# Patient Record
Sex: Male | Born: 1940 | Race: White | Hispanic: No | Smoking: Never smoker
Health system: Southern US, Community
[De-identification: ages and names within clinical notes are randomized; demographics above are authoritative.]

## PROBLEM LIST (undated history)

## (undated) DIAGNOSIS — E785 Hyperlipidemia, unspecified: Secondary | ICD-10-CM

## (undated) DIAGNOSIS — Z9289 Personal history of other medical treatment: Secondary | ICD-10-CM

## (undated) DIAGNOSIS — I719 Aortic aneurysm of unspecified site, without rupture: Secondary | ICD-10-CM

## (undated) DIAGNOSIS — I4892 Unspecified atrial flutter: Secondary | ICD-10-CM

## (undated) DIAGNOSIS — M1990 Unspecified osteoarthritis, unspecified site: Secondary | ICD-10-CM

## (undated) DIAGNOSIS — I4891 Unspecified atrial fibrillation: Secondary | ICD-10-CM

## (undated) DIAGNOSIS — R918 Other nonspecific abnormal finding of lung field: Secondary | ICD-10-CM

## (undated) DIAGNOSIS — Q87410 Marfan's syndrome with aortic dilation: Secondary | ICD-10-CM

## (undated) DIAGNOSIS — I10 Essential (primary) hypertension: Secondary | ICD-10-CM

## (undated) HISTORY — DX: Hyperlipidemia, unspecified: E78.5

## (undated) HISTORY — DX: Marfan syndrome with aortic dilation: Q87.410

## (undated) HISTORY — DX: Unspecified atrial fibrillation: I48.91

## (undated) HISTORY — DX: Essential (primary) hypertension: I10

## (undated) HISTORY — DX: Aortic aneurysm of unspecified site, without rupture: I71.9

## (undated) HISTORY — DX: Unspecified osteoarthritis, unspecified site: M19.90

## (undated) HISTORY — DX: Unspecified atrial flutter: I48.92

## (undated) HISTORY — DX: Other nonspecific abnormal finding of lung field: R91.8

## (undated) HISTORY — PX: VASCULAR SURGERY: SHX849

## (undated) HISTORY — PX: CORONARY ARTERY BYPASS GRAFT: SHX141

## (undated) HISTORY — PX: CARDIAC VALVE REPLACEMENT: SHX585

## (undated) HISTORY — PX: KIDNEY SURGERY: SHX687

---

## 1992-03-09 DIAGNOSIS — I719 Aortic aneurysm of unspecified site, without rupture: Secondary | ICD-10-CM

## 1992-03-09 HISTORY — DX: Aortic aneurysm of unspecified site, without rupture: I71.9

## 1992-03-09 HISTORY — PX: HIP FRACTURE SURGERY: SHX118

## 1993-03-09 HISTORY — PX: AORTIC VALVE REPLACEMENT: SHX41

## 1997-10-01 ENCOUNTER — Ambulatory Visit (HOSPITAL_COMMUNITY): Admission: RE | Admit: 1997-10-01 | Discharge: 1997-10-01 | Payer: Self-pay | Admitting: Cardiovascular Disease

## 2003-04-28 ENCOUNTER — Emergency Department (HOSPITAL_COMMUNITY): Admission: EM | Admit: 2003-04-28 | Discharge: 2003-04-28 | Payer: Self-pay | Admitting: Emergency Medicine

## 2003-08-23 ENCOUNTER — Encounter: Admission: RE | Admit: 2003-08-23 | Discharge: 2003-08-23 | Payer: Self-pay | Admitting: Family Medicine

## 2003-08-24 ENCOUNTER — Encounter: Admission: RE | Admit: 2003-08-24 | Discharge: 2003-08-24 | Payer: Self-pay | Admitting: Family Medicine

## 2003-09-25 ENCOUNTER — Encounter: Admission: RE | Admit: 2003-09-25 | Discharge: 2003-09-25 | Payer: Self-pay | Admitting: Family Medicine

## 2003-10-25 ENCOUNTER — Encounter: Admission: RE | Admit: 2003-10-25 | Discharge: 2003-10-25 | Payer: Self-pay | Admitting: Sports Medicine

## 2003-11-26 ENCOUNTER — Ambulatory Visit: Payer: Self-pay | Admitting: Family Medicine

## 2003-12-24 ENCOUNTER — Ambulatory Visit: Payer: Self-pay | Admitting: Sports Medicine

## 2004-01-24 ENCOUNTER — Ambulatory Visit: Payer: Self-pay | Admitting: Sports Medicine

## 2004-02-21 ENCOUNTER — Ambulatory Visit: Payer: Self-pay | Admitting: Family Medicine

## 2004-03-20 ENCOUNTER — Ambulatory Visit: Payer: Self-pay | Admitting: Family Medicine

## 2004-04-17 ENCOUNTER — Ambulatory Visit: Payer: Self-pay | Admitting: Family Medicine

## 2004-05-22 ENCOUNTER — Ambulatory Visit: Payer: Self-pay | Admitting: Family Medicine

## 2004-06-19 ENCOUNTER — Ambulatory Visit: Payer: Self-pay | Admitting: Sports Medicine

## 2004-07-15 ENCOUNTER — Ambulatory Visit: Payer: Self-pay | Admitting: Sports Medicine

## 2004-08-12 ENCOUNTER — Ambulatory Visit: Payer: Self-pay | Admitting: Family Medicine

## 2004-09-10 ENCOUNTER — Ambulatory Visit: Payer: Self-pay | Admitting: Family Medicine

## 2004-09-17 ENCOUNTER — Ambulatory Visit: Payer: Self-pay | Admitting: Family Medicine

## 2004-09-30 ENCOUNTER — Ambulatory Visit: Payer: Self-pay | Admitting: Sports Medicine

## 2004-10-28 ENCOUNTER — Ambulatory Visit: Payer: Self-pay | Admitting: Sports Medicine

## 2004-11-25 ENCOUNTER — Ambulatory Visit: Payer: Self-pay | Admitting: Sports Medicine

## 2004-12-23 ENCOUNTER — Ambulatory Visit: Payer: Self-pay | Admitting: Family Medicine

## 2005-01-08 ENCOUNTER — Ambulatory Visit: Payer: Self-pay | Admitting: Family Medicine

## 2005-01-13 ENCOUNTER — Ambulatory Visit: Payer: Self-pay | Admitting: Family Medicine

## 2005-01-27 ENCOUNTER — Ambulatory Visit: Payer: Self-pay | Admitting: Family Medicine

## 2005-02-24 ENCOUNTER — Ambulatory Visit: Payer: Self-pay | Admitting: Sports Medicine

## 2005-03-11 ENCOUNTER — Ambulatory Visit: Payer: Self-pay | Admitting: Family Medicine

## 2005-03-18 ENCOUNTER — Ambulatory Visit: Payer: Self-pay | Admitting: Family Medicine

## 2005-03-31 ENCOUNTER — Ambulatory Visit: Payer: Self-pay | Admitting: Sports Medicine

## 2005-04-21 ENCOUNTER — Ambulatory Visit: Payer: Self-pay | Admitting: Sports Medicine

## 2005-05-19 ENCOUNTER — Ambulatory Visit: Payer: Self-pay | Admitting: Family Medicine

## 2005-06-16 ENCOUNTER — Ambulatory Visit: Payer: Self-pay | Admitting: Family Medicine

## 2005-07-14 ENCOUNTER — Ambulatory Visit: Payer: Self-pay | Admitting: Family Medicine

## 2005-08-14 ENCOUNTER — Ambulatory Visit: Payer: Self-pay | Admitting: Family Medicine

## 2005-09-15 ENCOUNTER — Ambulatory Visit: Payer: Self-pay | Admitting: Sports Medicine

## 2005-09-30 ENCOUNTER — Ambulatory Visit: Payer: Self-pay | Admitting: Family Medicine

## 2005-10-15 ENCOUNTER — Encounter: Admission: RE | Admit: 2005-10-15 | Discharge: 2005-10-15 | Payer: Self-pay | Admitting: Sports Medicine

## 2005-10-15 ENCOUNTER — Ambulatory Visit: Payer: Self-pay | Admitting: Family Medicine

## 2005-11-01 HISTORY — PX: OTHER SURGICAL HISTORY: SHX169

## 2005-11-02 ENCOUNTER — Encounter: Admission: RE | Admit: 2005-11-02 | Discharge: 2005-11-02 | Payer: Self-pay | Admitting: Gastroenterology

## 2005-11-02 ENCOUNTER — Encounter: Payer: Self-pay | Admitting: Family Medicine

## 2005-11-12 ENCOUNTER — Ambulatory Visit: Payer: Self-pay | Admitting: Family Medicine

## 2005-11-26 ENCOUNTER — Ambulatory Visit: Payer: Self-pay | Admitting: Family Medicine

## 2005-12-07 ENCOUNTER — Ambulatory Visit: Payer: Self-pay | Admitting: Family Medicine

## 2005-12-15 ENCOUNTER — Ambulatory Visit: Payer: Self-pay | Admitting: Family Medicine

## 2005-12-15 ENCOUNTER — Ambulatory Visit (HOSPITAL_COMMUNITY): Admission: RE | Admit: 2005-12-15 | Discharge: 2005-12-15 | Payer: Self-pay | Admitting: Family Medicine

## 2005-12-16 ENCOUNTER — Ambulatory Visit: Payer: Self-pay | Admitting: Cardiology

## 2005-12-22 ENCOUNTER — Encounter: Payer: Self-pay | Admitting: Cardiology

## 2005-12-22 ENCOUNTER — Ambulatory Visit: Payer: Self-pay | Admitting: Cardiology

## 2005-12-22 ENCOUNTER — Ambulatory Visit (HOSPITAL_COMMUNITY): Admission: RE | Admit: 2005-12-22 | Discharge: 2005-12-22 | Payer: Self-pay | Admitting: Cardiology

## 2006-01-06 ENCOUNTER — Ambulatory Visit: Payer: Self-pay | Admitting: Family Medicine

## 2006-02-03 ENCOUNTER — Ambulatory Visit: Payer: Self-pay | Admitting: Family Medicine

## 2006-02-17 ENCOUNTER — Ambulatory Visit: Payer: Self-pay | Admitting: Sports Medicine

## 2006-03-04 ENCOUNTER — Ambulatory Visit: Payer: Self-pay | Admitting: Family Medicine

## 2006-03-26 ENCOUNTER — Ambulatory Visit: Payer: Self-pay | Admitting: Family Medicine

## 2006-04-23 ENCOUNTER — Ambulatory Visit: Payer: Self-pay | Admitting: Family Medicine

## 2006-05-21 ENCOUNTER — Ambulatory Visit: Payer: Self-pay | Admitting: Family Medicine

## 2006-05-21 LAB — CONVERTED CEMR LAB
INR: 2.6
Prothrombin Time: 31.4 s

## 2006-06-18 ENCOUNTER — Ambulatory Visit: Payer: Self-pay | Admitting: Family Medicine

## 2006-06-18 LAB — CONVERTED CEMR LAB

## 2006-07-22 ENCOUNTER — Ambulatory Visit: Payer: Self-pay | Admitting: Sports Medicine

## 2006-07-22 DIAGNOSIS — M1710 Unilateral primary osteoarthritis, unspecified knee: Secondary | ICD-10-CM | POA: Insufficient documentation

## 2006-07-22 DIAGNOSIS — E7800 Pure hypercholesterolemia, unspecified: Secondary | ICD-10-CM | POA: Insufficient documentation

## 2006-08-19 ENCOUNTER — Ambulatory Visit: Payer: Self-pay | Admitting: Family Medicine

## 2006-08-19 LAB — CONVERTED CEMR LAB: INR: 3.2

## 2006-09-16 ENCOUNTER — Ambulatory Visit: Payer: Self-pay | Admitting: Family Medicine

## 2006-10-14 ENCOUNTER — Ambulatory Visit: Payer: Self-pay | Admitting: Family Medicine

## 2006-10-14 ENCOUNTER — Encounter (INDEPENDENT_AMBULATORY_CARE_PROVIDER_SITE_OTHER): Payer: Self-pay | Admitting: Family Medicine

## 2006-10-14 LAB — CONVERTED CEMR LAB
Alkaline Phosphatase: 90 units/L (ref 39–117)
BUN: 18 mg/dL (ref 6–23)
CO2: 26 meq/L (ref 19–32)
Cholesterol: 177 mg/dL (ref 0–200)
Creatinine, Ser: 0.88 mg/dL (ref 0.40–1.50)
Glucose, Bld: 89 mg/dL (ref 70–99)
HCT: 46.4 % (ref 39.0–52.0)
HDL: 39 mg/dL — ABNORMAL LOW (ref >39)
Hemoglobin: 15.3 g/dL (ref 13.0–17.0)
LDL Cholesterol: 107 mg/dL — ABNORMAL HIGH (ref 0–99)
MCHC: 33 g/dL (ref 30.0–36.0)
MCV: 90.6 fL (ref 78.0–100.0)
RBC: 5.12 M/uL (ref 4.22–5.81)
Total Bilirubin: 0.8 mg/dL (ref 0.3–1.2)
Triglycerides: 155 mg/dL — ABNORMAL HIGH (ref <150)
VLDL: 31 mg/dL (ref 0–40)
WBC: 5.6 10*3/uL (ref 4.0–10.5)

## 2006-10-21 ENCOUNTER — Ambulatory Visit: Payer: Self-pay | Admitting: Family Medicine

## 2006-10-21 ENCOUNTER — Encounter: Admission: RE | Admit: 2006-10-21 | Discharge: 2006-10-21 | Payer: Self-pay | Admitting: Family Medicine

## 2006-11-05 ENCOUNTER — Ambulatory Visit: Payer: Self-pay | Admitting: Sports Medicine

## 2006-11-11 ENCOUNTER — Ambulatory Visit: Payer: Self-pay | Admitting: Family Medicine

## 2006-12-07 ENCOUNTER — Ambulatory Visit: Payer: Self-pay | Admitting: Family Medicine

## 2007-01-04 ENCOUNTER — Ambulatory Visit: Payer: Self-pay | Admitting: Family Medicine

## 2007-01-04 LAB — CONVERTED CEMR LAB: INR: 3.2

## 2007-01-18 ENCOUNTER — Ambulatory Visit: Payer: Self-pay | Admitting: Cardiology

## 2007-01-18 LAB — CONVERTED CEMR LAB
CO2: 32 meq/L (ref 19–32)
Chloride: 101 meq/L (ref 96–112)
Creatinine, Ser: 0.8 mg/dL (ref 0.4–1.5)
Potassium: 4.2 meq/L (ref 3.5–5.1)
Sodium: 139 meq/L (ref 135–145)

## 2007-01-20 ENCOUNTER — Ambulatory Visit (HOSPITAL_COMMUNITY): Admission: RE | Admit: 2007-01-20 | Discharge: 2007-01-20 | Payer: Self-pay | Admitting: Cardiology

## 2007-02-01 ENCOUNTER — Ambulatory Visit: Payer: Self-pay | Admitting: Family Medicine

## 2007-02-01 LAB — CONVERTED CEMR LAB: INR: 4.1

## 2007-02-15 ENCOUNTER — Ambulatory Visit: Payer: Self-pay | Admitting: Family Medicine

## 2007-02-15 LAB — CONVERTED CEMR LAB: INR: 3.4

## 2007-03-15 ENCOUNTER — Ambulatory Visit: Payer: Self-pay | Admitting: Family Medicine

## 2007-04-04 ENCOUNTER — Telehealth (INDEPENDENT_AMBULATORY_CARE_PROVIDER_SITE_OTHER): Payer: Self-pay | Admitting: Family Medicine

## 2007-04-12 ENCOUNTER — Ambulatory Visit: Payer: Self-pay | Admitting: Family Medicine

## 2007-04-12 LAB — CONVERTED CEMR LAB: INR: 3.1

## 2007-04-18 ENCOUNTER — Ambulatory Visit: Payer: Self-pay | Admitting: Family Medicine

## 2007-04-18 DIAGNOSIS — Q8740 Marfan's syndrome, unspecified: Secondary | ICD-10-CM

## 2007-05-11 ENCOUNTER — Ambulatory Visit: Payer: Self-pay | Admitting: Family Medicine

## 2007-05-11 LAB — CONVERTED CEMR LAB: INR: 3

## 2007-05-20 ENCOUNTER — Ambulatory Visit (HOSPITAL_COMMUNITY): Admission: RE | Admit: 2007-05-20 | Discharge: 2007-05-20 | Payer: Self-pay | Admitting: Family Medicine

## 2007-05-20 ENCOUNTER — Ambulatory Visit: Payer: Self-pay | Admitting: Family Medicine

## 2007-05-20 ENCOUNTER — Encounter (INDEPENDENT_AMBULATORY_CARE_PROVIDER_SITE_OTHER): Payer: Self-pay | Admitting: Family Medicine

## 2007-05-20 LAB — CONVERTED CEMR LAB: TSH: 1.147 microintl units/mL (ref 0.350–5.50)

## 2007-05-22 ENCOUNTER — Encounter (INDEPENDENT_AMBULATORY_CARE_PROVIDER_SITE_OTHER): Payer: Self-pay | Admitting: Family Medicine

## 2007-05-23 ENCOUNTER — Encounter (INDEPENDENT_AMBULATORY_CARE_PROVIDER_SITE_OTHER): Payer: Self-pay | Admitting: Family Medicine

## 2007-06-08 ENCOUNTER — Ambulatory Visit: Payer: Self-pay | Admitting: Family Medicine

## 2007-07-06 ENCOUNTER — Ambulatory Visit: Payer: Self-pay | Admitting: Family Medicine

## 2007-07-06 LAB — CONVERTED CEMR LAB: INR: 3.8

## 2007-07-27 ENCOUNTER — Encounter (INDEPENDENT_AMBULATORY_CARE_PROVIDER_SITE_OTHER): Payer: Self-pay | Admitting: Family Medicine

## 2007-08-03 ENCOUNTER — Encounter (INDEPENDENT_AMBULATORY_CARE_PROVIDER_SITE_OTHER): Payer: Self-pay | Admitting: Family Medicine

## 2007-08-03 ENCOUNTER — Ambulatory Visit: Payer: Self-pay | Admitting: Family Medicine

## 2007-08-03 LAB — CONVERTED CEMR LAB
BUN: 20 mg/dL (ref 6–23)
CO2: 23 meq/L (ref 19–32)
Calcium: 8.6 mg/dL (ref 8.4–10.5)
Chloride: 106 meq/L (ref 96–112)
Creatinine, Ser: 0.77 mg/dL (ref 0.40–1.50)
INR: 3.6

## 2007-08-08 ENCOUNTER — Ambulatory Visit: Payer: Self-pay | Admitting: Cardiovascular Disease

## 2007-08-24 ENCOUNTER — Ambulatory Visit: Payer: Self-pay | Admitting: Family Medicine

## 2007-08-26 ENCOUNTER — Ambulatory Visit: Payer: Self-pay | Admitting: Family Medicine

## 2007-08-31 ENCOUNTER — Ambulatory Visit: Payer: Self-pay | Admitting: Family Medicine

## 2007-09-14 ENCOUNTER — Ambulatory Visit: Payer: Self-pay | Admitting: Family Medicine

## 2007-10-12 ENCOUNTER — Ambulatory Visit: Payer: Self-pay | Admitting: Family Medicine

## 2007-11-09 ENCOUNTER — Ambulatory Visit: Payer: Self-pay | Admitting: Family Medicine

## 2007-12-07 ENCOUNTER — Ambulatory Visit: Payer: Self-pay | Admitting: Family Medicine

## 2007-12-07 LAB — CONVERTED CEMR LAB: INR: 3

## 2008-01-04 ENCOUNTER — Ambulatory Visit: Payer: Self-pay | Admitting: Family Medicine

## 2008-01-04 LAB — CONVERTED CEMR LAB: INR: 2.9

## 2008-02-01 ENCOUNTER — Ambulatory Visit: Payer: Self-pay | Admitting: Family Medicine

## 2008-02-20 ENCOUNTER — Ambulatory Visit: Payer: Self-pay | Admitting: Family Medicine

## 2008-02-21 ENCOUNTER — Telehealth: Payer: Self-pay | Admitting: *Deleted

## 2008-02-22 ENCOUNTER — Ambulatory Visit (HOSPITAL_COMMUNITY): Admission: RE | Admit: 2008-02-22 | Discharge: 2008-02-22 | Payer: Self-pay | Admitting: Family Medicine

## 2008-02-27 ENCOUNTER — Ambulatory Visit: Payer: Self-pay | Admitting: Family Medicine

## 2008-03-06 ENCOUNTER — Encounter: Payer: Self-pay | Admitting: Family Medicine

## 2008-03-06 ENCOUNTER — Ambulatory Visit: Payer: Self-pay | Admitting: Family Medicine

## 2008-03-06 LAB — CONVERTED CEMR LAB
ALT: 15 units/L (ref 0–53)
Albumin: 3.8 g/dL (ref 3.5–5.2)
CO2: 26 meq/L (ref 19–32)
Calcium: 8.8 mg/dL (ref 8.4–10.5)
Chloride: 107 meq/L (ref 96–112)
Cholesterol: 179 mg/dL (ref 0–200)
Folate: >20 ng/mL
Glucose, Bld: 93 mg/dL (ref 70–99)
INR: 2.2
PSA: 1.3 ng/mL (ref 0.10–4.00)
Platelets: 142 10*3/uL — ABNORMAL LOW (ref 150–400)
Potassium: 4.2 meq/L (ref 3.5–5.3)
RBC: 5.37 M/uL (ref 4.22–5.81)
Sodium: 140 meq/L (ref 135–145)
Total Protein: 6.9 g/dL (ref 6.0–8.3)
VLDL: 25 mg/dL (ref 0–40)
Vitamin B-12: 724 pg/mL (ref 211–911)
WBC: 5.2 10*3/uL (ref 4.0–10.5)

## 2008-03-21 ENCOUNTER — Ambulatory Visit: Payer: Self-pay | Admitting: Family Medicine

## 2008-03-21 LAB — CONVERTED CEMR LAB: INR: 3.1

## 2008-03-30 ENCOUNTER — Encounter: Payer: Self-pay | Admitting: Family Medicine

## 2008-04-03 ENCOUNTER — Encounter: Payer: Self-pay | Admitting: *Deleted

## 2008-04-18 ENCOUNTER — Ambulatory Visit: Payer: Self-pay | Admitting: Family Medicine

## 2008-05-14 ENCOUNTER — Ambulatory Visit: Payer: Self-pay | Admitting: Family Medicine

## 2008-05-14 LAB — CONVERTED CEMR LAB: INR: 3.3

## 2008-06-13 ENCOUNTER — Ambulatory Visit: Payer: Self-pay | Admitting: Family Medicine

## 2008-06-13 LAB — CONVERTED CEMR LAB: INR: 3.4

## 2008-07-11 ENCOUNTER — Ambulatory Visit: Payer: Self-pay | Admitting: Family Medicine

## 2008-07-11 LAB — CONVERTED CEMR LAB: INR: 2.9

## 2008-08-09 ENCOUNTER — Ambulatory Visit: Payer: Self-pay | Admitting: Family Medicine

## 2008-09-06 ENCOUNTER — Ambulatory Visit: Payer: Self-pay | Admitting: Family Medicine

## 2008-09-06 LAB — CONVERTED CEMR LAB: INR: 3.8

## 2008-09-26 ENCOUNTER — Ambulatory Visit: Payer: Self-pay | Admitting: Family Medicine

## 2008-09-26 LAB — CONVERTED CEMR LAB: INR: 3.9

## 2008-10-10 ENCOUNTER — Ambulatory Visit: Payer: Self-pay | Admitting: Family Medicine

## 2008-10-10 LAB — CONVERTED CEMR LAB: INR: 3.1

## 2008-10-29 ENCOUNTER — Encounter: Payer: Self-pay | Admitting: Family Medicine

## 2008-10-31 ENCOUNTER — Ambulatory Visit: Payer: Self-pay | Admitting: Family Medicine

## 2008-11-28 ENCOUNTER — Ambulatory Visit: Payer: Self-pay | Admitting: Family Medicine

## 2008-12-26 ENCOUNTER — Ambulatory Visit: Payer: Self-pay | Admitting: Family Medicine

## 2008-12-26 LAB — CONVERTED CEMR LAB: INR: 2.8

## 2009-01-23 ENCOUNTER — Ambulatory Visit: Payer: Self-pay | Admitting: Family Medicine

## 2009-01-23 LAB — CONVERTED CEMR LAB: INR: 2.5

## 2009-02-20 ENCOUNTER — Ambulatory Visit: Payer: Self-pay | Admitting: Family Medicine

## 2009-02-20 LAB — CONVERTED CEMR LAB: INR: 2.8

## 2009-03-20 ENCOUNTER — Ambulatory Visit: Payer: Self-pay | Admitting: Family Medicine

## 2009-03-20 LAB — CONVERTED CEMR LAB: INR: 2.7

## 2009-04-10 ENCOUNTER — Encounter: Payer: Self-pay | Admitting: Family Medicine

## 2009-04-10 ENCOUNTER — Ambulatory Visit: Payer: Self-pay | Admitting: Family Medicine

## 2009-04-10 DIAGNOSIS — M170 Bilateral primary osteoarthritis of knee: Secondary | ICD-10-CM

## 2009-04-10 LAB — CONVERTED CEMR LAB

## 2009-04-17 ENCOUNTER — Encounter: Payer: Self-pay | Admitting: Family Medicine

## 2009-04-17 ENCOUNTER — Ambulatory Visit: Payer: Self-pay | Admitting: Family Medicine

## 2009-04-17 LAB — CONVERTED CEMR LAB: INR: 2.7

## 2009-04-18 ENCOUNTER — Encounter: Payer: Self-pay | Admitting: Family Medicine

## 2009-04-18 LAB — CONVERTED CEMR LAB
ALT: 15 units/L (ref 0–53)
AST: 23 units/L (ref 0–37)
Alkaline Phosphatase: 91 units/L (ref 39–117)
Cholesterol: 177 mg/dL (ref 0–200)
Creatinine, Ser: 0.86 mg/dL (ref 0.40–1.50)
HCT: 46.7 % (ref 39.0–52.0)
MCHC: 31.7 g/dL (ref 30.0–36.0)
MCV: 93.6 fL (ref 78.0–100.0)
PSA: 1.96 ng/mL (ref 0.10–4.00)
Platelets: 150 10*3/uL (ref 150–400)
RDW: 13.9 % (ref 11.5–15.5)
Total Bilirubin: 0.7 mg/dL (ref 0.3–1.2)
Total CHOL/HDL Ratio: 4
VLDL: 43 mg/dL — ABNORMAL HIGH (ref 0–40)
Vit D, 25-Hydroxy: 31 ng/mL (ref 30–89)

## 2009-04-29 ENCOUNTER — Ambulatory Visit: Payer: Self-pay | Admitting: Cardiology

## 2009-04-29 DIAGNOSIS — I712 Thoracic aortic aneurysm, without rupture, unspecified: Secondary | ICD-10-CM | POA: Insufficient documentation

## 2009-04-29 DIAGNOSIS — I10 Essential (primary) hypertension: Secondary | ICD-10-CM | POA: Insufficient documentation

## 2009-04-29 DIAGNOSIS — I4891 Unspecified atrial fibrillation: Secondary | ICD-10-CM

## 2009-05-02 ENCOUNTER — Encounter: Payer: Self-pay | Admitting: Family Medicine

## 2009-05-06 ENCOUNTER — Ambulatory Visit: Payer: Self-pay | Admitting: Cardiology

## 2009-05-15 ENCOUNTER — Ambulatory Visit: Payer: Self-pay | Admitting: Family Medicine

## 2009-05-20 ENCOUNTER — Encounter: Payer: Self-pay | Admitting: Cardiology

## 2009-05-20 ENCOUNTER — Ambulatory Visit: Payer: Self-pay

## 2009-05-20 ENCOUNTER — Ambulatory Visit (HOSPITAL_COMMUNITY): Admission: RE | Admit: 2009-05-20 | Discharge: 2009-05-20 | Payer: Self-pay | Admitting: Cardiology

## 2009-05-20 ENCOUNTER — Ambulatory Visit: Payer: Self-pay | Admitting: Cardiology

## 2009-05-20 ENCOUNTER — Encounter: Payer: Self-pay | Admitting: Family Medicine

## 2009-05-20 ENCOUNTER — Ambulatory Visit: Payer: Self-pay | Admitting: Internal Medicine

## 2009-05-24 ENCOUNTER — Telehealth (INDEPENDENT_AMBULATORY_CARE_PROVIDER_SITE_OTHER): Payer: Self-pay | Admitting: *Deleted

## 2009-05-29 ENCOUNTER — Ambulatory Visit: Payer: Self-pay | Admitting: Family Medicine

## 2009-06-12 ENCOUNTER — Ambulatory Visit: Payer: Self-pay | Admitting: Family Medicine

## 2009-06-12 LAB — CONVERTED CEMR LAB: INR: 3.1

## 2009-06-20 ENCOUNTER — Encounter (INDEPENDENT_AMBULATORY_CARE_PROVIDER_SITE_OTHER): Payer: Self-pay | Admitting: *Deleted

## 2009-07-03 ENCOUNTER — Ambulatory Visit: Payer: Self-pay | Admitting: Family Medicine

## 2009-07-15 ENCOUNTER — Ambulatory Visit: Payer: Self-pay | Admitting: Cardiology

## 2009-07-15 DIAGNOSIS — I4892 Unspecified atrial flutter: Secondary | ICD-10-CM

## 2009-07-31 ENCOUNTER — Ambulatory Visit: Payer: Self-pay | Admitting: Family Medicine

## 2009-08-28 ENCOUNTER — Ambulatory Visit: Payer: Self-pay | Admitting: Family Medicine

## 2009-08-28 LAB — CONVERTED CEMR LAB: INR: 3.4

## 2009-09-26 ENCOUNTER — Encounter: Payer: Self-pay | Admitting: Family Medicine

## 2009-09-27 ENCOUNTER — Encounter: Payer: Self-pay | Admitting: *Deleted

## 2009-09-27 ENCOUNTER — Ambulatory Visit: Payer: Self-pay | Admitting: Family Medicine

## 2009-10-30 ENCOUNTER — Ambulatory Visit: Payer: Self-pay | Admitting: Family Medicine

## 2009-10-30 LAB — CONVERTED CEMR LAB: INR: 3.3

## 2009-11-27 ENCOUNTER — Encounter: Payer: Self-pay | Admitting: Family Medicine

## 2009-11-27 ENCOUNTER — Ambulatory Visit: Payer: Self-pay | Admitting: Family Medicine

## 2009-11-28 LAB — CONVERTED CEMR LAB
ALT: 14 units/L (ref 0–53)
AST: 24 units/L (ref 0–37)
Albumin: 4.1 g/dL (ref 3.5–5.2)
Alkaline Phosphatase: 80 units/L (ref 39–117)
Glucose, Bld: 89 mg/dL (ref 70–99)
MCHC: 32.9 g/dL (ref 30.0–36.0)
Potassium: 4.3 meq/L (ref 3.5–5.3)
RBC: 4.83 M/uL (ref 4.22–5.81)
RDW: 14 % (ref 11.5–15.5)
Sodium: 140 meq/L (ref 135–145)
Total Protein: 6.4 g/dL (ref 6.0–8.3)

## 2009-12-25 ENCOUNTER — Ambulatory Visit: Payer: Self-pay | Admitting: Family Medicine

## 2009-12-25 ENCOUNTER — Encounter: Payer: Self-pay | Admitting: *Deleted

## 2010-01-15 ENCOUNTER — Encounter: Payer: Self-pay | Admitting: Family Medicine

## 2010-01-24 ENCOUNTER — Ambulatory Visit: Payer: Self-pay | Admitting: Family Medicine

## 2010-01-24 DIAGNOSIS — Z954 Presence of other heart-valve replacement: Secondary | ICD-10-CM | POA: Insufficient documentation

## 2010-01-24 LAB — CONVERTED CEMR LAB: INR: 3.2

## 2010-02-21 ENCOUNTER — Ambulatory Visit: Payer: Self-pay | Admitting: Family Medicine

## 2010-03-07 ENCOUNTER — Ambulatory Visit: Payer: Self-pay | Admitting: Family Medicine

## 2010-03-26 ENCOUNTER — Ambulatory Visit: Admission: RE | Admit: 2010-03-26 | Discharge: 2010-03-26 | Payer: Self-pay | Source: Home / Self Care

## 2010-03-26 LAB — CONVERTED CEMR LAB: INR: 2.5
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
## 2010-04-10 NOTE — Assessment & Plan Note (Signed)
Summary: flu vaccine given   Nurse Visit   Vital Signs:  Patient profile:   70 year old male Temp:     98.5 degrees F  Vitals Entered By: Theresia Lo RN (December 25, 2009 10:43 AM)  Allergies: No Known Drug Allergies  Immunizations Administered:  Influenza Vaccine # 1:    Vaccine Type: Fluvax MCR    Site: right deltoid    Mfr: GlaxoSmithKline    Dose: 0.5 ml    Route: IM    Given by: Theresia Lo RN    Exp. Date: 09/03/2010    Lot #: [id number]    VIS given: 10/01/09 version given December 25, 2009.  Flu Vaccine Consent Questions:    Do you have a history of severe allergic reactions to this vaccine? no    Any prior history of allergic reactions to egg and/or gelatin? no    Do you have a sensitivity to the preservative Thimersol? no    Do you have a past history of Guillan-Barre Syndrome? no    Do you currently have an acute febrile illness? no    Have you ever had a severe reaction to latex? no    Vaccine information given and explained to patient? yes  Orders Added: 1)  Influenza Vaccine MCR [00025] 2)  Administration Flu vaccine - MCR [G0008]      Vital Signs:  Patient profile:   70 year old male Temp:     98.5 degrees F  Vitals Entered By: Theresia Lo RN (December 25, 2009 10:43 AM)

## 2010-04-10 NOTE — Miscellaneous (Signed)
Summary: [place] prob list    Clinical Lists Changes  Problems: Removed problem of NEED PROPHYLACTIC VACCINATION&INOCULATION FLU (ICD-V04.81) Removed problem of SPECIAL SCREENING MALIGNANT NEOPLASM OF PROSTATE (ICD-V76.44) Removed problem of BACK PAIN (ICD-724.5) Removed problem of NEED PROPHYLACTIC VACCINATION&INOCULATION FLU (ICD-V04.81) Removed problem of COUMADIN THERAPY (ICD-V58.61) Removed problem of BRADYCARDIA, CHRONIC (ICD-427.89) Removed problem of STATUS, HEART VALVE REPLACEMENT NEC (ICD-V43.3) Removed problem of PULMONARY NODULE (ICD-518.89) Observations: Added new observation of PAST MED HX: -Aortic aneurysm - 1994 from presumed Marfan`s,  -On chronic coumadin for artificial aortic valve replacement, INR goal 2.5-3.5 -Pulmonary nodules in lung found on Chest CT 6/09.  Will monitor.  CT 02/22/08 showed: 3.5 mm nodule in R middle lobe (stable and not enlarging) and adjacent nodule is obscured but also stable.  CT 05/20/09 1.  Aneurysm involving the proximal descending thoracic aorta is stable in size from prior exam. 2.  Stable pulmonary nodules. 3.  Liver cyst, stable. -Virtual Colonoscopy 10/2005 by [facility].  Pt on chronic coumadin therapy for Aortic valve replacement therefore at high risk for colonoscopy.  Virtual colonsocpy was negative, but cannot see polyps < 5mm.   -Echo 05/2009: EF 55-60%, atrium mildly dilated -Marfan syndrome  -Hypertension -Hyperlipidemia -Atrial Fibrillation/atrial flutter: cardiologist, Dr Jens Som -Osteoarthritis, multiple joints -DJD, both knees (01/15/2010 19:42) Added new observation of PRIMARY MD: CAT TA MD (01/15/2010 19:42)       Past History:  Past Medical History: -Aortic aneurysm - 1994 from presumed Marfan`s,  -On chronic coumadin for artificial aortic valve replacement, INR goal 2.5-3.5 -Pulmonary nodules in lung found on Chest CT 6/09.  Will monitor.  CT 02/22/08 showed: 3.5 mm nodule in R middle lobe (stable and not  enlarging) and adjacent nodule is obscured but also stable.  CT 05/20/09 1.  Aneurysm involving the proximal descending thoracic aorta is stable in size from prior exam. 2.  Stable pulmonary nodules. 3.  Liver cyst, stable. -Virtual Colonoscopy 10/2005 by [facility].  Pt on chronic coumadin therapy for Aortic valve replacement therefore at high risk for colonoscopy.  Virtual colonsocpy was negative, but cannot see polyps < 5mm.   -Echo 05/2009: EF 55-60%, atrium mildly dilated -Marfan syndrome  -Hypertension -Hyperlipidemia -Atrial Fibrillation/atrial flutter: cardiologist, Dr Jens Som -Osteoarthritis, multiple joints -DJD, both knees

## 2010-04-10 NOTE — Letter (Signed)
Summary: Results Follow-up Letter  [facility]  [address]   [place], [place] [address]   Phone: [phone]  Fax: [phone]    04/18/2009  [address] [place], [place]  [address]  Dear Mr. Ackert,   The following are the results of your recent test(s):   Cholesterol  LDL(Bad cholesterol): 90         Your goal is less than:  100       HDL (Good cholesterol):  44      Your goal is more than:  40 _________________________________________________________  Other Tests:  Anemia:  Not anemic  Kidneys:  Normal  Liver:  Normal  Electrolytes:  Normal    Sincerely,  Yaneth Fairbairn MD [facility]           Appended Document: Results Follow-up Letter mailed.

## 2010-04-10 NOTE — Miscellaneous (Signed)
Summary: re: Pneumovax/TS  called pt and lmvm to call back. PT NEEDS TO SCHED. NURSE VISIT FOR PNEUMONIA VACCINATION...   Marland KitchenArlyss Repress CMA,  September 27, 2009 12:30 PM  Clinical Lists Changes

## 2010-04-10 NOTE — Progress Notes (Signed)
   Phone Note Outgoing Call   Call placed by: Deliah Goody, RN,  May 24, 2009 3:31 PM Summary of Call: pt aware that monitor, after review by dr Jens Som shows atrial flutter with controlled rate. no changes at present Deliah Goody, RN  May 24, 2009 3:32 PM

## 2010-04-10 NOTE — Consult Note (Signed)
Summary: [facility]   Heartcare   Imported By: Bradly Bienenstock 05/21/2009 14:33:46  _____________________________________________________________________  External Attachment:    Type:   Image     Comment:   External Document

## 2010-04-10 NOTE — Assessment & Plan Note (Signed)
Summary: f/u cta,echo/d.miller    Primary Provider:  CAT TA MD  CC:  dizziness.  History of Present Illness: Mr. Juan Wilkerson is a  male I initially saw in February of 2011l. In August of 1995 the patient underwent resection of an aortic aneurysm as well as aortic valve replacement with a #27 mm St. Jude valve and aortic root reconstruction. At that time it is noted that he had suspected Marfan's. Echocardiogram in March of 2011 showed normal LV function, biatrial enlargement and a well-functioning prosthetic aortic valve. Last CTA of his aorta was performed in March 2011.  There was mild aneurysmal dilatation of the proximal aspect of the descending aorta measuring 4cm. Previous lung nodules were unchanged.   Abdominal ultrasound in November of 1996 showed no aneurysm. Note when I saw him in February of 2011 he was in atrial flutter. We did schedule a monitor and this revealed atrial flutter with a controlled ventricular response. Since he was last seen he occasionally has mild dyspnea with more extreme activities but not with routine activities. It is relieved with rest. It is not associated with chest pain. There is no orthopnea, PND, palpitations, syncope or bleeding. He occasionally has minimal pedal edema.  Current Medications (verified): 1)  Warfarin Sodium 3 Mg Tabs (Warfarin Sodium) .... Take As Directed 2)  Zocor 40 Mg Tabs (Simvastatin) .... Take 1 Tablet By Mouth At Bedtime 3)  Glucosamine Chondroitin Complx  Tabs (Glucosamine-Chondroit-Vit C-Mn) .... By Mouth Daily 4)  Multivitamins  Caps (Multiple Vitamin) .... By Mouth One Tablet Daily 5)  Tylenol 500 Mg Tabs (Acetaminophen) .... 2 Tablets By Mouth Every Three Times A Day As Needed Pain 6)  Chlorpheniramine Maleate 4 Mg Tabs (Chlorpheniramine Maleate) .... As Needed For Skin Rash 7)  Hydrocortisone 1 % Crea (Hydrocortisone) .... Apply As Needed For Itching 8)  Amlodipine Besylate 10 Mg Tabs (Amlodipine Besylate) .... One Daily For Blood  Pressure 9)  Sarna 0.5-0.5 % Lotn (Camphor-Menthol) .... As Needed For Skin  Allergies: No Known Drug Allergies  Past History:  Past Medical History: -Aortic aneurysm - 1994 from presumed Marfan`s,  -On chronic coumadin for artificial aortic valve, INR goal 2.5-3.5 -Pulmonary nodules in lung found on Chest CT 6/09.  Will monitor.  CT 02/22/08 showed: 3.5 mm nodule in R middle lobe (stable and not enlarging) and adjacent nodule is obscured but also stable. -Virtual Colonoscopy 10/2005 by [facility].  Pt on chronic coumadin therapy for Aortic valve replacement therefore at high risk for colonoscopy.  Virtual colonsocpy was negative, but cannot see polyps < 5mm.   -Echo 05/2009: EF 55-60%, atrium mildly dilated  Hypertension Hyperlipidemia Atrial Fibrillation/atrial flutter: cardiologist, Dr Jens Som Osteoarthritis  Past Surgical History: Reviewed history from 05/21/2009 and no changes required. Aortic Valve replacement - 03/09/1993, St Jude Valve Hip surgery for R hip fracture - 03/09/1992  Social History: Reviewed history from 04/10/2009 and no changes required. Divorced, lives alone. Has a 25 y/o daughter (pt does not have contact with her, has not seen her since she was 48) . 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 No smoking. No alcohol use. No recreational drugs.  Review of Systems       no fevers or chills, productive cough, hemoptysis, dysphasia, odynophagia, melena, hematochezia, dysuria, hematuria, rash, seizure activity, orthopnea, PND, pedal edema, claudication. Remaining systems are negative.   Vital Signs:  Patient profile:   70 year old male Height:      76 inches Weight:      206  pounds BMI:     25.17 Pulse rate:   76 / minute Resp:     14 per minute BP sitting:   150 / 90  (left arm)  Vitals Entered By: [name] (Jul 15, 2009 3:15 PM)  Physical Exam  General:  Well-developed well-nourished in no acute distress.  Skin is warm and dry.  HEENT is normal.    Neck is supple. No thyromegaly.  Chest is clear to auscultation with normal expansion.  Cardiovascular exam is regular rate and rhythm. crisp mechanical valve sounds Abdominal exam nontender or distended. No masses palpated. Extremities show no edema. neuro grossly intact    EKG  Procedure date:  07/15/2009  Findings:      Atrial flutter at a rate of 76. Left anterior fascicular block. RV conduction delay.  Impression & Recommendations:  Problem # 1:  ATRIAL FLUTTER (ICD-427.32) Patient remains in atrial flutter. However he is not having symptoms and his rate is controlled on his Holter monitor. He also will require lifelong Coumadin for his prosthetic aortic valve. I therefore do not think intervention is required. His updated medication list for this problem includes:    Warfarin Sodium 3 Mg Tabs (Warfarin sodium) .Marland Kitchen... 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  Problem # 3:  ESSENTIAL HYPERTENSION, BENIGN (ICD-401.1) Blood pressure mildly elevated. I've asked him to follow this and we will add additional medications as needed. He states it typically is normal. His updated medication list for this problem includes:    Amlodipine Besylate 10 Mg Tabs (Amlodipine besylate) ..... One daily for blood pressure  Problem # 4:  COUMADIN THERAPY (ICD-V58.61) Goal INR 2.5-3.5.  Problem # 5:  HYPERCHOLESTEROLEMIA (ICD-272.0) Continue statin. His updated medication list for this problem includes:    Zocor 40 Mg Tabs (Simvastatin) .Marland Kitchen... 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  Problem # 7:  PULMONARY NODULE (ICD-518.89) Followup CT showed no change.  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

## 2010-04-10 NOTE — Miscellaneous (Signed)
Summary: Repeat Virtual Colonoscopy 10/2010   Clinical Lists Changes  Observations: Added new observation of COLONNXTDUE: 11/04/2010 (05/02/2009 8:48) Added new observation of DM PROGRESS: N/A (05/02/2009 8:48) Added new observation of DM FSREVIEW: N/A (05/02/2009 8:48) Added new observation of PRIMARY MD: Juan Top MD (05/02/2009 8:48)    Precepted with Juan Wilkerson.  Pt is a 70 y/o M with pmhx of aortic aneurysm in 1994 presumably from Marfan's Synd.  Pt had aortic valve replacement and needs lifetime coumadin.  Pt had Virtual Colonosopy in 2007 that was negative, though it cannot identify polyps < 5 mm.  I would like to repeat virtual ct colonoscopy 5 yrs from the time it was done, which would be 10/2010. I spoke with Juan Wilkerson today regarding this.  He is in agreement to repeating it in 10/2010.  Juan Tovey MD  May 02, 2009 8:56 AM      Prevention & Chronic Care Immunizations   Influenza vaccine: Fluvax MCR  (12/26/2008)   Influenza vaccine due: 12/06/2008    Tetanus booster: 09/11/2004: Done.   Tetanus booster due: 09/12/2014    Pneumococcal vaccine: Not documented    H. zoster vaccine: Not documented  Colorectal Screening   Hemoccult: Done.  (01/12/2005)   Hemoccult due: 01/12/2006    Colonoscopy: Results: Normal.   (11/03/2005)   Colonoscopy action/deferral: Virtual CT Colonoscopy done at [facility]  Pt is on coumadin therapy for Aortic valve replacement and is high risk for colonoscopy. Virtual colonoscopy does not see polyps < 5 mm.  (11/03/2005)   Colonoscopy due: 11/04/2010  Other Screening   PSA: 1.96  (04/17/2009)   PSA action/deferral: Discussed-PSA requested  (04/10/2009)   Smoking status: never  (04/10/2009)  Lipids   Total Cholesterol: 177  (04/17/2009)   LDL: 90  (04/17/2009)   LDL Direct: Not documented   HDL: 44  (04/17/2009)   Triglycerides: 214  (04/17/2009)    SGOT (AST): 23  (04/17/2009)   SGPT (ALT): 15  (04/17/2009)   Alkaline phosphatase:  91  (04/17/2009)   Total bilirubin: 0.7  (04/17/2009)  Hypertension   Last Blood Pressure: 153 / 92  (04/29/2009)   Serum creatinine: 0.86  (04/17/2009)   Serum potassium 4.2  (04/17/2009)  Self-Management Support :   Personal Goals (by the next clinic visit) :      Personal blood pressure goal: 130/80  (04/10/2009)     Personal LDL goal: 100  (04/10/2009)    Hypertension self-management support: Written self-care plan  (04/10/2009)    Lipid self-management support: Not documented

## 2010-04-10 NOTE — Letter (Signed)
Summary: *Referral Letter: Cardiology  [facility]  [address]   [place], [place] [address]   Phone: [phone]  Fax: [phone]    04/10/2009  Thank you in advance for agreeing to see my patient:  Juan Wilkerson [address] [place], [place]  [address]  Phone: [phone]  Reason for Referral:  70 y/o Male with history of Aortic Aneurysm in 1994 presumed to be secondary to Marfan's Syndrome.  He had Aortic Valvre replacement and has been on chronic coumadin therapy, in therapeutic range.  He also has well controlled hypertension and hyperlipidemia.    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)  Current Medications: 1)  WARFARIN SODIUM 3 MG TABS (WARFARIN SODIUM) Take as directed 2)  ZOCOR 40 MG TABS (SIMVASTATIN) Take 1 tablet by mouth at bedtime 3)  GLUCOSAMINE CHONDROITIN COMPLX  TABS (GLUCOSAMINE-CHONDROIT-VIT C-MN) by mouth daily 4)  MULTIVITAMINS  CAPS (MULTIPLE VITAMIN) by mouth one tablet daily 5)  * TYLENOL 500 MG TABS (ACETAMINOPHEN) 2 tablets by mouth every three times a day as needed pain 6)  CHLORPHENIRAMINE MALEATE 4 MG TABS (CHLORPHENIRAMINE MALEATE) as needed for skin rash 7)  HYDROCORTISONE 1 % CREA (HYDROCORTISONE) apply as needed for itching 8)  AMLODIPINE BESYLATE 10 MG TABS (AMLODIPINE BESYLATE) one daily for blood pressure 9)  SARNA 0.5-0.5 % LOTN (CAMPHOR-MENTHOL) as needed for skin   Past Medical History: 1)  Aortic aneurysm - 1994 from presumed Marfan`s,  2)  On chronic coumadin for artificial aortic valve, INR goal 2.5-3.5 3)  Pulmonary nodules in lung found on Chest CT 6/09.  Will monitor.  CT 02/22/08  showed: 3.5 mm nodule in R middle lobe (stable and not enlarging) and adjacent nodule is obscured but also stable.   Thank you again for agreeing to see our patient; please contact us if you have any further questions or need additional information.  Sincerely,  Jhalen Eley MD

## 2010-04-10 NOTE — Assessment & Plan Note (Signed)
Summary: F/UP,TCB   Vital Signs:  Patient profile:   70 year old male Height:      76 inches Weight:      204 pounds BMI:     24.92 Temp:     97.7 degrees F Pulse rate:   71 / minute BP sitting:   126 / 81  (left arm) Cuff size:   regular  Vitals Entered By: Tessie Fass CMA (April 10, 2009 1:57 PM) CC: F/U Is Patient Diabetic? No Pain Assessment Patient in pain? yes     Location: hips, shoulders, knees Intensity: 5   Primary Care Provider:  Caitrin Pendergraph MD  CC:  F/U.  History of Present Illness: cc: cpe  HYPERTENSION Disease Monitoring: checks when he goes to store, but has been forgetting  Blood pressure range: does not check   Chest pain: no Dyspnea: no Medications: norvasc 10mg  daily Compliance: yes  Lightheadedness: no  Edema: no  Prevention Exercise: not as much as he should, esp since it has been cold Salt restriction:  yes  HLD: taking  zocor 40mg .  no muscle aches/fatigue  DJD:  Pt has dx of severe djd in both knees.  He now complains of similar pains in both hips and shoulders also.  He has been taking tylenol 1000mg  three times a day at most with relief.    Colon cancer screening:  Pt states that he had Virtual Colonoscopy last year at [facility] and was told that everything looked fine.  I have asked pt to request this report to be faxed to me.    Artificial Valve from Aortic Aneurysm 1994:  On chronic coumadin.  INR therapeutic level.  Pt would like cardiology referral.  He was seeing a cardiologist for years who has moved out of [place].  Pt  does not remember his cardiologist's name.  He was told that he should see [facility].    Habits & Providers  Alcohol-Tobacco-Diet     Alcohol drinks/day: 0     Tobacco Status: never  Exercise-Depression-Behavior     Does Patient Exercise: no     Exercise Counseling: to improve exercise regimen     Have you felt down or hopeless? no     Have you felt little pleasure in things? no     Depression  Counseling: not indicated; screening negative for depression     STD Risk: never     Drug Use: never     Seat Belt Use: always     Sun Exposure: infrequent  Current Problems (verified): 1)  Special Screening Malignant Neoplasm of Prostate  (ICD-V76.44) 2)  Need Prophylactic Vaccination&inoculation Flu  (ICD-V04.81) 3)  Urticaria  (ICD-708.9) 4)  Special Screening Malignant Neoplasm of Prostate  (ICD-V76.44) 5)  Numbness  (ICD-782.0) 6)  Pulmonary Nodule  (ICD-518.89) 7)  Bradycardia, Chronic  (ICD-427.89) 8)  Marfan's Syndrome  (ICD-759.82) 9)  Hypertension  (ICD-401.9) 10)  Hypercholesterolemia  (ICD-272.0) 11)  Degenerative Joint Disease, Both Knees, Severe  (ICD-715.96) 12)  Aftercare, Long-term Use, Anticoagulants  (ICD-V58.61) 13)  Status, Heart Valve Replacement Nec  (ICD-V43.3)  Current Medications (verified): 1)  Warfarin Sodium 3 Mg Tabs (Warfarin Sodium) .... Take As Directed 2)  Zocor 40 Mg Tabs (Simvastatin) .... Take 1 Tablet By Mouth At Bedtime 3)  Glucosamine Chondroitin Complx  Tabs (Glucosamine-Chondroit-Vit C-Mn) .... By Mouth Daily 4)  Multivitamins  Caps (Multiple Vitamin) .... By Mouth One Tablet Daily 5)  Tylenol 500 Mg Tabs (Acetaminophen) .... 2 Tablets By  Mouth Every Three Times A Day As Needed Pain 6)  Chlorpheniramine Maleate 4 Mg Tabs (Chlorpheniramine Maleate) .... As Needed For Skin Rash 7)  Hydrocortisone 1 % Crea (Hydrocortisone) .... Apply As Needed For Itching 8)  Amlodipine Besylate 10 Mg Tabs (Amlodipine Besylate) .... One Daily For Blood Pressure. No More Refills Until Pt Is Seen By Pcp 9)  Sarna 0.5-0.5 % Lotn (Camphor-Menthol) .... As Needed For Skin  Allergies (verified): No Known Drug Allergies  Past History:  Past Medical History: Last updated: 03/21/2008 Aortic aneurysm - 1994 from presumed Marfan`s,  On chronic coumadin for artificial aortic valve, INR goal 2.5-3.5 Pulmonary nodules in lung found on Chest CT 6/09.  Will monitor.  CT  02/22/08 showed: 3.5 mm nodule in R middle lobe (stable and not enlarging) and adjacent nodule is obscured but also stable.  Past Surgical History: Last updated: 05/06/2006 Aortic Valve replacement - 03/09/1993, Hip surgery for R hip fracture - 03/09/1992  Family History: Last updated: 02/20/2008 HTN -siblings, Prostate CA 1st degree (brother, had surgery and is ok now) Aunt had aortic aneurysm  Social History: Last updated: 04/10/2009 Divorced, lives alone. Has a 29 y/o daughter (pt does not have contact with her, has not seen her since she was 10) . 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 No smoking. No alcohol use. No recreational drugs.  Risk Factors: Alcohol Use: 0 (04/10/2009) Exercise: no (04/10/2009)  Risk Factors: Smoking Status: never (04/10/2009)  Social History: Divorced, lives alone. Has a 57 y/o daughter (pt does not have contact with her, has not seen her since she was 53) . 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 No smoking. No alcohol use. No recreational drugs.Does Patient Exercise:  no STD Risk:  never Drug Use:  never Seat Belt Use:  always Sun Exposure-Excessive:  infrequent  Physical Exam  General:  Well-developed,well-nourished,in no acute distress; alert,appropriate and cooperative throughout examination. Vitals reviewed.   Head:  normocephalic and atraumatic.   Eyes:  vision grossly intact, pupils equal, pupils round, pupils reactive to light, pupils react to accomodation, and corneas and lenses clear.  eomi Neck:  supple and full ROM.   Lungs:  Normal respiratory effort, chest expands symmetrically. Lungs are clear to auscultation, no crackles or wheezes. Heart:  Normal rate and regular rhythm. S1 and S2 normal without gallop, murmur, click, rub or other extra sounds. Abdomen:  Bowel sounds positive,abdomen soft and non-tender without masses, organomegaly or hernias noted. Msk:  normal ROM, no joint tenderness, and no joint swelling.   Pulses:  R radial normal,  R dorsalis pedis normal, L radial normal, and L dorsalis pedis normal.   Extremities:  No clubbing, cyanosis, edema, or deformity noted with normal full range of motion of all joints.   Neurologic:  No cranial nerve deficits noted. Station and gait are normal. Plantar reflexes are down-going bilaterally. DTRs are symmetrical throughout. Sensory, motor and coordinative functions appear intact. Skin:  Intact without suspicious lesions or rashes Cervical Nodes:  No lymphadenopathy noted Axillary Nodes:  No palpable lymphadenopathy Psych:  Oriented X3.     Impression & Recommendations:  Problem # 1:  HYPERTENSION (ICD-401.9) Assessment Improved BP at goal with increased dose of Norvasc 10mg  daily.  Will continue current med.  Pt to return for labs.   His updated medication list for this problem includes:    Amlodipine Besylate 10 Mg Tabs (Amlodipine besylate) ..... One daily for blood pressure  Orders: [facility]- Est  Level 4 (99214)Future Orders: Comp Met-[facility] ([id number]) .Marland KitchenMarland Kitchen  04/11/2010  Problem # 2:  HYPERCHOLESTEROLEMIA (ICD-272.0) Assessment: Unchanged Pt is due for FLP.  Ive asked him to come back in next 1-2 wks for this.  Will also check LFTs at that time.   His updated medication list for this problem includes:    Zocor 40 Mg Tabs (Simvastatin) .Marland Kitchen... Take 1 tablet by mouth at bedtime  Orders: [facility]- Est  Level 4 (99214)Future Orders: Lipid-[facility] ([id number]) ... 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  Will not work up at this time unless symptoms worsened.   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  Will continue.  Will refer to cardiologist per pt's request (see hpi).   Orders: Cardiology Referral (Cardiology) [facility]- Est  Level 4  ([id number])  Problem # 5:  SPECIAL SCREENING MALIGNANT NEOPLASM OF PROSTATE (ICD-V76.44) Assessment: Unchanged Will check PSA.   Future Orders: PSA-[facility] ([id number]) ... 04/11/2010  Complete Medication List: 1)  Warfarin Sodium 3 Mg Tabs (Warfarin sodium) .... Take as directed 2)  Zocor 40 Mg Tabs (Simvastatin) .... Take 1 tablet by mouth at bedtime 3)  Glucosamine Chondroitin Complx Tabs (Glucosamine-chondroit-vit c-mn) .... By mouth daily 4)  Multivitamins Caps (Multiple vitamin) .... By mouth one tablet daily 5)  Tylenol 500 Mg Tabs (acetaminophen)  .... 2 tablets by mouth every three times a day as needed pain 6)  Chlorpheniramine Maleate 4 Mg Tabs (Chlorpheniramine maleate) .... As needed for skin rash 7)  Hydrocortisone 1 % Crea (Hydrocortisone) .... Apply as needed for itching 8)  Amlodipine Besylate 10 Mg Tabs (Amlodipine besylate) .... One daily for blood pressure 9)  Sarna 0.5-0.5 % Lotn (Camphor-menthol) .... As needed for skin  Other Orders: Future Orders: CBC-[facility] ([id number]) ... 04/12/2010 Vit D, 25 [facility] ([id number]) ... 04/19/2010  Patient Instructions: 1)  Please schedule a follow-up appointment in 3-4 months.  2)  Please make appt for cholesterol check.  This has to be fasting, so no food or drink after midnight the night before appointment. 3)  It is important that you exercise reguarly at least 30 minutes 5 times a week. If you develop chest pain, have severe difficulty breathing, or feel very tired, stop exercising immediately and seek medical attention.  4)  Please have your colonoscopy report faxed to the [facility]. 5)  You can try [facility] for eye exam  Prescriptions: AMLODIPINE BESYLATE 10 MG TABS (AMLODIPINE BESYLATE) one daily for blood pressure  #34 x 5   Entered and Authorized by:   Angeline Slim MD   Signed by:   Angeline Slim MD on 04/10/2009   Method used:   Electronically to        [facility] [phone]* (retail)       [address], [place]  [address]       Ph: [phone] or [phone]       Fax: [phone]   RxID:   [id number] AMLODIPINE BESYLATE 10 MG TABS (AMLODIPINE BESYLATE) one daily for blood pressure. No more refills until pt is seen by PCP  #30 x 5   Entered and Authorized by:   Angeline Slim MD   Signed by:   Angeline Slim MD on 04/10/2009   Method used:  Electronically to        [facility] [phone]* (retail)       [address] [place]  [address]       Ph: [phone] or [phone]       Fax: [phone]   RxID:   [id number] ZOCOR 40 MG TABS (SIMVASTATIN) Take 1 tablet by mouth at bedtime  #34 x 5   Entered and Authorized by:   Angeline Slim MD   Signed by:   Angeline Slim MD on 04/10/2009   Method used:   Electronically to        [facility] [phone]* (retail)       [address] [place]  [address]       Ph: [phone] or [phone]       Fax: [phone]   RxID:   [id number] WARFARIN SODIUM 3 MG TABS (WARFARIN SODIUM) Take as directed  #31 x 5   Entered and Authorized by:   Angeline Slim MD   Signed by:   Angeline Slim MD on 04/10/2009   Method used:   [facility]ron[facility] to        [facility] [phone][facility] (retail)       [address] [place]  [address]       Ph: [phone] or [phone]       Fax: [phone]   RxID:   [id number]    Prevention & Chronic Care Immunizations   Influenza vaccine: Fluvax MCR  (12/26/2008)   Influenza vaccine due: 12/06/2008    Tetanus booster: 09/11/2004: Done.   Tetanus booster due: 09/12/2014    Pneumococcal vaccine: Not documented    H. zoster vaccine: Not documented  Colorectal Screening   Hemoccult: Done.  (01/12/2005)   Hemoccult due: 01/12/2006    Colonoscopy: Not documented  Other Screening   PSA: 1.30  (03/06/2008)   PSA ordered.   PSA action/deferral: Discussed-PSA requested  (04/10/2009)   Smoking status: never  (04/10/2009)  Lipids   Total Cholesterol: 179  (03/06/2008)   LDL: 108   (03/06/2008)   LDL Direct: Not documented   HDL: 46  (03/06/2008)   Triglycerides: 125  (03/06/2008)    SGOT (AST): 22  (03/06/2008)   SGPT (ALT): 15  (03/06/2008) CMP ordered    Alkaline phosphatase: 89  (03/06/2008)   Total bilirubin: 0.8  (03/06/2008)    Lipid flowsheet reviewed?: Yes   Progress toward LDL goal: Unchanged  Hypertension   Last Blood Pressure: 126 / 81  (04/10/2009)   Serum creatinine: 0.84  (03/06/2008)   Serum potassium 4.2  (03/06/2008) CMP ordered     Hypertension flowsheet reviewed?: Yes   Progress toward BP goal: At goal  Self-Management Support :   Personal Goals (by the next clinic visit) :      Personal blood pressure goal: 130/80  (04/10/2009)     Personal LDL goal: 100  (04/10/2009)    Hypertension self-management support: Written self-care plan  (04/10/2009)   Hypertension self-care plan printed.    Lipid self-management support: Not documented

## 2010-04-10 NOTE — Letter (Signed)
Summary: Appointment - Reschedule  [facility]     [place], [place]    Phone:   Fax:      June 20, 2009 MRN: [id number]   [name] [address] [place], [place]  [address]   Dear [name],   Due to a change in our office schedule, your appointment on  07-15-2009 at  3:15 p.m              must be changed.  It is very important that we reach you to reschedule this appointment. We look forward to participating in your health care needs. Please contact us at the number listed above at your earliest convenience to reschedule this appointment.     Sincerely,       Lorne Skeens  [facility] Scheduling Team

## 2010-04-10 NOTE — Assessment & Plan Note (Signed)
Summary: np6/marfan syndrome/hx of aortic aneurysm    Primary Provider:  CAT TA MD  CC:  referal from Dr Deirdre Priest.  History of Present Illness: Juan Wilkerson is a  male previously seen in this practice by Dr. Diona Browner. In August of 1995 the patient underwent resection of an aortic aneurysm as well as aortic valve replacement with a #27 mm St. Jude valve and aortic root reconstruction. At that time it is noted that he had suspected Marfan's. Last CTA of his aorta was performed in June of 2009. At that time there were 2 right middle lobe nodules and followup was recommended in 6 months. There was mild aneurysmal dilatation of the proximal aspect of the descending aorta measuring 4 x 4.7 cm. He has had only intermittent followup since then. He was last seen in this office in November of 2008. Last echocardiogram in October 2007 revealed normal LV function with an ejection fraction 65-70%. There was a prosthetic aortic valve function normally. The left atrium was dilated per the report. Abdominal ultrasound in November of 1996 showed no aneurysm. Since he was last seen he occasionally has mild dyspnea with more extreme activities but not with routine activities. It is relieved with rest. It is not associated with chest pain. There is no orthopnea, PND, palpitations, syncope or bleeding. He occasionally has minimal pedal edema.  Current Medications (verified): 1)  Warfarin Sodium 3 Mg Tabs (Warfarin Sodium) .... Take As Directed 2)  Zocor 40 Mg Tabs (Simvastatin) .... Take 1 Tablet By Mouth At Bedtime 3)  Glucosamine Chondroitin Complx  Tabs (Glucosamine-Chondroit-Vit C-Mn) .... By Mouth Daily 4)  Multivitamins  Caps (Multiple Vitamin) .... By Mouth One Tablet Daily 5)  Tylenol 500 Mg Tabs (Acetaminophen) .... 2 Tablets By Mouth Every Three Times A Day As Needed Pain 6)  Chlorpheniramine Maleate 4 Mg Tabs (Chlorpheniramine Maleate) .... As Needed For Skin Rash 7)  Hydrocortisone 1 % Crea (Hydrocortisone)  .... Apply As Needed For Itching 8)  Amlodipine Besylate 10 Mg Tabs (Amlodipine Besylate) .... One Daily For Blood Pressure 9)  Sarna 0.5-0.5 % Lotn (Camphor-Menthol) .... As Needed For Skin  Allergies: No Known Drug Allergies  Past History:  Past Medical History: -Aortic aneurysm - 1994 from presumed Marfan`s,  -On chronic coumadin for artificial aortic valve, INR goal 2.5-3.5 -Pulmonary nodules in lung found on Chest CT 6/09.  Will monitor.  CT 02/22/08 showed: 3.5 mm nodule in R middle lobe (stable and not enlarging) and adjacent nodule is obscured but also stable. -Virtual Colonoscopy 10/2005 by [facility].  Pt on chronic coumadin therapy for Aortic valve replacement therefore at high risk for colonoscopy.  Virtual colonsocpy was negative, but cannot see polyps < 5mm.   hypertension Hyperlipidemia Osteoarthritis  Past Surgical History: Reviewed history from 05/06/2006 and no changes required. Aortic Valve replacement - 03/09/1993, Hip surgery for R hip fracture - 03/09/1992  Social History: Reviewed history from 04/10/2009 and no changes required. Divorced, lives alone. Has a 37 y/o daughter (pt does not have contact with her, has not seen her since she was 52) . 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 No smoking. No alcohol use. No recreational drugs.  Review of Systems       Problems with arthralgias but no fevers or chills, productive cough, hemoptysis, dysphasia, odynophagia, melena, hematochezia, dysuria, hematuria, rash, seizure activity, orthopnea, PND,  claudication. Remaining systems are negative.   Vital Signs:  Patient profile:   70 year old male Height:  76 inches Weight:      263 pounds BMI:     32.13 Pulse rate:   85 / minute Resp:     14 per minute BP sitting:   153 / 92  (left arm)  Vitals Entered By: [name] (April 29, 2009 12:00 PM)  Physical Exam  General:  Well-developed well-nourished in no acute distress.  Skin is warm and dry.  HEENT  is normal.  Neck is supple. No thyromegaly.  Chest is clear to auscultation with normal expansion.  Cardiovascular exam is irregular, crisp mechanical valve sound, 2/6 systolic murmur, no diastolic murmur. Abdominal exam nontender or distended. No masses palpated. Extremities show no edema. neuro grossly intact    EKG  Procedure date:  04/29/2009  Findings:      Atrial fibrillation at a rate of 85. Left anterior fascicular block. Right bundle branch block. PVCs or aberrantly conducted beats. Her atrial fibrillation is new compared to previous.  Impression & Recommendations:  Problem # 1:  FIBRILLATION, ATRIAL (ICD-427.31)  The patient is in atrial fibrillation which is new. However he does not appear to be symptomatic and the duration is unknown. He also will require Coumadin lifelong for his aortic valve replacement. I therefore do not feel that it is indicated to proceed with cardioversion. His rate appears to be controlled on no medications and he does have a history of bradycardia. I will schedule a 48 hour Holter monitor to make sure that his rate is controlled. If he develops worsening symptoms then we could consider attempts at cardioversion in the future. Check TSH. Check echocardiogram. His updated medication list for this problem includes:    Warfarin Sodium 3 Mg Tabs (Warfarin sodium) .Marland Kitchen... Take as directed  His updated medication list for this problem includes:    Warfarin Sodium 3 Mg Tabs (Warfarin sodium) .Marland Kitchen... Take as directed  Problem # 2:  ESSENTIAL HYPERTENSION, BENIGN (ICD-401.1)  Blood pressure mildly elevated. We will follow this and add additional medications as needed. His updated medication list for this problem includes:    Amlodipine Besylate 10 Mg Tabs (Amlodipine besylate) ..... One daily for blood pressure  His updated medication list for this problem includes:    Amlodipine Besylate 10 Mg Tabs (Amlodipine besylate) ..... One daily for blood  pressure  Problem # 3:  COUMADIN THERAPY (ICD-V58.61) Monitored by the primary care service.  Problem # 4:  HYPERCHOLESTEROLEMIA (ICD-272.0)  Continue statin. Lipids and liver monitored by primary care. His updated medication list for this problem includes:    Zocor 40 Mg Tabs (Simvastatin) .Marland Kitchen... Take 1 tablet by mouth at bedtime  His updated medication list for this problem includes:    Zocor 40 Mg Tabs (Simvastatin) .Marland Kitchen... 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 Check echocardiogram.  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 Repeat CTA of his chest. Note he denies a dye allergy. There is a question of shellfish allergy in the past but he states this is not clear. He has not had a reaction to previous dye.  Other Orders: Echocardiogram (Echo) TLB-TSH (Thyroid Stimulating Hormone) (84443-TSH) Holter (Holter) CT Scan  (CT Scan)  Patient Instructions: 1)  Your physician recommends that you schedule a follow-up appointment in: 6-8 WEEKS 2)  Non-Cardiac CT Angiography (CTA), is a special type of CT scan that uses a computer to produce multi-dimensional views  of major blood vessels throughout the body. In CT angiography, a contrast material is injected through an IV to help visualize the blood vessels. 3)  Your physician has requested that you have an echocardiogram.  Echocardiography is a painless test that uses sound waves to create images of your heart. It provides your doctor with information about the size and shape of your heart and how well your heart's chambers and valves are working.  This procedure takes approximately one hour. There are no restrictions for this procedure. 4)  Your physician has recommended that you wear a holter monitor.  Holter monitors are medical devices that record the heart's electrical activity.  Doctors most often use these monitors to diagnose arrhythmias. Arrhythmias are problems with the speed or rhythm of the heartbeat. The monitor is a small, portable device. You can wear one while you do your normal daily activities. This is usually used to diagnose what is causing palpitations/syncope (passing out).48 HOUR

## 2010-04-10 NOTE — Letter (Signed)
Summary: [facility]   Imported By: Clydell Hakim 04/25/2009 16:37:44  _____________________________________________________________________  External Attachment:    Type:   Image     Comment:   External Document  Appended Document: [facility]   Colonoscopy  Procedure date:  11/03/2005  Findings:      Results: Normal.   Comments:      Virtual CT Colonoscopy done at [facility]  Pt is on coumadin therapy for Aortic valve replacement and is high risk for colonoscopy. Virtual colonoscopy does not see polyps < 5 mm.   Appended Document: [facility]    Past History:  Past Medical History: -Aortic aneurysm - 1994 from presumed Marfan`s,  -On chronic coumadin for artificial aortic valve, INR goal 2.5-3.5 -Pulmonary nodules in lung found on Chest CT 6/09.  Will monitor.  CT 02/22/08 showed: 3.5 mm nodule in R middle lobe (stable and not enlarging) and adjacent nodule is obscured but also stable. -Virtual Colonoscopy 10/2005 by [facility].  Pt on chronic coumadin therapy for Aortic valve replacement therefore at high risk for colonoscopy.  Virtual colonsocpy was negative, but cannot see polyps < 5mm.

## 2010-04-10 NOTE — Miscellaneous (Signed)
Summary: CT chest   Clinical Lists Changes  Observations: Added new observation of PAST MED HX: -Aortic aneurysm - 1994 from presumed Marfan`s,  -On chronic coumadin for artificial aortic valve, INR goal 2.5-3.5 -Pulmonary nodules in lung found on Chest CT 6/09.  Will monitor.  CT 02/22/08 showed: 3.5 mm nodule in R middle lobe (stable and not enlarging) and adjacent nodule is obscured but also stable.  CT 05/20/09 1.  Aneurysm involving the proximal descending thoracic aorta is stable in size from prior exam. 2.  Stable pulmonary nodules. 3.  Liver cyst, stable. -Virtual Colonoscopy 10/2005 by [facility].  Pt on chronic coumadin therapy for Aortic valve replacement therefore at high risk for colonoscopy.  Virtual colonsocpy was negative, but cannot see polyps < 5mm.   -Echo 05/2009: EF 55-60%, atrium mildly dilated -Hypertension -Hyperlipidemia -Atrial Fibrillation/atrial flutter: cardiologist, Dr Jens Som -Osteoarthritis (09/26/2009 21:30) Added new observation of PRIMARY MD: Sparkle Aube MD (09/26/2009 21:30)       Past History:  Past Medical History: -Aortic aneurysm - 1994 from presumed Marfan`s,  -On chronic coumadin for artificial aortic valve, INR goal 2.5-3.5 -Pulmonary nodules in lung found on Chest CT 6/09.  Will monitor.  CT 02/22/08 showed: 3.5 mm nodule in R middle lobe (stable and not enlarging) and adjacent nodule is obscured but also stable.  CT 05/20/09 1.  Aneurysm involving the proximal descending thoracic aorta is stable in size from prior exam. 2.  Stable pulmonary nodules. 3.  Liver cyst, stable. -Virtual Colonoscopy 10/2005 by [facility].  Pt on chronic coumadin therapy for Aortic valve replacement therefore at high risk for colonoscopy.  Virtual colonsocpy was negative, but cannot see polyps < 5mm.   -Echo 05/2009: EF 55-60%, atrium mildly dilated -Hypertension -Hyperlipidemia -Atrial Fibrillation/atrial flutter: cardiologist, Dr Jens Som -Osteoarthritis

## 2010-04-10 NOTE — Procedures (Signed)
Summary: summary report  summary report   Imported By: Mirna Mires 05/24/2009 15:59:42  _____________________________________________________________________  External Attachment:    Type:   Image     Comment:   External Document

## 2010-04-15 ENCOUNTER — Encounter: Payer: Self-pay | Admitting: Family Medicine

## 2010-04-15 ENCOUNTER — Other Ambulatory Visit: Payer: Self-pay | Admitting: Family Medicine

## 2010-04-15 ENCOUNTER — Ambulatory Visit
Admission: RE | Admit: 2010-04-15 | Discharge: 2010-04-15 | Disposition: A | Discharge status: Home / Self Care | Payer: Medicare HMO | Source: Ambulatory Visit | Attending: *Deleted | Admitting: *Deleted

## 2010-04-15 ENCOUNTER — Other Ambulatory Visit: Payer: Self-pay | Admitting: *Deleted

## 2010-04-15 ENCOUNTER — Ambulatory Visit
Admission: RE | Admit: 2010-04-15 | Discharge: 2010-04-15 | Disposition: A | Discharge status: Home / Self Care | Payer: Medicare PPO | Source: Ambulatory Visit | Attending: *Deleted | Admitting: *Deleted

## 2010-04-15 ENCOUNTER — Other Ambulatory Visit: Payer: Self-pay

## 2010-04-15 ENCOUNTER — Ambulatory Visit (INDEPENDENT_AMBULATORY_CARE_PROVIDER_SITE_OTHER): Payer: Medicare PPO | Admitting: Family Medicine

## 2010-04-15 ENCOUNTER — Other Ambulatory Visit: Payer: Self-pay | Admitting: Sports Medicine

## 2010-04-15 DIAGNOSIS — M25569 Pain in unspecified knee: Secondary | ICD-10-CM | POA: Insufficient documentation

## 2010-04-19 ENCOUNTER — Encounter: Payer: Self-pay | Admitting: Family Medicine

## 2010-04-19 DIAGNOSIS — Z954 Presence of other heart-valve replacement: Secondary | ICD-10-CM

## 2010-04-19 DIAGNOSIS — Z7901 Long term (current) use of anticoagulants: Secondary | ICD-10-CM | POA: Insufficient documentation

## 2010-04-23 ENCOUNTER — Ambulatory Visit (INDEPENDENT_AMBULATORY_CARE_PROVIDER_SITE_OTHER): Payer: Medicare PPO | Admitting: *Deleted

## 2010-04-23 DIAGNOSIS — Z954 Presence of other heart-valve replacement: Secondary | ICD-10-CM

## 2010-04-23 DIAGNOSIS — Z7901 Long term (current) use of anticoagulants: Secondary | ICD-10-CM
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
## 2010-05-05 ENCOUNTER — Ambulatory Visit (INDEPENDENT_AMBULATORY_CARE_PROVIDER_SITE_OTHER): Payer: Medicare PPO | Admitting: Sports Medicine

## 2010-05-05 ENCOUNTER — Encounter: Payer: Self-pay | Admitting: Sports Medicine

## 2010-05-05 DIAGNOSIS — M1710 Unilateral primary osteoarthritis, unspecified knee: Secondary | ICD-10-CM

## 2010-05-05 DIAGNOSIS — M25569 Pain in unspecified knee: Secondary | ICD-10-CM
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
## 2010-05-21 ENCOUNTER — Ambulatory Visit (INDEPENDENT_AMBULATORY_CARE_PROVIDER_SITE_OTHER): Payer: Medicare HMO | Admitting: *Deleted

## 2010-05-21 DIAGNOSIS — Z954 Presence of other heart-valve replacement: Secondary | ICD-10-CM

## 2010-05-21 DIAGNOSIS — Z7901 Long term (current) use of anticoagulants: Secondary | ICD-10-CM

## 2010-06-10 ENCOUNTER — Encounter: Payer: Self-pay | Admitting: Home Health Services

## 2010-06-20 ENCOUNTER — Ambulatory Visit (INDEPENDENT_AMBULATORY_CARE_PROVIDER_SITE_OTHER): Payer: Medicare HMO | Admitting: *Deleted

## 2010-06-20 DIAGNOSIS — Z7901 Long term (current) use of anticoagulants: Secondary | ICD-10-CM

## 2010-06-20 DIAGNOSIS — Z954 Presence of other heart-valve replacement: Secondary | ICD-10-CM

## 2010-06-30 ENCOUNTER — Other Ambulatory Visit: Payer: Self-pay | Admitting: Family Medicine

## 2010-07-10 ENCOUNTER — Other Ambulatory Visit: Payer: Self-pay | Admitting: Family Medicine

## 2010-07-10 NOTE — Telephone Encounter (Signed)
Refill request

## 2010-07-18 ENCOUNTER — Ambulatory Visit (INDEPENDENT_AMBULATORY_CARE_PROVIDER_SITE_OTHER): Payer: Medicare HMO | Admitting: *Deleted

## 2010-07-18 DIAGNOSIS — Z954 Presence of other heart-valve replacement: Secondary | ICD-10-CM

## 2010-07-18 DIAGNOSIS — Z7901 Long term (current) use of anticoagulants: Secondary | ICD-10-CM

## 2010-07-22 NOTE — Assessment & Plan Note (Signed)
[facility]                            CARDIOLOGY OFFICE NOTE   Juan Wilkerson, Juan Wilkerson                         MRN:          [id number]  DATE:01/18/2007                            DOB:          1940/08/09    PRIMARY CARE PHYSICIAN:  Dr. Chuck Hint with [facility].   REASON FOR VISIT:  Cardiac followup.   HISTORY OF PRESENT ILLNESS:  Juan Wilkerson comes in for a 1-year visit.  Overall, he reports doing fairly well.  He did have an episode of  dizziness described as a feeling of vertigo several weeks ago, although  has not had this in any progressive fashion and had no frank syncope.  He has also had some intermittent right shoulder discomfort and chest  twinges but nothing progressive or exertional.  I referred him for a  followup echocardiogram last October, which revealed an ejection  fraction of 65-70% without regional wall motion abnormalities.  Mechanical aortic valve prosthesis was noted and appeared to be  functioning well with a mean transvalvular gradient of 7 mmHg.  He did  not have a followup CT scan of his chest and has not had one for some  time (history of thoracic aortic aneurysm repair in 1995).  Today, we  talked about this and otherwise continued observation was planned.  An  electrocardiogram shows sinus bradycardia at 47 beats per minute with a  leftward axis, which is old.  This replaces an ectopic bradycardia noted  on his last tracing.   ALLERGIES:  No known drug allergies.   PRESENT MEDICATIONS:  1. Coumadin as directed by the [facility].  2. Amlodipine 5 mg p.o. daily.  3. Simvastatin 40 mg p.o. daily.  4. Multivitamin daily.  5. Glucosamine chondroitin.  6. Acetaminophen.   REVIEW OF SYSTEMS:  As described in the history of present illness.  Otherwise, negative.   EXAMINATION:  Blood pressure is 129/76, heart rate is 47, weight 207  pounds, which is stable.  The patient is comfortable and in  no acute distress.  HEENT:  Conjunctivae and lids normal.  Oropharynx clear.  NECK:  Supple.  No elevated jugular venous pressure.  No loud bruits.  No thyromegaly is noted.  LUNGS:  Clear without labored breathing.  CARDIAC:  Reveals a crisp prosthetic aortic valve sound in S2 with 2/6  systolic murmur heard at the base radiating towards the carotids on the  left.  No diastolic murmur.  No S3 gallop or pericardial rub.  ABDOMEN:  Soft and nontender.  No bruit is evident.  EXTREMITIES:  No pitting edema.  SKIN:  Warm and dry.  MUSCULOSKELETAL:  No kyphosis is noted.  NEURO/PSYCH:  The patient is alert and oriented x3.   IMPRESSION/RECOMMENDATIONS:  1. Bradycardia, chronic and not clearly symptomatic.  The patient did      have an episode of transient dizziness several weeks ago, which may      have been more vertigo/inner ear related based on his symptom      description.  In any event, he has had no  progressive symptoms and      has had no frank syncope.  Observation is recommended and avoiding      AV nodal blocking drugs.  2. History of St. Jude valve replacement with thoracic aortic aneurysm      repair in 1995.  I have recommended a followup contrast CT scan of      the chest.  He has not had one for several years.  He does report a      shrimp allergy and will, therefore, be pretreated for contrast      dye prophylaxis, although I do not see that this was ever an issue      documented in his chart at this time.  A BMET will be arranged as      well to follow up on renal function prior to contrast.  If this is      stable, I would anticipate a 1-year followup for symptom review.      His valve function was stable by echocardiography last year.     Jonelle Sidle, MD  Electronically Signed    SGM/MedQ  DD: 01/18/2007  DT: 01/19/2007  Job #: [id number]   cc:   Dr. Chuck Hint
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
## 2010-08-13 ENCOUNTER — Ambulatory Visit (INDEPENDENT_AMBULATORY_CARE_PROVIDER_SITE_OTHER): Payer: Medicare HMO | Admitting: *Deleted

## 2010-08-13 ENCOUNTER — Encounter: Payer: Self-pay | Admitting: Family Medicine

## 2010-08-13 ENCOUNTER — Ambulatory Visit (INDEPENDENT_AMBULATORY_CARE_PROVIDER_SITE_OTHER): Payer: Medicare HMO | Admitting: Family Medicine

## 2010-08-13 VITALS — BP 128/81 | HR 65 | Temp 97.8°F | Ht 76.0 in | Wt 209.0 lb

## 2010-08-13 DIAGNOSIS — E7800 Pure hypercholesterolemia, unspecified: Secondary | ICD-10-CM

## 2010-08-13 DIAGNOSIS — Z7901 Long term (current) use of anticoagulants: Secondary | ICD-10-CM

## 2010-08-13 DIAGNOSIS — I10 Essential (primary) hypertension: Secondary | ICD-10-CM

## 2010-08-13 DIAGNOSIS — I4891 Unspecified atrial fibrillation: Secondary | ICD-10-CM

## 2010-08-13 DIAGNOSIS — Z23 Encounter for immunization: Secondary | ICD-10-CM

## 2010-08-13 DIAGNOSIS — Z954 Presence of other heart-valve replacement: Secondary | ICD-10-CM

## 2010-08-13 DIAGNOSIS — Z1211 Encounter for screening for malignant neoplasm of colon: Secondary | ICD-10-CM

## 2010-08-13 DIAGNOSIS — R079 Chest pain, unspecified: Secondary | ICD-10-CM

## 2010-08-13 MED ORDER — AMLODIPINE BESYLATE 10 MG PO TABS: 10.0000 mg | ORAL_TABLET | Freq: Every day | ORAL | Status: DC

## 2010-08-13 NOTE — Assessment & Plan Note (Addendum)
Pt takes coumadin for aortic valve replacement and Atrial fibrillation. Given his risk factors it would safer for pt to get virtual colonoscopy with CT scan.  Last CT was done on 11/03/2005 and was negative.  We discussed the limitations of this test vs colonoscopy.  Will likely need PA for this.

## 2010-08-13 NOTE — Patient Instructions (Addendum)
Please call your insurance company to find out how much it would cost for the Zostavax (Shingles vaccine). Please call [facility] office for an appointment.  Please schedule an appointment for lab work.  These are fasting labs, so no food or drink after midnight the night before.  Water and black coffee is ok.  You can also take your medicine in the morning.  Continue all your medications as before. We will call you regarding the Juan Wilkerson Scan for colonoscopy.

## 2010-08-13 NOTE — Progress Notes (Signed)
  Subjective:    Patient ID: Juan Wilkerson, male    DOB: August 30, 1940, 70 y.o.   MRN: [id number]  HPI Pt is here for routine follow up exam   Chest pain yesterday: Left sided, lasted a minute to a minute and a half.  Occurred while he was sitting down.  He cannot describe the pain, but it did not feel like pressure, squeezing, crushing, heavy pain.  He denies nausea, vomiting, diaphoresis, dyspnea during episode.  States that chest pain went away after he took a few deep breaths.  He thinks this has occurred a couple of other times before, all while he was sitting down.  He thinks that the other times were related to heartburn.   HYPERTENSION Disease Monitoring Blood pressure range: 120s-140s/80s-90s Medications: Amlodipine 10mg , Cannot tolerate BB 2/2 bradycardia Compliance: yes  Lightheadedness:no   Edema: no  Chest pain: see above  Dyspnea: no  Palpitations: no Prevention Exercise: yes, walking   Salt restriction: yes  HYPERLIPIDEMIA Med: Simvastatin 40mg  FLP:  04/17/2009 with LDL 90, HDL 44, Trig 214 Prob taking med: no Abd pain: no  Abd LFT: no  Diet: tries to eat healthy diet   Weight change: stable   ATRIAL FIBRILLATION/FLUTTER Last visit with Dr Jens Som was 07/15/2009.  Exam was stable.  Was told to follow up in one year.   No dyspnea, fatigue, palpitations, syncope, chest pain, diaphoresis, weakness, numbness  Preventative: Virtual colonoscopy 11/03/2005.  Need another one 10/2010 Pneumonia vaccine:  Will get today Shingles vaccine: pt to discuss insurance company  Past Medical History: -Aortic aneurysm - 1994 from presumed Marfan`s,  -On chronic coumadin for artificial aortic valve, INR goal 2.5-3.5 -Pulmonary nodules in lung found on Chest CT 6/09.  Will monitor.  CT 02/22/08 showed: 3.5 mm nodule in R middle lobe (stable and not enlarging) and adjacent nodule is obscured but also stable.  CT 05/20/09 1.  Aneurysm involving the proximal descending thoracic aorta is stable in size  from prior exam. 2.  Stable pulmonary nodules. 3.  Liver cyst, stable. -Virtual Colonoscopy 10/2005 by [facility].  Pt on chronic coumadin therapy for Aortic valve replacement therefore at high risk for colonoscopy.  Virtual colonsocpy was negative, but cannot see polyps < 5mm.   -Echo 05/2009: EF 55-60%, atrium mildly dilated -Hypertension -Hyperlipidemia -Atrial Fibrillation/atrial flutter: cardiologist, Dr Jens Som -Osteoarthritis  Past Surgical History: Aortic Valve replacement - 03/09/1993, St Jude Valve Hip surgery for R hip fracture - 03/09/1992  Family History: HTN -siblings Prostate CA 1st degree (brother, had surgery and is ok now) Aunt had aortic aneurysm  Social History: Divorced, lives alone. Has a daughter (pt does not have contact with her, has not seen her since she was 30) .  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  No smoking. No alcohol use. No recreational drugs.   Review of Systems Per hpi     Objective:   Physical Exam GEN: vitals reviewed. NAD, AOX3, appropriate HEENT: AT/NT, MMM, Neck supple and full ROM RESP: CTA b/l, No w/r/r CTA: RRR, no murmurs ABD:no abd bruit, +BS, NT/NT, no masses EXT: no edema, no cyanosis, pulses +2, gait stable         Assessment & Plan:

## 2010-08-13 NOTE — Assessment & Plan Note (Addendum)
Pt mentioned that yesterday and in the past he has had incidents of left sided chest pain that lasted about 1 1/2 minutes.  He cannot describe the pain, but states that it did not feel like a heaviness, pressure, or squeezing pain.  He was sitting at his desk when this occurred.  He also noticed numbing sensation in left arm. He denies dyspnea, diaphoretic, nausea, vomiting, syncope.  The pain resolved on its own after he took a few deep breaths.  This is likely not ischemic in nature.  Exam today was wnl.  Pt needs to follow up with Dr Jens Som for yearly exam and I will defer to Dr Jens Som for further work up.

## 2010-08-13 NOTE — Assessment & Plan Note (Signed)
Last FLP 04/2009 with LDL 90, HDL 44, Trig 214.  He is at goal with Simvastatin 40mg  and will continue this med.  Will check FLP and Liver function.

## 2010-08-13 NOTE — Assessment & Plan Note (Signed)
BP at goal with Norvasc 10mg . Will continue this dose.  Will check renal function and electrolytes.

## 2010-08-14 ENCOUNTER — Encounter: Payer: Self-pay | Admitting: Family Medicine

## 2010-08-14 NOTE — Assessment & Plan Note (Signed)
Replaced 1995 with St Jude valve.  On chronic coumadin with INR goal 2.5 -3.5.  His INR has been stable for years.

## 2010-08-14 NOTE — Assessment & Plan Note (Signed)
Pt on coumadin with stable INR.  He is rate controlled and in sinus rhythm.  He did not tolerate BB due to bradycardia.  Advised pt to make appt with Dr Jens Som.

## 2010-08-15 ENCOUNTER — Other Ambulatory Visit: Payer: Medicare HMO

## 2010-08-15 DIAGNOSIS — E7800 Pure hypercholesterolemia, unspecified: Secondary | ICD-10-CM

## 2010-08-15 DIAGNOSIS — I10 Essential (primary) hypertension: Secondary | ICD-10-CM

## 2010-08-15 LAB — CBC
Hemoglobin: 15.4 g/dL (ref 13.0–17.0)
MCH: 30.5 pg (ref 26.0–34.0)
MCV: 91.3 fL (ref 78.0–100.0)
RBC: 5.05 MIL/uL (ref 4.22–5.81)
WBC: 6.1 10*3/uL (ref 4.0–10.5)

## 2010-08-15 LAB — COMPREHENSIVE METABOLIC PANEL
CO2: 29 mEq/L (ref 19–32)
Calcium: 8.8 mg/dL (ref 8.4–10.5)
Chloride: 105 mEq/L (ref 96–112)
Creat: 0.79 mg/dL (ref 0.50–1.35)
Glucose, Bld: 91 mg/dL (ref 70–99)
Sodium: 141 mEq/L (ref 135–145)
Total Bilirubin: 0.6 mg/dL (ref 0.3–1.2)
Total Protein: 6.6 g/dL (ref 6.0–8.3)

## 2010-08-15 LAB — LIPID PANEL
Cholesterol: 161 mg/dL (ref 0–200)
Triglycerides: 111 mg/dL (ref <150)
VLDL: 22 mg/dL (ref 0–40)

## 2010-08-15 NOTE — Progress Notes (Signed)
Flp,cmp and cbc done today [facility] Loralie Malta

## 2010-08-18 ENCOUNTER — Encounter: Payer: Self-pay | Admitting: Family Medicine

## 2010-08-20 ENCOUNTER — Other Ambulatory Visit: Payer: Self-pay | Admitting: Family Medicine

## 2010-08-21 ENCOUNTER — Telehealth: Payer: Self-pay | Admitting: *Deleted

## 2010-08-21 NOTE — Telephone Encounter (Signed)
Left message for patient to return call. Need to know if he has been seen by GI before to schedule virtual colonoscopy.Shawnae Leiva, Rodena Medin

## 2010-08-27 ENCOUNTER — Telehealth: Payer: Self-pay | Admitting: *Deleted

## 2010-08-27 NOTE — Telephone Encounter (Signed)
Left message for pt to call to schedule CTA of the chest for f/u of thoracic aneurysm Juan Wilkerson

## 2010-08-28 ENCOUNTER — Telehealth: Payer: Self-pay | Admitting: *Deleted

## 2010-08-28 NOTE — Telephone Encounter (Signed)
Spoke with pt, he would like to wait until after the first of July to have the ct done. Will call pt back next week to schedule. He gets home from work after [employer]

## 2010-08-28 NOTE — Telephone Encounter (Signed)
Error wrong pt Deliah Goody

## 2010-09-12 ENCOUNTER — Other Ambulatory Visit: Payer: Self-pay | Admitting: Family Medicine

## 2010-09-12 DIAGNOSIS — Z1211 Encounter for screening for malignant neoplasm of colon: Secondary | ICD-10-CM

## 2010-09-16 ENCOUNTER — Telehealth: Payer: Self-pay | Admitting: Cardiology

## 2010-09-16 DIAGNOSIS — IMO0001 Reserved for inherently not codable concepts without codable children: Secondary | ICD-10-CM

## 2010-09-16 NOTE — Telephone Encounter (Signed)
rtn call from debra re scheduling a ct scan, pt will wait at home until he receives call, wants to schedule asap

## 2010-09-16 NOTE — Telephone Encounter (Signed)
Pt. Would like to have a CTA some time this week . Test order is in [facility] at this time. Patient aware that Scheduler will call him back for schedule date and time. Pt would like for  CTA to be done this week. He is due to go back to work July 16 th 2012. Pt's BUN and Creatinine was done on  08/15/10.

## 2010-09-17 ENCOUNTER — Ambulatory Visit: Payer: Medicare HMO

## 2010-09-17 NOTE — Telephone Encounter (Signed)
Spoke with pt, cta scheduled Juan Wilkerson

## 2010-09-17 NOTE — Telephone Encounter (Signed)
Spoke with pt, he will have CTA of the chest here tomorrow @ 11am. Pt aware to be NPO 2 hours prior. Pre-cert notified Deliah Goody

## 2010-09-18 ENCOUNTER — Ambulatory Visit (INDEPENDENT_AMBULATORY_CARE_PROVIDER_SITE_OTHER): Payer: Medicare HMO | Admitting: *Deleted

## 2010-09-18 ENCOUNTER — Ambulatory Visit (INDEPENDENT_AMBULATORY_CARE_PROVIDER_SITE_OTHER)
Admission: RE | Admit: 2010-09-18 | Discharge: 2010-09-18 | Disposition: A | Discharge status: Home / Self Care | Payer: Medicare HMO | Source: Ambulatory Visit | Attending: Cardiology | Admitting: Cardiology

## 2010-09-18 ENCOUNTER — Encounter: Payer: Self-pay | Admitting: *Deleted

## 2010-09-18 DIAGNOSIS — Z954 Presence of other heart-valve replacement: Secondary | ICD-10-CM

## 2010-09-18 DIAGNOSIS — IMO0001 Reserved for inherently not codable concepts without codable children: Secondary | ICD-10-CM

## 2010-09-18 DIAGNOSIS — Z7901 Long term (current) use of anticoagulants: Secondary | ICD-10-CM

## 2010-09-18 DIAGNOSIS — I712 Thoracic aortic aneurysm, without rupture: Secondary | ICD-10-CM

## 2010-09-18 MED ORDER — IOHEXOL 300 MG/ML  SOLN
100.0000 mL | Freq: Once | INTRAMUSCULAR | Status: AC | PRN
  Administered 2010-09-18: 100 mL via INTRAVENOUS

## 2010-10-01 ENCOUNTER — Ambulatory Visit (INDEPENDENT_AMBULATORY_CARE_PROVIDER_SITE_OTHER): Payer: Medicare HMO | Admitting: *Deleted

## 2010-10-01 ENCOUNTER — Encounter: Payer: Self-pay | Admitting: *Deleted

## 2010-10-01 DIAGNOSIS — Z954 Presence of other heart-valve replacement: Secondary | ICD-10-CM

## 2010-10-01 DIAGNOSIS — Z7901 Long term (current) use of anticoagulants: Secondary | ICD-10-CM

## 2010-10-01 NOTE — Progress Notes (Signed)
Appointment for virtual colonoscopy at [facility] on 10/16/2010, 8:15 am. Patient needs to pick up prep kit by 10/14/10. He was notified of this in office today.Busick, Rodena Medin

## 2010-10-13 ENCOUNTER — Other Ambulatory Visit: Payer: Self-pay | Admitting: Family Medicine

## 2010-10-13 MED ORDER — WARFARIN SODIUM 3 MG PO TABS: 3.0000 mg | ORAL_TABLET | Freq: Every day | ORAL | Status: DC

## 2010-10-16 ENCOUNTER — Other Ambulatory Visit: Payer: Medicare HMO

## 2010-10-16 ENCOUNTER — Ambulatory Visit
Admission: RE | Admit: 2010-10-16 | Discharge: 2010-10-16 | Disposition: A | Discharge status: Home / Self Care | Payer: Medicare HMO | Source: Ambulatory Visit | Attending: Family Medicine | Admitting: Family Medicine

## 2010-10-16 DIAGNOSIS — Z1211 Encounter for screening for malignant neoplasm of colon: Secondary | ICD-10-CM

## 2010-10-23 ENCOUNTER — Ambulatory Visit (INDEPENDENT_AMBULATORY_CARE_PROVIDER_SITE_OTHER): Payer: Medicare HMO | Admitting: Cardiology

## 2010-10-23 ENCOUNTER — Encounter: Payer: Self-pay | Admitting: Cardiology

## 2010-10-23 DIAGNOSIS — Z954 Presence of other heart-valve replacement: Secondary | ICD-10-CM

## 2010-10-23 DIAGNOSIS — I359 Nonrheumatic aortic valve disorder, unspecified: Secondary | ICD-10-CM

## 2010-10-23 DIAGNOSIS — I4892 Unspecified atrial flutter: Secondary | ICD-10-CM

## 2010-10-23 DIAGNOSIS — I10 Essential (primary) hypertension: Secondary | ICD-10-CM

## 2010-10-23 DIAGNOSIS — E7800 Pure hypercholesterolemia, unspecified: Secondary | ICD-10-CM

## 2010-10-23 MED ORDER — PRAVASTATIN SODIUM 40 MG PO TABS: 40.0000 mg | ORAL_TABLET | Freq: Every evening | ORAL | Status: DC

## 2010-10-23 NOTE — Patient Instructions (Signed)
Your physician wants you to follow-up in: one year  You will receive a reminder letter in the mail two months in advance. If you don't receive a letter, please call our office to schedule the follow-up appointment.   Your physician has requested that you have an echocardiogram. Echocardiography is a painless test that uses sound waves to create images of your heart. It provides your doctor with information about the size and shape of your heart and how well your heart's chambers and valves are working. This procedure takes approximately one hour. There are no restrictions for this procedure.   STOP SIMVASTATIN  START PRAVASTATIN 40MG  ONCE DAILY  Your physician recommends that you return for lab work in: 6 WEEKS

## 2010-10-23 NOTE — Assessment & Plan Note (Signed)
Continue SBE prophylaxis. Repeat echocardiogram. 

## 2010-10-23 NOTE — Assessment & Plan Note (Signed)
Blood pressure controlled - Continue amlodipine 

## 2010-10-23 NOTE — Assessment & Plan Note (Signed)
Recent CT showed no change in size of his dilated aorta.

## 2010-10-23 NOTE — Assessment & Plan Note (Signed)
Discontinue simvastatin given potential interaction with amlodipine. Begin Pravachol 40 mg daily. Check lipids and liver in 6 weeks.

## 2010-10-23 NOTE — Assessment & Plan Note (Signed)
We'll continue with Coumadin. Given need for Coumadin for aortic valve replacement I do not think we need to pursue ablation or cardioversion.

## 2010-10-23 NOTE — Progress Notes (Signed)
HPI: Pleasant male for fu of AVR. In August of 1995 the patient underwent resection of an aortic aneurysm as well as aortic valve replacement with a #27 mm St. Jude valve and aortic root reconstruction. At that time it is noted that he had suspected Marfan's. Echocardiogram in March of 2011 showed normal LV function, biatrial enlargement and a well-functioning prosthetic aortic valve. Last CTA of his aorta was performed in July of 2012.  There was mild aneurysmal dilatation of the proximal aspect of the descending aorta measuring 4cm. Previous lung nodules were unchanged.   Abdominal ultrasound in November of 1996 showed no aneurysm. Also with h/o atrial flutter; treated with rate control given need for coumadin for AVR. Since he was last seen in May of 2011, he occasionally has mild dyspnea with more extreme activities but not with routine activities. It is relieved with rest. It is not associated with chest pain. There is no orthopnea, PND, palpitations, syncope or bleeding. He occasionally has minimal pedal edema. No exertional chest pain.  Current Outpatient Prescriptions  Medication Sig Dispense Refill  . acetaminophen (TYLENOL) 500 MG tablet Take 500 mg by mouth. 2 tabs by mouth 3 times a day as needed for pain       . amLODipine (NORVASC) 10 MG tablet Take 1 tablet (10 mg total) by mouth daily. For blood pressure  30 tablet  11  . camphor-menthol (SARNA) lotion Apply topically as needed.        . Glucosamine-Chondroit-Vit C-Mn (GLUCOSAMINE-CHONDROITIN) TABS Take by mouth daily.        . hydrocortisone 1 % cream Apply topically. Apply as needed for itching.       . loratadine (CLARITIN) 10 MG tablet Take 10 mg by mouth daily.        . Multiple Vitamin (MULTIVITAMIN) capsule Take 1 capsule by mouth daily.        . simvastatin (ZOCOR) 40 MG tablet TAKE 1 TABLET BY MOUTH AT BEDTIME  34 tablet  11  . warfarin (COUMADIN) 3 MG tablet Take 1 tablet (3 mg total) by mouth daily. Take as directed.  31 tablet   3     Past Medical History  Diagnosis Date  . Marfan's syndrome with aortic dilation   . Hypertension   . Aortic aneurysm 1994    From presumed Marfan's Syndrome.  Followed by Dr Jens Som.  Aneurysm involving the proximal descending thoracic aorta .    Marland Kitchen Pulmonary nodules     Found on chest CT 08/2007.  Monitoring with yearly CT.  3.57mm nodule in R iddle lobe.  Marland Kitchen Hyperlipidemia   . Atrial fibrillation     Followed by Dr Jens Som.  On coumadin.  . Atrial flutter     Followed by Dr Jens Som  . Osteoarthritis     Past Surgical History  Procedure Date  . Virtual colonoscopy 11/01/2005    Pt on chronic coumadin for Aortic valve replacement and Atrial fibrilliation therefore at high risk if stopped.  Need to repeat every 5 yrs.   . Transthoracic echocardiogram 05/2009    EF 55-60%, atrium mildly dilated  . Aortic valve replacement 03/1993    St Jude Valve  . Hip fracture surgery 03/1992    s/p R hip Fx    History   Social History  . Marital Status: Divorced    Spouse Name: N/A    Number of Children: N/A  . Years of Education: College   Occupational History  .  Social History Main Topics  . Smoking status: Never Smoker   . Smokeless tobacco: Never Used  . Alcohol Use: No  . Drug Use: No  . Sexually Active: Not on file   Other Topics Concern  . Not on file   Social History Narrative   Divorced, lives alone. Has a daughter (pt does not have contact with her, has not seen her since she was 91) . 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 No alcohol use. No recreational drugs.    ROS: no fevers or chills, productive cough, hemoptysis, dysphasia, odynophagia, melena, hematochezia, dysuria, hematuria, rash, seizure activity, orthopnea, PND, pedal edema, claudication. Remaining systems are negative.  Physical Exam: Well-developed well-nourished in no acute distress.  Skin  is warm and dry.  HEENT is normal.  Neck is supple. No thyromegaly.  Chest is clear to auscultation with normal expansion.  Cardiovascular exam is irregular; crisp mechanical valve Abdominal exam nontender or distended. No masses palpated. Extremities show no edema. neuro grossly intact  ECG atrial flutter, Left anterior fasicular block

## 2010-10-29 ENCOUNTER — Ambulatory Visit (HOSPITAL_COMMUNITY): Payer: Medicare HMO | Attending: Family Medicine | Admitting: Radiology

## 2010-10-29 ENCOUNTER — Ambulatory Visit (INDEPENDENT_AMBULATORY_CARE_PROVIDER_SITE_OTHER): Payer: Medicare HMO | Admitting: *Deleted

## 2010-10-29 ENCOUNTER — Ambulatory Visit: Payer: Medicare HMO | Admitting: Family Medicine

## 2010-10-29 DIAGNOSIS — I059 Rheumatic mitral valve disease, unspecified: Secondary | ICD-10-CM | POA: Insufficient documentation

## 2010-10-29 DIAGNOSIS — I4891 Unspecified atrial fibrillation: Secondary | ICD-10-CM | POA: Insufficient documentation

## 2010-10-29 DIAGNOSIS — Z7901 Long term (current) use of anticoagulants: Secondary | ICD-10-CM

## 2010-10-29 DIAGNOSIS — I10 Essential (primary) hypertension: Secondary | ICD-10-CM | POA: Insufficient documentation

## 2010-10-29 DIAGNOSIS — R079 Chest pain, unspecified: Secondary | ICD-10-CM | POA: Insufficient documentation

## 2010-10-29 DIAGNOSIS — E7800 Pure hypercholesterolemia, unspecified: Secondary | ICD-10-CM | POA: Insufficient documentation

## 2010-10-29 DIAGNOSIS — I079 Rheumatic tricuspid valve disease, unspecified: Secondary | ICD-10-CM | POA: Insufficient documentation

## 2010-10-29 DIAGNOSIS — Z954 Presence of other heart-valve replacement: Secondary | ICD-10-CM

## 2010-10-29 DIAGNOSIS — I359 Nonrheumatic aortic valve disorder, unspecified: Secondary | ICD-10-CM

## 2010-10-29 LAB — POCT INR: INR: 3.4

## 2010-10-30 ENCOUNTER — Encounter: Payer: Self-pay | Admitting: Family Medicine

## 2010-11-12 ENCOUNTER — Ambulatory Visit (INDEPENDENT_AMBULATORY_CARE_PROVIDER_SITE_OTHER): Payer: Medicare HMO | Admitting: *Deleted

## 2010-11-12 DIAGNOSIS — Z954 Presence of other heart-valve replacement: Secondary | ICD-10-CM

## 2010-11-12 DIAGNOSIS — Z7901 Long term (current) use of anticoagulants: Secondary | ICD-10-CM

## 2010-11-26 ENCOUNTER — Ambulatory Visit (INDEPENDENT_AMBULATORY_CARE_PROVIDER_SITE_OTHER): Payer: Medicare HMO | Admitting: *Deleted

## 2010-11-26 ENCOUNTER — Ambulatory Visit (INDEPENDENT_AMBULATORY_CARE_PROVIDER_SITE_OTHER): Payer: Medicare HMO

## 2010-11-26 DIAGNOSIS — Z23 Encounter for immunization: Secondary | ICD-10-CM

## 2010-11-26 DIAGNOSIS — Z7901 Long term (current) use of anticoagulants: Secondary | ICD-10-CM

## 2010-11-26 DIAGNOSIS — Z954 Presence of other heart-valve replacement: Secondary | ICD-10-CM

## 2010-12-01 ENCOUNTER — Other Ambulatory Visit: Payer: Medicare HMO | Admitting: *Deleted

## 2010-12-03 ENCOUNTER — Other Ambulatory Visit (INDEPENDENT_AMBULATORY_CARE_PROVIDER_SITE_OTHER): Payer: Medicare HMO | Admitting: *Deleted

## 2010-12-03 DIAGNOSIS — Z954 Presence of other heart-valve replacement: Secondary | ICD-10-CM

## 2010-12-03 DIAGNOSIS — E7800 Pure hypercholesterolemia, unspecified: Secondary | ICD-10-CM

## 2010-12-03 DIAGNOSIS — Z7901 Long term (current) use of anticoagulants: Secondary | ICD-10-CM

## 2010-12-03 LAB — HEPATIC FUNCTION PANEL
Albumin: 3.9 g/dL (ref 3.5–5.2)
Alkaline Phosphatase: 87 U/L (ref 39–117)
Total Protein: 6.7 g/dL (ref 6.0–8.3)

## 2010-12-03 LAB — LIPID PANEL
Cholesterol: 168 mg/dL (ref 0–200)
HDL: 44 mg/dL (ref >39.00)
LDL Cholesterol: 101 mg/dL — ABNORMAL HIGH (ref 0–99)
Triglycerides: 117 mg/dL (ref 0.0–149.0)

## 2010-12-04 ENCOUNTER — Encounter: Payer: Self-pay | Admitting: *Deleted

## 2010-12-12 LAB — BUN: BUN: 17 mg/dL (ref 6–23)

## 2010-12-12 LAB — CREATININE, SERUM: Creatinine, Ser: 0.76 mg/dL (ref 0.4–1.5)

## 2010-12-24 ENCOUNTER — Other Ambulatory Visit: Payer: Self-pay | Admitting: Family Medicine

## 2010-12-24 ENCOUNTER — Ambulatory Visit (INDEPENDENT_AMBULATORY_CARE_PROVIDER_SITE_OTHER): Payer: Medicare HMO | Admitting: *Deleted

## 2010-12-24 DIAGNOSIS — Z7901 Long term (current) use of anticoagulants: Secondary | ICD-10-CM

## 2010-12-24 DIAGNOSIS — Z954 Presence of other heart-valve replacement: Secondary | ICD-10-CM

## 2010-12-24 MED ORDER — VARICELLA VIRUS VACCINE LIVE 1350 PFU/0.5ML IJ SUSR: 0.5000 mL | Freq: Once | INTRAMUSCULAR | Status: DC

## 2010-12-24 MED ORDER — ZOSTER VACCINE LIVE 19400 UNT/0.65ML ~~LOC~~ SOLR: 0.6500 mL | Freq: Once | SUBCUTANEOUS | Status: DC

## 2011-01-21 ENCOUNTER — Ambulatory Visit (INDEPENDENT_AMBULATORY_CARE_PROVIDER_SITE_OTHER): Payer: Medicare HMO | Admitting: *Deleted

## 2011-01-21 DIAGNOSIS — Z954 Presence of other heart-valve replacement: Secondary | ICD-10-CM

## 2011-01-21 DIAGNOSIS — Z7901 Long term (current) use of anticoagulants: Secondary | ICD-10-CM

## 2011-02-18 ENCOUNTER — Ambulatory Visit (INDEPENDENT_AMBULATORY_CARE_PROVIDER_SITE_OTHER): Payer: Medicare HMO | Admitting: *Deleted

## 2011-02-18 DIAGNOSIS — Z954 Presence of other heart-valve replacement: Secondary | ICD-10-CM

## 2011-02-18 DIAGNOSIS — Z7901 Long term (current) use of anticoagulants: Secondary | ICD-10-CM

## 2011-02-18 LAB — POCT INR: INR: 2.9

## 2011-02-25 ENCOUNTER — Other Ambulatory Visit: Payer: Self-pay | Admitting: Family Medicine

## 2011-02-25 NOTE — Telephone Encounter (Signed)
Refill request

## 2011-03-18 ENCOUNTER — Ambulatory Visit (INDEPENDENT_AMBULATORY_CARE_PROVIDER_SITE_OTHER): Payer: Medicare Other | Admitting: *Deleted

## 2011-03-18 DIAGNOSIS — Z954 Presence of other heart-valve replacement: Secondary | ICD-10-CM

## 2011-03-18 DIAGNOSIS — Z7901 Long term (current) use of anticoagulants: Secondary | ICD-10-CM

## 2011-03-18 LAB — POCT INR: INR: 2.8

## 2011-04-15 ENCOUNTER — Ambulatory Visit (INDEPENDENT_AMBULATORY_CARE_PROVIDER_SITE_OTHER): Payer: Medicare Other | Admitting: *Deleted

## 2011-04-15 DIAGNOSIS — Z954 Presence of other heart-valve replacement: Secondary | ICD-10-CM

## 2011-04-15 DIAGNOSIS — Z7901 Long term (current) use of anticoagulants: Secondary | ICD-10-CM

## 2011-04-20 ENCOUNTER — Ambulatory Visit (INDEPENDENT_AMBULATORY_CARE_PROVIDER_SITE_OTHER): Payer: Medicare Other | Admitting: Family Medicine

## 2011-04-20 ENCOUNTER — Encounter: Payer: Self-pay | Admitting: Family Medicine

## 2011-04-20 VITALS — BP 138/80 | HR 80 | Ht 76.0 in | Wt 208.0 lb

## 2011-04-20 DIAGNOSIS — J069 Acute upper respiratory infection, unspecified: Secondary | ICD-10-CM | POA: Insufficient documentation

## 2011-04-20 MED ORDER — BENZONATATE 100 MG PO CAPS: 100.0000 mg | ORAL_CAPSULE | Freq: Four times a day (QID) | ORAL | Status: DC | PRN

## 2011-04-20 NOTE — Progress Notes (Signed)
  Subjective:    Patient ID: Juan Wilkerson, male    DOB: [birth year], 71 y.o.   MRN: [id number]  HPI Patient presents today with complaint of cough. He has been coughing since Friday. On Saturday he felt like he had no energy. He's been taking cough drops which did not help. Robitussin-DM seems to help and he is sleeping better. He is feeling better today than he felt this weekend. He is concerned that this may be the start of the flu virus. He is not sure what to do to take care of himself.   Review of Systems No nausea, vomiting, diarrhea, chest pain, shortness of breath, fever    Objective:   Physical Exam  Vital signs reviewed General appearance - alert, well appearing, and in no distress and oriented to person, place, and time Chest - clear to auscultation, no wheezes, rales or rhonchi, symmetric air entry, no tachypnea, retractions or cyanosis Heart-mechanical valve, in rate-controlled atrial fibrillationEyes - pupils equal and reactive, extraocular eye movements intact, sclera anicteric Ears - bilateral TM's and external ear canals normal, right ear normal, left ear normal Nose - normal and patent, no erythema, discharge or polyps Throat-mild erythema posterior pharynx, normal tonsils       Assessment & Plan:

## 2011-04-20 NOTE — Patient Instructions (Signed)

## 2011-04-20 NOTE — Assessment & Plan Note (Signed)
Patient with viral cough. Advised symptomatic treatment. Rx for [employer]. Return with fevers, worsening cough, concerns. Advised to return if pulse elevate and any chest pain.

## 2011-05-13 ENCOUNTER — Ambulatory Visit (INDEPENDENT_AMBULATORY_CARE_PROVIDER_SITE_OTHER): Payer: Medicare Other | Admitting: *Deleted

## 2011-05-13 DIAGNOSIS — Z954 Presence of other heart-valve replacement: Secondary | ICD-10-CM

## 2011-05-13 DIAGNOSIS — Z7901 Long term (current) use of anticoagulants: Secondary | ICD-10-CM

## 2011-05-13 LAB — POCT INR: INR: 2.8

## 2011-06-10 ENCOUNTER — Ambulatory Visit (INDEPENDENT_AMBULATORY_CARE_PROVIDER_SITE_OTHER): Payer: Medicare Other | Admitting: *Deleted

## 2011-06-10 DIAGNOSIS — Z7901 Long term (current) use of anticoagulants: Secondary | ICD-10-CM

## 2011-06-10 DIAGNOSIS — Z954 Presence of other heart-valve replacement: Secondary | ICD-10-CM

## 2011-06-10 LAB — POCT INR: INR: 2.9

## 2011-06-23 ENCOUNTER — Other Ambulatory Visit: Payer: Self-pay | Admitting: Family Medicine

## 2011-07-07 ENCOUNTER — Ambulatory Visit (INDEPENDENT_AMBULATORY_CARE_PROVIDER_SITE_OTHER): Payer: Medicare Other | Admitting: *Deleted

## 2011-07-07 DIAGNOSIS — Z7901 Long term (current) use of anticoagulants: Secondary | ICD-10-CM

## 2011-07-07 DIAGNOSIS — Z954 Presence of other heart-valve replacement: Secondary | ICD-10-CM

## 2011-07-21 ENCOUNTER — Ambulatory Visit (INDEPENDENT_AMBULATORY_CARE_PROVIDER_SITE_OTHER): Payer: Medicare Other | Admitting: *Deleted

## 2011-07-21 DIAGNOSIS — Z7901 Long term (current) use of anticoagulants: Secondary | ICD-10-CM

## 2011-07-21 DIAGNOSIS — Z954 Presence of other heart-valve replacement: Secondary | ICD-10-CM

## 2011-08-17 ENCOUNTER — Other Ambulatory Visit: Payer: Self-pay | Admitting: Family Medicine

## 2011-08-17 NOTE — Telephone Encounter (Signed)
Will refill x2 months, pt needs appt w/in that time for further refills.

## 2011-08-18 ENCOUNTER — Ambulatory Visit (INDEPENDENT_AMBULATORY_CARE_PROVIDER_SITE_OTHER): Payer: Medicare Other | Admitting: *Deleted

## 2011-08-18 DIAGNOSIS — Z954 Presence of other heart-valve replacement: Secondary | ICD-10-CM

## 2011-08-18 DIAGNOSIS — Z7901 Long term (current) use of anticoagulants: Secondary | ICD-10-CM

## 2011-09-15 ENCOUNTER — Ambulatory Visit (INDEPENDENT_AMBULATORY_CARE_PROVIDER_SITE_OTHER): Payer: Medicare Other | Admitting: *Deleted

## 2011-09-15 ENCOUNTER — Ambulatory Visit (INDEPENDENT_AMBULATORY_CARE_PROVIDER_SITE_OTHER): Payer: Medicare Other | Admitting: Family Medicine

## 2011-09-15 ENCOUNTER — Encounter: Payer: Self-pay | Admitting: Family Medicine

## 2011-09-15 VITALS — BP 123/77 | HR 64 | Ht 76.0 in | Wt 199.7 lb

## 2011-09-15 DIAGNOSIS — Z7901 Long term (current) use of anticoagulants: Secondary | ICD-10-CM

## 2011-09-15 DIAGNOSIS — M159 Polyosteoarthritis, unspecified: Secondary | ICD-10-CM

## 2011-09-15 DIAGNOSIS — I10 Essential (primary) hypertension: Secondary | ICD-10-CM

## 2011-09-15 DIAGNOSIS — I4891 Unspecified atrial fibrillation: Secondary | ICD-10-CM

## 2011-09-15 DIAGNOSIS — Z954 Presence of other heart-valve replacement: Secondary | ICD-10-CM

## 2011-09-15 LAB — POCT INR: INR: 2.9

## 2011-09-15 NOTE — Assessment & Plan Note (Signed)
Remains in a fib, but rate controlled.  Doing well on Coumadin, INR 2.9 today and within goal range. Will f/u with Cards next month (needs to call and make appt)

## 2011-09-15 NOTE — Patient Instructions (Signed)
It was nice to meet you today!  Your INR was good-- keep taking the same dose and come back in 1 month for your next INR check.  Your blood pressure is great, no need to change anything!  I think your knee pain is related to arthritis.  You can try to take the SLOW RELEASE Tylenol (since you can't take antiinflammatories like motrin)- you could take 650mg  every 6-8 hours.  In order to not throw off your INR, pick a dose and we will stick with that.  But, taking the slow release more frequently may give you better knee pain relief.  We can also consider physical therapy, an injection, or a sports medicine referral.  Come back in 1 month for your INR.  We can also discuss your knees at that time if you would like.

## 2011-09-15 NOTE — Assessment & Plan Note (Signed)
Currently having problems with bilateral knees.  Will increase frequency of tylenol (unable to take antiinflammatories because of coumadin and bleeding risk).  Consider injection vs PT vs SM referral-- pt will consider options and we will discuss at next visit.

## 2011-09-15 NOTE — Progress Notes (Signed)
S: Pt comes in today for follow up.  HYPERTENSION BP: 123/77 Meds: norvasc 10 Taking meds: Yes     # of doses missed/week: 0 Symptoms: Headache: No Dizziness: Yes : more vertigo, room spinning w/ sitting occasionally, goes away with deep breaths and sitting still Vision changes: No SOB:  No Chest pain: No LE swelling: No Tobacco use: No   ANTICOAGULATION For a fib/a flutter.  Needs INR today --> 2.9 (goal 2.5-3.5).   BILATERAL KNEE PAIN Pain is 3-7/10.  Mostly with standing (when first stands from sitting, esp with prolonged sitting).  Right knee is worse than left.  Left has just started over the past few months, right knee has been bothering him for "a long time"- multiple years.  No new injuries. No falls. No redness or tenderness. No warmth. Small amount of swelling in right knee.  Occasional sharp pain with going up and down stairs, esp with turning with weight on his leg.    ROS: Per HPI  History  Smoking status  . Never Smoker   Smokeless tobacco  . Never Used    O:  Filed Vitals:   09/15/11 0915  BP: 123/77  Pulse: 64    Gen: NAD CV: irregularly, irregular; no murmur Pulm: CTA bilat, no wheezes or crackles Ext: Warm, no chronic skin changes, no edema; bilateral knees w/o tenderness to palpation, full ROM bilaterally, no edema/erythema/warmth; + varicose veins over bilateral knees otherwise normal inspection; no pain with passive or active movement of knees on exam   A/P: 71 y.o. male p/w a fib, HTN, OA of bilateral knees -See problem list -f/u in 1 month for INR

## 2011-09-15 NOTE — Assessment & Plan Note (Signed)
INR at goal, 2.9. Continue current coumadin dosing of 3mg  daily

## 2011-09-15 NOTE — Assessment & Plan Note (Signed)
Well controlled. Cont norvasc.

## 2011-10-13 ENCOUNTER — Other Ambulatory Visit: Payer: Self-pay | Admitting: Family Medicine

## 2011-10-13 ENCOUNTER — Ambulatory Visit (INDEPENDENT_AMBULATORY_CARE_PROVIDER_SITE_OTHER): Payer: Medicare Other | Admitting: *Deleted

## 2011-10-13 DIAGNOSIS — Z954 Presence of other heart-valve replacement: Secondary | ICD-10-CM

## 2011-10-13 DIAGNOSIS — Z7901 Long term (current) use of anticoagulants: Secondary | ICD-10-CM

## 2011-10-16 ENCOUNTER — Other Ambulatory Visit: Payer: Self-pay | Admitting: Cardiology

## 2011-10-16 NOTE — Telephone Encounter (Signed)
Fax Received. Refill Completed. Juan Wilkerson (R.M.A)   

## 2011-10-22 ENCOUNTER — Other Ambulatory Visit: Payer: Self-pay | Admitting: Family Medicine

## 2011-10-28 ENCOUNTER — Ambulatory Visit (INDEPENDENT_AMBULATORY_CARE_PROVIDER_SITE_OTHER): Payer: Medicare Other | Admitting: *Deleted

## 2011-10-28 DIAGNOSIS — Z954 Presence of other heart-valve replacement: Secondary | ICD-10-CM

## 2011-10-28 DIAGNOSIS — Z7901 Long term (current) use of anticoagulants: Secondary | ICD-10-CM

## 2011-10-28 LAB — POCT INR: INR: 3.1

## 2011-11-10 ENCOUNTER — Encounter: Payer: Self-pay | Admitting: Cardiology

## 2011-11-10 ENCOUNTER — Ambulatory Visit (INDEPENDENT_AMBULATORY_CARE_PROVIDER_SITE_OTHER): Payer: Medicare Other | Admitting: Cardiology

## 2011-11-10 ENCOUNTER — Other Ambulatory Visit: Payer: Self-pay | Admitting: *Deleted

## 2011-11-10 VITALS — BP 138/85 | HR 105 | Ht 76.0 in | Wt 200.0 lb

## 2011-11-10 DIAGNOSIS — Z954 Presence of other heart-valve replacement: Secondary | ICD-10-CM

## 2011-11-10 DIAGNOSIS — I4891 Unspecified atrial fibrillation: Secondary | ICD-10-CM

## 2011-11-10 DIAGNOSIS — Z9889 Other specified postprocedural states: Secondary | ICD-10-CM

## 2011-11-10 DIAGNOSIS — Z952 Presence of prosthetic heart valve: Secondary | ICD-10-CM

## 2011-11-10 DIAGNOSIS — Z8679 Personal history of other diseases of the circulatory system: Secondary | ICD-10-CM

## 2011-11-10 LAB — BASIC METABOLIC PANEL
BUN: 15 mg/dL (ref 6–23)
CO2: 28 mEq/L (ref 19–32)
Calcium: 9.4 mg/dL (ref 8.4–10.5)
Creatinine, Ser: 0.9 mg/dL (ref 0.4–1.5)

## 2011-11-10 MED ORDER — AMLODIPINE BESYLATE 5 MG PO TABS: 5.0000 mg | ORAL_TABLET | Freq: Every day | ORAL | Status: DC

## 2011-11-10 MED ORDER — METOPROLOL SUCCINATE ER 25 MG PO TB24: 25.0000 mg | ORAL_TABLET | Freq: Every day | ORAL | Status: DC

## 2011-11-10 NOTE — Assessment & Plan Note (Signed)
Continue statin. 

## 2011-11-10 NOTE — Assessment & Plan Note (Signed)
Rate mildly elevated. Add Toprol 25 mg daily. Continue Coumadin.

## 2011-11-10 NOTE — Assessment & Plan Note (Signed)
Continue SBE prophylaxis. Repeat echocardiogram. Continue Coumadin. Coumadin is monitored at family practice.

## 2011-11-10 NOTE — Progress Notes (Signed)
HPI: Pleasant male for fu of AVR. In August of 1995 the patient underwent resection of an aortic aneurysm as well as aortic valve replacement with a #27 mm St. Jude valve and aortic root reconstruction. At that time it is noted that he had suspected Marfan's. Last CTA of his aorta was performed in July of 2012. There was mild aneurysmal dilatation of the proximal aspect of the descending aorta measuring 4cm. Previous lung nodules were unchanged. Abdominal ultrasound in November of 1996 showed no aneurysm.  Also with h/o atrial flutter/fibrillation; treated with rate control given need for coumadin for AVR. Last echo in August of 2012 showed mild LVE, EF 55-60, prosthetic AVR with mean gradient of 19 mmHg, mild TR. Since he was last seen in August 2012, he occasionally has mild dyspnea with more extreme activities but not with routine activities. It is relieved with rest. It is not associated with chest pain. There is no orthopnea, PND, palpitations, syncope or bleeding. He occasionally has minimal pedal edema. No exertional chest pain.   Current Outpatient Prescriptions  Medication Sig Dispense Refill  . acetaminophen (TYLENOL) 500 MG tablet Take 500 mg by mouth. 2 tabs by mouth 3 times a day as needed for pain       . amLODipine (NORVASC) 10 MG tablet TAKE 1 TABLET BY MOUTH EVERY DAY  30 tablet  1  . camphor-menthol (SARNA) lotion Apply topically as needed.        . Glucosamine-Chondroit-Vit C-Mn (GLUCOSAMINE-CHONDROITIN) TABS Take by mouth daily.        . hydrocortisone 1 % cream Apply topically. Apply as needed for itching.       . loratadine (CLARITIN) 10 MG tablet Take 10 mg by mouth daily.        . Multiple Vitamin (MULTIVITAMIN) capsule Take 1 capsule by mouth daily.        . pravastatin (PRAVACHOL) 40 MG tablet TAKE 1 TABLET BY MOUTH EVERY EVENING.  30 tablet  0  . warfarin (COUMADIN) 3 MG tablet TAKE 1 TABLET BY MOUTH DAILY. TAKE AS DIRECTED.  31 tablet  3     Past Medical History    Diagnosis Date  . Marfan's syndrome with aortic dilation   . Hypertension   . Aortic aneurysm 1994    From presumed Marfan's Syndrome.  Followed by Dr Jens Som.  Aneurysm involving the proximal descending thoracic aorta .    Marland Kitchen Pulmonary nodules     Found on chest CT 08/2007.  Monitoring with yearly CT.  3.66mm nodule in R iddle lobe.  Marland Kitchen Hyperlipidemia   . Atrial fibrillation     Followed by Dr Jens Som.  On coumadin.  . Atrial flutter     Followed by Dr Jens Som  . Osteoarthritis     Past Surgical History  Procedure Date  . Virtual colonoscopy 11/01/2005    Pt on chronic coumadin for Aortic valve replacement and Atrial fibrilliation therefore at high risk if stopped.  Need to repeat every 5 yrs.   . Transthoracic echocardiogram 05/2009    EF 55-60%, atrium mildly dilated  . Aortic valve replacement 03/1993    St Jude Valve  . Hip fracture surgery 03/1992    s/p R hip Fx    History   Social History  . Marital Status: Divorced    Spouse Name: N/A    Number of Children: N/A  . Years of Education: College   Occupational History  .     Social History Main Topics  .  Smoking status: Never Smoker   . Smokeless tobacco: Never Used  . Alcohol Use: No  . Drug Use: No  . Sexually Active: Not on file   Other Topics Concern  . Not on file   Social History Narrative   Divorced, lives alone. Has a daughter (pt does not have contact with her, has not seen her since she was 4) . 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 No alcohol use. No recreational drugs.    ROS: no fevers or chills, productive cough, hemoptysis, dysphasia, odynophagia, melena, hematochezia, dysuria, hematuria, rash, seizure activity, orthopnea, PND, claudication. Remaining systems are negative.  Physical Exam: Well-developed well-nourished in no acute distress.  Skin is warm and dry.  HEENT is normal.  Neck is  supple.  Chest is clear to auscultation with normal expansion. Previous sternotomy. Cardiovascular exam is regular rate and rhythm. Crisp mechanical valve sounds. 1/6 systolic murmur. No diastolic murmur noted. Abdominal exam nontender or distended. No masses palpated. Extremities show no edema. neuro grossly intact  ECG atrial fibrillation at a rate of 105. Left anterior fascicular block. Incomplete right bundle branch block. No ST changes.

## 2011-11-10 NOTE — Assessment & Plan Note (Signed)
Plan repeat CTA of the thoracic aorta. 

## 2011-11-10 NOTE — Assessment & Plan Note (Signed)
The patient has some orthostatic symptoms. I am adding Toprol 25 mg daily for his atrial fibrillation with mildly elevated rate. Therefore decrease Norvasc to 5 mg daily.

## 2011-11-10 NOTE — Patient Instructions (Addendum)
Your physician wants you to follow-up in: ONE YEAR WITH DR Shelda Pal will receive a reminder letter in the mail two months in advance. If you don't receive a letter, please call our office to schedule the follow-up appointment.   Your physician recommends that you HAVE LAB WORK TODAY  Non-Cardiac CT Angiography (CTA), is a special type of CT scan that uses a computer to produce multi-dimensional views of major blood vessels throughout the body. In CT angiography, a contrast material is injected through an IV to help visualize the blood vessels  CTA OF THE CHEST TO FOLLOW UP THORACIC ANEURYSM REPAIR  Your physician has requested that you have an echocardiogram. Echocardiography is a painless test that uses sound waves to create images of your heart. It provides your doctor with information about the size and shape of your heart and how well your heart's chambers and valves are working. This procedure takes approximately one hour. There are no restrictions for this procedure.   START METOPROLOL SUCC 25 MG ONCE DAILY  DECREASE AMLODIPINE TO 5 MG ONCE DAILY

## 2011-11-17 ENCOUNTER — Ambulatory Visit (HOSPITAL_COMMUNITY): Payer: Medicare Other | Attending: Cardiovascular Disease | Admitting: Radiology

## 2011-11-17 ENCOUNTER — Other Ambulatory Visit: Payer: Self-pay | Admitting: Cardiology

## 2011-11-17 ENCOUNTER — Ambulatory Visit (INDEPENDENT_AMBULATORY_CARE_PROVIDER_SITE_OTHER)
Admission: RE | Admit: 2011-11-17 | Discharge: 2011-11-17 | Disposition: A | Discharge status: Home / Self Care | Payer: Medicare Other | Source: Ambulatory Visit | Attending: Cardiology | Admitting: Cardiology

## 2011-11-17 DIAGNOSIS — I4891 Unspecified atrial fibrillation: Secondary | ICD-10-CM | POA: Insufficient documentation

## 2011-11-17 DIAGNOSIS — Z952 Presence of prosthetic heart valve: Secondary | ICD-10-CM

## 2011-11-17 DIAGNOSIS — I369 Nonrheumatic tricuspid valve disorder, unspecified: Secondary | ICD-10-CM | POA: Insufficient documentation

## 2011-11-17 DIAGNOSIS — Z9889 Other specified postprocedural states: Secondary | ICD-10-CM

## 2011-11-17 DIAGNOSIS — I359 Nonrheumatic aortic valve disorder, unspecified: Secondary | ICD-10-CM

## 2011-11-17 DIAGNOSIS — Z8679 Personal history of other diseases of the circulatory system: Secondary | ICD-10-CM

## 2011-11-17 DIAGNOSIS — I379 Nonrheumatic pulmonary valve disorder, unspecified: Secondary | ICD-10-CM | POA: Insufficient documentation

## 2011-11-17 MED ORDER — IOHEXOL 350 MG/ML SOLN
100.0000 mL | Freq: Once | INTRAVENOUS | Status: AC | PRN
  Administered 2011-11-17: 100 mL via INTRAVENOUS

## 2011-11-17 NOTE — Progress Notes (Signed)
Echocardiogram performed.  

## 2011-11-19 ENCOUNTER — Other Ambulatory Visit: Payer: Self-pay | Admitting: *Deleted

## 2011-11-19 DIAGNOSIS — R9389 Abnormal findings on diagnostic imaging of other specified body structures: Secondary | ICD-10-CM

## 2011-11-20 ENCOUNTER — Ambulatory Visit (INDEPENDENT_AMBULATORY_CARE_PROVIDER_SITE_OTHER)
Admission: RE | Admit: 2011-11-20 | Discharge: 2011-11-20 | Disposition: A | Discharge status: Home / Self Care | Payer: Medicare Other | Source: Ambulatory Visit | Attending: Cardiology | Admitting: Cardiology

## 2011-11-20 DIAGNOSIS — R9389 Abnormal findings on diagnostic imaging of other specified body structures: Secondary | ICD-10-CM

## 2011-11-20 MED ORDER — IOHEXOL 300 MG/ML  SOLN
100.0000 mL | Freq: Once | INTRAMUSCULAR | Status: AC | PRN
  Administered 2011-11-20: 100 mL via INTRAVENOUS

## 2011-11-22 ENCOUNTER — Telehealth: Payer: Self-pay | Admitting: Cardiology

## 2011-11-22 NOTE — Telephone Encounter (Signed)
Patient notified of abdominal CT results and need for urology evaluation; will arrange appointment in AM. Juan Wilkerson

## 2011-11-23 ENCOUNTER — Telehealth: Payer: Self-pay | Admitting: *Deleted

## 2011-11-23 DIAGNOSIS — N2889 Other specified disorders of kidney and ureter: Secondary | ICD-10-CM

## 2011-11-23 NOTE — Telephone Encounter (Signed)
Message copied by Deliah Boston on Mon Nov 23, 2011  2:20 PM ------      Message from: Lewayne Bunting      Created: Fri Nov 20, 2011  4:41 PM       Please arrange urology evaluation next week      Olga Millers

## 2011-11-23 NOTE — Telephone Encounter (Signed)
Spoke with patient and have placed order for referral.  Patient aware  Order in

## 2011-11-23 NOTE — Telephone Encounter (Signed)
Message copied by LANIER, KELLY F on Mon Nov 23, 2011  2:20 PM ------      Message from: CRENSHAW, BRIAN S      Created: Fri Nov 20, 2011  4:41 PM       Please arrange urology evaluation next week      Brian Crenshaw       

## 2011-11-23 NOTE — Telephone Encounter (Signed)
Message copied by Deliah Boston on Mon Nov 23, 2011  2:19 PM ------      Message from: Lewayne Bunting      Created: Fri Nov 20, 2011  4:41 PM       Please arrange urology evaluation next week      Olga Millers

## 2011-11-25 ENCOUNTER — Other Ambulatory Visit (HOSPITAL_COMMUNITY): Payer: Self-pay | Admitting: Urology

## 2011-11-25 ENCOUNTER — Ambulatory Visit (INDEPENDENT_AMBULATORY_CARE_PROVIDER_SITE_OTHER): Payer: Medicare Other | Admitting: *Deleted

## 2011-11-25 DIAGNOSIS — Z23 Encounter for immunization: Secondary | ICD-10-CM

## 2011-11-25 DIAGNOSIS — Z7901 Long term (current) use of anticoagulants: Secondary | ICD-10-CM

## 2011-11-25 DIAGNOSIS — Z954 Presence of other heart-valve replacement: Secondary | ICD-10-CM

## 2011-11-25 DIAGNOSIS — D49519 Neoplasm of unspecified behavior of unspecified kidney: Secondary | ICD-10-CM

## 2011-11-29 ENCOUNTER — Encounter: Payer: Self-pay | Admitting: Family Medicine

## 2011-11-29 DIAGNOSIS — R3129 Other microscopic hematuria: Secondary | ICD-10-CM | POA: Insufficient documentation

## 2011-11-29 DIAGNOSIS — D4112 Neoplasm of uncertain behavior of left renal pelvis: Secondary | ICD-10-CM | POA: Insufficient documentation

## 2011-11-30 ENCOUNTER — Ambulatory Visit (HOSPITAL_COMMUNITY)
Admission: RE | Admit: 2011-11-30 | Discharge: 2011-11-30 | Disposition: A | Discharge status: Home / Self Care | Payer: Medicare Other | Source: Ambulatory Visit | Attending: Urology | Admitting: Urology

## 2011-11-30 DIAGNOSIS — N289 Disorder of kidney and ureter, unspecified: Secondary | ICD-10-CM | POA: Insufficient documentation

## 2011-11-30 DIAGNOSIS — D49519 Neoplasm of unspecified behavior of unspecified kidney: Secondary | ICD-10-CM

## 2011-11-30 MED ORDER — GADOBENATE DIMEGLUMINE 529 MG/ML IV SOLN
19.0000 mL | Freq: Once | INTRAVENOUS | Status: AC | PRN
  Administered 2011-11-30: 19 mL via INTRAVENOUS

## 2011-12-09 ENCOUNTER — Other Ambulatory Visit: Payer: Self-pay | Admitting: Urology

## 2011-12-09 DIAGNOSIS — N2889 Other specified disorders of kidney and ureter: Secondary | ICD-10-CM

## 2011-12-11 ENCOUNTER — Encounter: Payer: Self-pay | Admitting: Family Medicine

## 2011-12-16 ENCOUNTER — Other Ambulatory Visit: Payer: Self-pay | Admitting: Cardiology

## 2011-12-16 ENCOUNTER — Other Ambulatory Visit: Payer: Self-pay | Admitting: *Deleted

## 2011-12-16 MED ORDER — PRAVASTATIN SODIUM 40 MG PO TABS: 40.0000 mg | ORAL_TABLET | Freq: Every day | ORAL | Status: DC

## 2011-12-16 NOTE — Telephone Encounter (Signed)
Refilled Pravastatin

## 2011-12-23 ENCOUNTER — Ambulatory Visit (INDEPENDENT_AMBULATORY_CARE_PROVIDER_SITE_OTHER): Payer: Medicare Other | Admitting: *Deleted

## 2011-12-23 DIAGNOSIS — Z7901 Long term (current) use of anticoagulants: Secondary | ICD-10-CM

## 2011-12-23 DIAGNOSIS — Z954 Presence of other heart-valve replacement: Secondary | ICD-10-CM

## 2011-12-30 ENCOUNTER — Ambulatory Visit
Admission: RE | Admit: 2011-12-30 | Discharge: 2011-12-30 | Disposition: A | Discharge status: Home / Self Care | Payer: Medicare Other | Source: Ambulatory Visit | Attending: Urology | Admitting: Urology

## 2011-12-30 DIAGNOSIS — N2889 Other specified disorders of kidney and ureter: Secondary | ICD-10-CM

## 2011-12-31 ENCOUNTER — Telehealth: Payer: Self-pay | Admitting: Cardiology

## 2011-12-31 NOTE — Telephone Encounter (Signed)
Pt with a hx of AVR, last office visit 11-2011. Will forward for dr Jens Som review

## 2011-12-31 NOTE — Telephone Encounter (Signed)
Patient with atrial fibrillation and AVR; would bridge with lovenox while off coumadin. Juan Wilkerson

## 2011-12-31 NOTE — Telephone Encounter (Signed)
Pt is going to have a renal biopsy with crio ablation with Dr. Burley Saver and they need to know if the pt needs cardiac clearance and they want him to be off coumadin for 5 days. They are on epic so you can send her a message that way as well if needed

## 2012-01-01 NOTE — Progress Notes (Signed)
Patient ID: Juan Wilkerson, male   DOB: 1940-04-28, 71 y.o.   MRN: [id number]  NEW PATIENT OFFICE VISIT - LEVEL III [phone])  December 30, 2011  Juan Mc, Wilkerson [facility] [address] [place], [place] [address]  Re: Juan Wilkerson (DOB: 07/23/1940)  Dear Council Mechanic:  Thank you for sending Juan Wilkerson for consultation regarding percutaneous ablation to treat a left renal mass. The patient is a 71 year old male who is followed by Dr. Olga Wilkerson after prior aortic valve replacement and aortic root replacement surgery in 1995 for aneurysmal disease and valvular disease. The patient has been suspected to have underlying Marfan's disease and does have mild aneurysmal dilatation of the proximal descending thoracic aorta which is being followed. Surveillance CT recently on 11/17/11 revealed a solid enhancing lesion within the posterior aspect of the left upper kidney. This was further characterized by MRI showing a 2.3 cm mass emanating from the posterior left upper kidney with exophytic growth and demonstrating contrast enhancement.  The patient does have a history of microscopic hematuria with CT on 11/20/11 demonstrating no additional urologic abnormalities. He has no flank pain, gross hematuria or dysuria. The patient has not had any prior history of malignancy or renal surgery.  Past Medical History: 1. Prior aortic root and aortic valve replacement surgery, as above. The patient is chronically anticoagulated on Coumadin. Last INR on 12/23/11 was 3.7. 2. History of atrial fibrillation and sinus arrhythmia. 3. History of hypertension and hypercholesterolemia. The patient is followed in the [facility] by Dr. Demetria Wilkerson. 4. History of osteoarthritis of the knees.  Medications: Claritin 10 mg daily, Coumadin 3 mg daily, Norvasc 5 mg daily, Toprol XL 25 mg daily, pravastatin 40 mg daily, multivitamin daily.  Allergies: No known drug  allergies.  Social History: The patient is divorced and has one daughter. He lives in [place]. 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 He rarely drinks alcohol socially. He is a nonsmoker  Family History: No significant family history.  Page 2 Re: Juan Wilkerson (DOB: 07/03/1940)  Review of Systems: General: 6'4" in height and 200 pounds in weight. Ears, nose, throat: Some loss of hearing in the left ear. Gastrointestinal: Chronic constipation. No abdominal pain, nausea, vomiting or diarrhea. Genitourinary: Some frequency of urination. Some prostate enlargement. Cardiovascular: Irregular heart rate. No chest pain. Musculoskeletal: Chronic knee pain related to osteoarthritis. Neurologic: No neurological symptoms. Respiratory: No respiratory symptoms. Skin: History of contact dermatitis.  Physical Exam: Vital signs: Blood pressure 129/81, pulse 77. Respirations 14, temperature 97.8. Oxygen saturation 96% on room air. General: No acute distress. Chest: Clear to auscultation bilaterally. Heart: Irregular rate and rhythm. No audible murmurs. Audible artificial heart valve. Abdomen: Soft and nontender. No flank tenderness. Extremities: No edema.  Labs: BUN 19. Creatinine 0.88 and estimated GFR 86 ml/minute.  Imaging: I personally reviewed all prior imaging of the chest and abdomen. Based on my own personal measurements, the posterior left renal mass measures approximately 2.0 x 2.2 cm by MRI and 2 x 2.5 x 2.1 cm by CT. The mass shows irregular contrast enhancement, shows exophytic growth pattern and partial endophytic component without extension into the central kidney. There is no evidence of renal vein tumor, lymphadenopathy or extrarenal metastatic disease. No suspicious pulmonary nodules are present in the chest. In retrospect, the top portion of the mass was barely visualized on CT studies of the chest on 09/18/10 and 05/20/09. It was  not definitively identified by CT in 2009. Based on imaging appearance, this most likely is a renal carcinoma.  Assessment: I met with Juan Wilkerson and reviewed imaging findings directly with him. As you have indicated, based on medical comorbidities and chronic anticoagulation, the patient is not an ideal candidate for partial nephrectomy. The lesion is posteriorly located, partially exophytic and of size amenable to percutaneous ablation. Details of percutaneous cryoablation were reviewed with the patient, including risks. The procedure would involve limited time under general anesthesia and overnight hospital stay. Separate biopsy of the lesion is not necessary prior to ablation given high suspicion of malignancy. Biopsy would be performed at the time of ablation to establish a tissue diagnosis.  After discussion, the patient would like to proceed with scheduling percutaneous ablation. Given his prior history, I would like to consult Dr. Jens Wilkerson for cardiac clearance prior to the procedure. We will also have to coordinate bridging the patient with Lovenox prior to an elective procedure.  Thank you again for sending Juan Wilkerson for consultation and allowing me to participate in his care. I will keep you updated with plans for scheduling cryoablation.  Sincerely,  Juan Lack, Wilkerson Juan Wilkerson  C: Juan Wilkerson Juan Wilkerson

## 2012-01-04 NOTE — Telephone Encounter (Signed)
Will forward note to [facility] clinic and tammy bangle at GI

## 2012-01-04 NOTE — Telephone Encounter (Signed)
Pt Coumadin is followed by [facility]. Will forward message to Chancy Milroy to bridge with Lovenox for upcoming procedure.

## 2012-01-06 ENCOUNTER — Ambulatory Visit (INDEPENDENT_AMBULATORY_CARE_PROVIDER_SITE_OTHER): Payer: Medicare Other | Admitting: *Deleted

## 2012-01-06 ENCOUNTER — Encounter: Payer: Self-pay | Admitting: Family Medicine

## 2012-01-06 DIAGNOSIS — Z954 Presence of other heart-valve replacement: Secondary | ICD-10-CM

## 2012-01-06 DIAGNOSIS — Z7901 Long term (current) use of anticoagulants: Secondary | ICD-10-CM

## 2012-01-06 LAB — POCT INR: INR: 2.7

## 2012-01-06 NOTE — Progress Notes (Signed)
Patient to have cryo ablation for renal mass possibly on Dec. 13, per phone note from Dr. Jens Som pt will need to be off warfarin 5 days prior and bridge with lovenox

## 2012-01-25 ENCOUNTER — Other Ambulatory Visit: Payer: Self-pay | Admitting: Oncology

## 2012-01-25 ENCOUNTER — Telehealth: Payer: Self-pay | Admitting: *Deleted

## 2012-01-25 ENCOUNTER — Other Ambulatory Visit: Payer: Self-pay | Admitting: Cardiology

## 2012-01-25 ENCOUNTER — Other Ambulatory Visit (HOSPITAL_COMMUNITY): Payer: Self-pay | Admitting: Interventional Radiology

## 2012-01-25 DIAGNOSIS — N2889 Other specified disorders of kidney and ureter: Secondary | ICD-10-CM

## 2012-01-25 NOTE — Telephone Encounter (Signed)
Mr. Millirons is scheduled to have cryo ablation with Dr. Fredia Sorrow on 02/19/2012.  Per protocol, Mr. Allerton will need stop his Coumadin and be bridged with Lovenox 100 mg 5 days prior to procedure.  Will need to hold Lovenox on 02/18/12, one day prior to procedure.  Will schedule Mr. Bransfield in pharmacy clinic on 02/12/2012 for injection teaching.  Will have Dr. Fara Boros send in prescription for Lovenox 100mg , 10 doses with 1 refill per Dr. Raymondo Band.  Kathrine Cords, Nori Riis

## 2012-01-25 NOTE — Telephone Encounter (Signed)
Will send in Lovenox 100mg  BID prior to his appt with Dr. Raymondo Band 02/12/12.  Will wait until it is closer to the time he needs the medication.

## 2012-02-02 MED ORDER — ENOXAPARIN SODIUM 100 MG/ML ~~LOC~~ SOLN: 100.0000 mg | Freq: Two times a day (BID) | SUBCUTANEOUS | Status: DC

## 2012-02-02 NOTE — Telephone Encounter (Signed)
Lovenox sent to Pharmacy today so that patient will be able to be pick it up before 02/12/12.

## 2012-02-03 ENCOUNTER — Ambulatory Visit (INDEPENDENT_AMBULATORY_CARE_PROVIDER_SITE_OTHER): Payer: Medicare Other | Admitting: *Deleted

## 2012-02-03 DIAGNOSIS — Z7901 Long term (current) use of anticoagulants: Secondary | ICD-10-CM

## 2012-02-03 DIAGNOSIS — Z954 Presence of other heart-valve replacement: Secondary | ICD-10-CM

## 2012-02-12 ENCOUNTER — Ambulatory Visit (INDEPENDENT_AMBULATORY_CARE_PROVIDER_SITE_OTHER): Payer: Medicare Other | Admitting: *Deleted

## 2012-02-12 ENCOUNTER — Encounter: Payer: Self-pay | Admitting: Pharmacist

## 2012-02-12 ENCOUNTER — Ambulatory Visit (INDEPENDENT_AMBULATORY_CARE_PROVIDER_SITE_OTHER): Payer: Medicare Other | Admitting: Pharmacist

## 2012-02-12 DIAGNOSIS — Z954 Presence of other heart-valve replacement: Secondary | ICD-10-CM

## 2012-02-12 DIAGNOSIS — Z7901 Long term (current) use of anticoagulants: Secondary | ICD-10-CM

## 2012-02-12 LAB — POCT INR: INR: 2.6

## 2012-02-12 NOTE — Progress Notes (Signed)
  Subjective:    Patient ID: Juan Wilkerson, male    DOB: [birth year], 71 y.o.   MRN: [id number]  HPI Patient arrives in good spirits for Lovenox education and injection technique instruction.   Patient reports having no experience with injections.   He is willing to learn and do self-injection.   He reports having a scheduled procedure next Friday at 8AM.   He was told to be transitioned off warfarin for the procedure.     Review of Systems     Objective:   Physical Exam  INR in office today = 2.6      Assessment & Plan:  Long-Term anticoagulation in a patient with artificial heart valve requiring bridging with lovenox for procedure next Friday (7 days from now).  Patient demonstrated adequate technique for performing self-injection following instruction using sterile water and subcutaneous injection syringes.     Patient verbalized understanding of plan to use warfarin 3mg  today, and 1.5 mg tomorrow.  Then on Sunday 12/8 begin twice daily dosing of Lovenox 100mg  (syringes were present at visit today).   He will take his last dose at [facility] on Thursday PM on 02/18/2012.   He will receive instructions from the inpatient treatment team on reinitiation of warfarin and Lovenox post procedure.   He was educated that he may need an additional 5 days of lovenox post-procedure to minimize a hypercoaguable period post procedure while Warfarin is regaining therapeutic INR.   Total time in counseling and education 20 minutes.   Patient expressed understanding of treatment plan and was provided treatment calendar.   Patient seen with Juanita Craver, PharmD candidate.

## 2012-02-12 NOTE — Progress Notes (Signed)
Patient ID: Juan Wilkerson, male   DOB: [birth year], 71 y.o.   MRN: [id number] Reviewed and agree with Dr. Macky Lower documentation and management.

## 2012-02-12 NOTE — Assessment & Plan Note (Signed)
Long-Term anticoagulation in a patient with artificial heart valve requiring bridging with lovenox for procedure next Friday (7 days from now).  Patient demonstrated adequate technique for performing self-injection following instruction using sterile water and subcutaneous injection syringes.     Patient verbalized understanding of plan to use warfarin 3mg  today, and 1.5 mg tomorrow.  Then on Sunday 12/8 begin twice daily dosing of Lovenox 100mg  (syringes were present at visit today).   He will take his last dose at [facility] on Thursday PM on 02/18/2012.   He will receive instructions from the inpatient treatment team on reinitiation of warfarin and Lovenox post procedure.   He was educated that he may need an additional 5 days of lovenox post-procedure to minimize a hypercoaguable period post procedure while Warfarin is regaining therapeutic INR.   Total time in counseling and education 20 minutes.   Patient expressed understanding of treatment plan and was provided treatment calendar.   Patient seen with Juanita Craver, PharmD candidate.

## 2012-02-12 NOTE — Patient Instructions (Signed)
See treatment calendar.   Thanks for coming in for education.

## 2012-02-13 ENCOUNTER — Encounter (HOSPITAL_COMMUNITY): Payer: Self-pay | Admitting: Pharmacist

## 2012-02-15 NOTE — Patient Instructions (Addendum)
20 Juan Wilkerson  02/15/2012   Your procedure is scheduled on:  02/19/12  FRIDAY  Report to [facility] at    0630   AM.  Call this number if you have problems the morning of surgery: [phone]       Remember:   Do not eat food  Or drink :After Midnight. Thursday NIGHT   Take these medicines the morning of surgery with A SIP OF WATER:  NORVASC,  METOPROLOL   .  Contacts, dentures or partial plates can not be worn to surgery  Leave suitcase in the car. After surgery it may be brought to your room.  For patients admitted to the hospital, checkout time is 11:00 AM day of  discharge.             SPECIAL INSTRUCTIONS- SEE [place] PREPARING FOR SURGERY INSTRUCTION SHEET-     DO NOT WEAR JEWELRY, LOTIONS, POWDERS, OR PERFUMES. Patients discharged the day of surgery will not be allowed to drive home. IF going home the day of surgery, you must have a driver and someone to stay with you for the first 24 hours  Name and phone number of your driver: overnight stay                                                                       Please read over the following fact sheets that you were given: MRSA Information, Incentive Spirometry Sheet, Blood Transfusion Sheet  Information                                                                                   Delphina Schum  PST [phone]  [phone]                 FAILURE TO FOLLOW THESE INSTRUCTIONS MAY RESULT IN  CANCELLATION   OF YOUR SURGERY                                                  Patient Signature _____________________________

## 2012-02-16 ENCOUNTER — Other Ambulatory Visit: Payer: Self-pay | Admitting: Radiology

## 2012-02-16 ENCOUNTER — Encounter (HOSPITAL_COMMUNITY)
Admission: RE | Admit: 2012-02-16 | Discharge: 2012-02-16 | Disposition: A | Discharge status: Home / Self Care | Payer: Medicare Other | Source: Ambulatory Visit | Attending: Interventional Radiology | Admitting: Interventional Radiology

## 2012-02-16 ENCOUNTER — Encounter (HOSPITAL_COMMUNITY): Payer: Self-pay

## 2012-02-16 HISTORY — DX: Personal history of other medical treatment: Z92.89

## 2012-02-16 LAB — CBC
Hemoglobin: 15.2 g/dL (ref 13.0–17.0)
MCHC: 33.3 g/dL (ref 30.0–36.0)
Platelets: 140 10*3/uL — ABNORMAL LOW (ref 150–400)
RDW: 13.1 % (ref 11.5–15.5)

## 2012-02-16 LAB — BASIC METABOLIC PANEL
Calcium: 9.5 mg/dL (ref 8.4–10.5)
Creatinine, Ser: 0.76 mg/dL (ref 0.50–1.35)
GFR calc Af Amer: >90 mL/min (ref >90)

## 2012-02-16 LAB — PROTIME-INR
INR: 1.29 (ref 0.00–1.49)
Prothrombin Time: 15.8 seconds — ABNORMAL HIGH (ref 11.6–15.2)

## 2012-02-16 LAB — APTT: aPTT: 43 seconds — ABNORMAL HIGH (ref 24–37)

## 2012-02-16 NOTE — Progress Notes (Signed)
Chest Ct, EKG, eccho  9/13 EPIC, LOV Dr Jens Som 9/13 EPIC.  Patient states will take last dose Lovonox pre op night before surgery

## 2012-02-19 ENCOUNTER — Encounter (HOSPITAL_COMMUNITY)
Admission: RE | Disposition: A | Discharge status: Home / Self Care | Payer: Self-pay | Source: Ambulatory Visit | Attending: Interventional Radiology

## 2012-02-19 ENCOUNTER — Encounter (HOSPITAL_COMMUNITY): Payer: Self-pay

## 2012-02-19 ENCOUNTER — Encounter (HOSPITAL_COMMUNITY): Payer: Self-pay | Admitting: *Deleted

## 2012-02-19 ENCOUNTER — Observation Stay (HOSPITAL_COMMUNITY)
Admission: RE | Admit: 2012-02-19 | Discharge: 2012-02-20 | Disposition: A | Discharge status: Home / Self Care | Payer: Medicare Other | Source: Ambulatory Visit | Attending: Interventional Radiology | Admitting: Interventional Radiology

## 2012-02-19 ENCOUNTER — Ambulatory Visit (HOSPITAL_COMMUNITY): Payer: Medicare Other | Admitting: Registered Nurse

## 2012-02-19 ENCOUNTER — Ambulatory Visit (HOSPITAL_COMMUNITY)
Admission: RE | Admit: 2012-02-19 | Discharge: 2012-02-19 | Disposition: A | Discharge status: Home / Self Care | Payer: Medicare Other | Source: Ambulatory Visit | Attending: Interventional Radiology | Admitting: Interventional Radiology

## 2012-02-19 ENCOUNTER — Encounter (HOSPITAL_COMMUNITY): Payer: Self-pay | Admitting: Registered Nurse

## 2012-02-19 DIAGNOSIS — Q8740 Marfan's syndrome, unspecified: Secondary | ICD-10-CM | POA: Insufficient documentation

## 2012-02-19 DIAGNOSIS — J069 Acute upper respiratory infection, unspecified: Secondary | ICD-10-CM

## 2012-02-19 DIAGNOSIS — D3000 Benign neoplasm of unspecified kidney: Secondary | ICD-10-CM | POA: Insufficient documentation

## 2012-02-19 DIAGNOSIS — R3129 Other microscopic hematuria: Secondary | ICD-10-CM

## 2012-02-19 DIAGNOSIS — E7800 Pure hypercholesterolemia, unspecified: Secondary | ICD-10-CM

## 2012-02-19 DIAGNOSIS — N289 Disorder of kidney and ureter, unspecified: Secondary | ICD-10-CM | POA: Insufficient documentation

## 2012-02-19 DIAGNOSIS — N2889 Other specified disorders of kidney and ureter: Secondary | ICD-10-CM

## 2012-02-19 DIAGNOSIS — M159 Polyosteoarthritis, unspecified: Secondary | ICD-10-CM

## 2012-02-19 DIAGNOSIS — Z1211 Encounter for screening for malignant neoplasm of colon: Secondary | ICD-10-CM

## 2012-02-19 DIAGNOSIS — Z954 Presence of other heart-valve replacement: Secondary | ICD-10-CM | POA: Insufficient documentation

## 2012-02-19 DIAGNOSIS — Z7901 Long term (current) use of anticoagulants: Secondary | ICD-10-CM | POA: Insufficient documentation

## 2012-02-19 DIAGNOSIS — I10 Essential (primary) hypertension: Secondary | ICD-10-CM | POA: Insufficient documentation

## 2012-02-19 DIAGNOSIS — D4112 Neoplasm of uncertain behavior of left renal pelvis: Secondary | ICD-10-CM

## 2012-02-19 DIAGNOSIS — Z79899 Other long term (current) drug therapy: Secondary | ICD-10-CM | POA: Insufficient documentation

## 2012-02-19 DIAGNOSIS — M25569 Pain in unspecified knee: Secondary | ICD-10-CM

## 2012-02-19 DIAGNOSIS — I4892 Unspecified atrial flutter: Secondary | ICD-10-CM

## 2012-02-19 DIAGNOSIS — Z01812 Encounter for preprocedural laboratory examination: Secondary | ICD-10-CM | POA: Insufficient documentation

## 2012-02-19 DIAGNOSIS — I712 Thoracic aortic aneurysm, without rupture: Secondary | ICD-10-CM

## 2012-02-19 DIAGNOSIS — I4891 Unspecified atrial fibrillation: Secondary | ICD-10-CM | POA: Insufficient documentation

## 2012-02-19 DIAGNOSIS — E785 Hyperlipidemia, unspecified: Secondary | ICD-10-CM | POA: Insufficient documentation

## 2012-02-19 DIAGNOSIS — R079 Chest pain, unspecified: Secondary | ICD-10-CM

## 2012-02-19 DIAGNOSIS — M1710 Unilateral primary osteoarthritis, unspecified knee: Secondary | ICD-10-CM

## 2012-02-19 LAB — TYPE AND SCREEN: ABO/RH(D): A POS

## 2012-02-19 SURGERY — RADIO FREQUENCY ABLATION
Anesthesia: General | Laterality: Left | Wound class: Clean

## 2012-02-19 MED ORDER — ENOXAPARIN SODIUM 100 MG/ML ~~LOC~~ SOLN
100.0000 mg | Freq: Once | SUBCUTANEOUS | Status: AC
  Administered 2012-02-20: 100 mg via SUBCUTANEOUS
  Filled 2012-02-19: qty 1

## 2012-02-19 MED ORDER — METOPROLOL SUCCINATE ER 25 MG PO TB24: 25.0000 mg | ORAL_TABLET | Freq: Every day | ORAL | Status: DC

## 2012-02-19 MED ORDER — SENNOSIDES-DOCUSATE SODIUM 8.6-50 MG PO TABS
1.0000 | ORAL_TABLET | Freq: Every day | ORAL | Status: DC | PRN
  Filled 2012-02-19: qty 1

## 2012-02-19 MED ORDER — NEOSTIGMINE METHYLSULFATE 1 MG/ML IJ SOLN
INTRAMUSCULAR | Status: DC | PRN
  Administered 2012-02-19: 5 mg via INTRAVENOUS

## 2012-02-19 MED ORDER — PROPOFOL 10 MG/ML IV BOLUS
INTRAVENOUS | Status: DC | PRN
  Administered 2012-02-19: 50 mg via INTRAVENOUS
  Administered 2012-02-19: 150 mg via INTRAVENOUS

## 2012-02-19 MED ORDER — GLYCOPYRROLATE 0.2 MG/ML IJ SOLN
INTRAMUSCULAR | Status: DC | PRN
  Administered 2012-02-19: .8 mg via INTRAVENOUS

## 2012-02-19 MED ORDER — EPHEDRINE SULFATE 50 MG/ML IJ SOLN
INTRAMUSCULAR | Status: DC | PRN
  Administered 2012-02-19 (×5): 5 mg via INTRAVENOUS

## 2012-02-19 MED ORDER — PHENYLEPHRINE HCL 10 MG/ML IJ SOLN
INTRAMUSCULAR | Status: DC | PRN
  Administered 2012-02-19 (×2): 40 ug via INTRAVENOUS
  Administered 2012-02-19: 80 ug via INTRAVENOUS
  Administered 2012-02-19: 40 ug via INTRAVENOUS

## 2012-02-19 MED ORDER — METOPROLOL SUCCINATE ER 25 MG PO TB24
25.0000 mg | ORAL_TABLET | Freq: Every day | ORAL | Status: DC
  Filled 2012-02-19: qty 1

## 2012-02-19 MED ORDER — ROCURONIUM BROMIDE 100 MG/10ML IV SOLN
INTRAVENOUS | Status: DC | PRN
  Administered 2012-02-19: 5 mg via INTRAVENOUS
  Administered 2012-02-19: 40 mg via INTRAVENOUS
  Administered 2012-02-19: 5 mg via INTRAVENOUS

## 2012-02-19 MED ORDER — MIDAZOLAM HCL 5 MG/5ML IJ SOLN
INTRAMUSCULAR | Status: DC | PRN
  Administered 2012-02-19 (×2): 1 mg via INTRAVENOUS

## 2012-02-19 MED ORDER — AMLODIPINE BESYLATE 5 MG PO TABS
5.0000 mg | ORAL_TABLET | Freq: Every day | ORAL | Status: DC
  Filled 2012-02-19: qty 1

## 2012-02-19 MED ORDER — CEFAZOLIN SODIUM-DEXTROSE 2-3 GM-% IV SOLR
2.0000 g | Freq: Once | INTRAVENOUS | Status: AC
  Administered 2012-02-19: 2 g via INTRAVENOUS
  Filled 2012-02-19: qty 50

## 2012-02-19 MED ORDER — LIDOCAINE HCL (CARDIAC) 20 MG/ML IV SOLN
INTRAVENOUS | Status: DC | PRN
  Administered 2012-02-19: 80 mg via INTRAVENOUS

## 2012-02-19 MED ORDER — SIMVASTATIN 20 MG PO TABS
20.0000 mg | ORAL_TABLET | Freq: Every day | ORAL | Status: DC
  Filled 2012-02-19: qty 1

## 2012-02-19 MED ORDER — DOCUSATE SODIUM 100 MG PO CAPS
100.0000 mg | ORAL_CAPSULE | Freq: Two times a day (BID) | ORAL | Status: DC
  Administered 2012-02-19 – 2012-02-20 (×3): 100 mg via ORAL
  Filled 2012-02-19 (×4): qty 1

## 2012-02-19 MED ORDER — ONDANSETRON HCL 4 MG/2ML IJ SOLN
INTRAMUSCULAR | Status: DC | PRN
  Administered 2012-02-19: 4 mg via INTRAVENOUS

## 2012-02-19 MED ORDER — SIMVASTATIN 20 MG PO TABS: 20.0000 mg | ORAL_TABLET | Freq: Every day | ORAL | Status: DC

## 2012-02-19 MED ORDER — FENTANYL CITRATE 0.05 MG/ML IJ SOLN: 25.0000 ug | INTRAMUSCULAR | Status: DC | PRN

## 2012-02-19 MED ORDER — HYDROCODONE-ACETAMINOPHEN 5-325 MG PO TABS: 1.0000 | ORAL_TABLET | ORAL | Status: DC | PRN

## 2012-02-19 MED ORDER — LACTATED RINGERS IV SOLN
INTRAVENOUS | Status: DC
  Administered 2012-02-19: 1000 mL via INTRAVENOUS
  Administered 2012-02-19: 09:00:00 via INTRAVENOUS

## 2012-02-19 MED ORDER — FENTANYL CITRATE 0.05 MG/ML IJ SOLN
INTRAMUSCULAR | Status: DC | PRN
  Administered 2012-02-19: 100 ug via INTRAVENOUS

## 2012-02-19 MED ORDER — ONDANSETRON HCL 4 MG/2ML IJ SOLN: 4.0000 mg | Freq: Four times a day (QID) | INTRAMUSCULAR | Status: DC | PRN

## 2012-02-19 MED ORDER — AMLODIPINE BESYLATE 5 MG PO TABS: 5.0000 mg | ORAL_TABLET | Freq: Every day | ORAL | Status: DC

## 2012-02-19 MED ORDER — PROMETHAZINE HCL 25 MG/ML IJ SOLN: 6.2500 mg | INTRAMUSCULAR | Status: DC | PRN

## 2012-02-19 NOTE — Anesthesia Postprocedure Evaluation (Signed)
  Anesthesia Post-op Note  Patient: Juan Wilkerson  Procedure(s) Performed: Procedure(s) (LRB): RADIO FREQUENCY ABLATION (Left)  Patient Location: PACU  Anesthesia Type: General  Level of Consciousness: awake and alert   Airway and Oxygen Therapy: Patient Spontanous Breathing  Post-op Pain: mild  Post-op Assessment: Post-op Vital signs reviewed, Patient's Cardiovascular Status Stable, Respiratory Function Stable, Patent Airway and No signs of Nausea or vomiting  Last Vitals:  Filed Vitals:   02/19/12 1245  BP: 137/82  Pulse: 59  Temp:   Resp: 19    Post-op Vital Signs: stable   Complications: No apparent anesthesia complications

## 2012-02-19 NOTE — H&P (Signed)
Referring Physician:Borden HPI: Juan Wilkerson is an 71 y.o. male with newly found left renal mass. He was seen in consultation by Dr. Fredia Sorrow, see PACS IR Rad Eval from 01/01/12. They discussed cryoablation as treatment option and he is scheduled today for procedure. He has been cleared by his Cardiologist to have procedure and his Coumadin has been held and he has been on a Lovenox bridge which he took up until last pm. He pre-assessment labs show his INR to be sub-therapeutic. He has been feeling well otherwise, no recent illnesses or fevers. PMHx and meds reviewed as below.  Past Medical History:  Past Medical History  Diagnosis Date  . Marfan's syndrome with aortic dilation   . Hypertension   . Aortic aneurysm 1994    From presumed Marfan's Syndrome.  Followed by Dr Jens Som.  Aneurysm involving the proximal descending thoracic aorta .    Marland Kitchen Pulmonary nodules     Found on chest CT 08/2007.  Monitoring with yearly CT.  3.75mm nodule in R iddle lobe.  Marland Kitchen Hyperlipidemia   . Atrial fibrillation     Followed by Dr Jens Som.  On coumadin.  . Atrial flutter     Followed by Dr Jens Som  . Osteoarthritis   . History of blood transfusion     Past Surgical History:  Past Surgical History  Procedure Date  . Virtual colonoscopy 11/01/2005    Pt on chronic coumadin for Aortic valve replacement and Atrial fibrilliation therefore at high risk if stopped.  Need to repeat every 5 yrs.   . Transthoracic echocardiogram 05/2009    EF 55-60%, atrium mildly dilated  . Aortic valve replacement 03/1993    St Jude Valve  . Hip fracture surgery 03/1992    s/p R hip Fx  . Vascular surgery   . Cardiac valve replacement     Family History:  Family History  Problem Relation Age of Onset  . Cancer Brother   . Hypertension Brother   . Aortic aneurysm Maternal Aunt     Social History:  reports that he has never smoked. He has never used smokeless tobacco. He reports that he does not drink alcohol or use  illicit drugs.  Allergies: No Known Allergies  Medications: acetaminophen (TYLENOL) 500 MG tablet (Taking) Sig - Route: Take 1,000 mg by mouth 2 (two) times daily as needed. As needed for pain - Oral Class: Historical Med Number of times this order has been changed since signing: 2 Order [address] amLODipine (NORVASC) 5 MG tablet (Taking) 30 tablet 12 11/10/2011 Sig - Route: Take 1 tablet (5 mg total) by mouth daily. - Oral enoxaparin (LOVENOX) 100 MG/ML injection (Taking) 10 mL 1 01/25/2012 Sig - Route: Inject 1 mL (100 mg total) into the skin every 12 (twelve) hours. - Subcutaneous Glucosamine-Chondroit-Vit C-Mn (GLUCOSAMINE-CHONDROITIN) TABS (Taking) Sig - Route: Take 2 tablets by mouth daily. - Oral Class: Historical Med Number of times this order has been changed since signing: 2 Order [address]it Trail hydrocortisone 1 % cream (Taking) Sig - Route: Apply topically. Apply as needed for itching. - Topical Class: Historical Med Number of times this order has been changed since signing: 1 Order [address]it Trail metoprolol succinate (TOPROL XL) 25 MG 24 hr tablet (Taking) 30 tablet 11 11/10/2011 11/09/2012 Sig - Route: Take 1 tablet (25 mg total) by mouth daily. - Oral Multiple Vitamin (MULTIVITAMIN) capsule (Taking) Sig - Route: Take 1 capsule by mouth daily. - Oral Class: Historical Med Number of times this order has  been changed since signing: 1 Order Audit Trail warfarin (COUMADIN) 3 MG tablet (Taking) 31 tablet 3 10/22/2011 Sig: TAKE 1 TABLET BY MOUTH DAILY. TAKE AS DIRECTED   Please HPI for pertinent positives, otherwise complete 10 system ROS negative.  Physical Exam: Temp: 98.7 HR: 94 RR: 20 BP: 139/88 O2: 98%   General Appearance:  Alert, cooperative, no distress, appears stated age  Head:  Normocephalic, without obvious abnormality, atraumatic  ENT: Unremarkable  Neck: Supple, symmetrical, trachea midline, no adenopathy, thyroid: not enlarged, symmetric, no tenderness/mass/nodules  Lungs:   Clear to  auscultation bilaterally, no w/r/r, respirations unlabored without use of accessory muscles.  Heart:  Irreg irreg, mech valve   Abdomen:   Soft, non-tender, non distended.   Skin: Skin color, texture, turgor normal, no rashes or lesions  Neurologic: Normal affect, no gross deficits.   Results for orders placed during the hospital encounter of 02/19/12 (from the past 48 hour(s))  TYPE AND SCREEN     Status: Normal (Preliminary result)   Collection Time   02/19/12  7:05 AM      Component Value Range Comment   ABO/RH(D) A POS      Antibody Screen PENDING      Sample Expiration 02/22/2012      CBC    Component Value Date/Time   WBC 6.2 02/16/2012 0910   RBC 5.16 02/16/2012 0910   HGB 15.2 02/16/2012 0910   HCT 45.6 02/16/2012 0910   PLT 140* 02/16/2012 0910   MCV 88.4 02/16/2012 0910   MCH 29.5 02/16/2012 0910   MCHC 33.3 02/16/2012 0910   RDW 13.1 02/16/2012 0910    BMET    Component Value Date/Time   NA 137 02/16/2012 0910   K 4.0 02/16/2012 0910   CL 101 02/16/2012 0910   CO2 28 02/16/2012 0910   GLUCOSE 95 02/16/2012 0910   BUN 17 02/16/2012 0910   CREATININE 0.76 02/16/2012 0910   CREATININE 0.79 08/15/2010 0845   CALCIUM 9.5 02/16/2012 0910   GFRNONAA 89* 02/16/2012 0910   GFRAA >90 02/16/2012 0910    Prothrombin Time/INR 1.29    Assessment/Plan (L)renal mass Afib, Coumadin held, on Lovenox bridge-to resume post procedure. For CT guided cryoablation procedure under general anesthesia Discussed procedure, including risks, complications and anticipated overnight stay for observation. Labs reviewed, ok Consent signed in chart  Brayton El PA-C 02/19/2012, 7:51 AM

## 2012-02-19 NOTE — Procedures (Signed)
Procedure:  CT guided core biopsy and cryoablation of left renal mass Findings:  Two 18 G core bx samples via 17 G needle.  Cryoablation of lesion with 3 separate Galil Ice Rod Plus probes. Plan:  PACU recovery followed by overnight observation

## 2012-02-19 NOTE — Transfer of Care (Signed)
Immediate Anesthesia Transfer of Care Note  Patient: Juan Wilkerson  Procedure(s) Performed: Procedure(s) (LRB) with comments: RADIO FREQUENCY ABLATION (Left) - Left Renal Cyro Ablation  Patient Location: PACU  Anesthesia Type:General  Level of Consciousness: awake, alert  and oriented  Airway & Oxygen Therapy: Patient Spontanous Breathing and Patient connected to face mask oxygen  Post-op Assessment: Report given to PACU RN and Post -op Vital signs reviewed and stable  Post vital signs: Reviewed  Complications: No apparent anesthesia complications

## 2012-02-19 NOTE — Anesthesia Preprocedure Evaluation (Addendum)
Anesthesia Evaluation  Patient identified by MRN, date of birth, ID band Patient awake    Reviewed: Allergy & Precautions, H&P , NPO status , Patient's Chart, lab work & pertinent test results, reviewed documented beta blocker date and time   History of Anesthesia Complications (+) AWARENESS UNDER ANESTHESIA  Airway Mallampati: II TM Distance: >3 FB Neck ROM: full    Dental No notable dental hx.    Pulmonary neg pulmonary ROS,  breath sounds clear to auscultation  Pulmonary exam normal       Cardiovascular Exercise Tolerance: Good hypertension, On Medications and On Home Beta Blockers + Peripheral Vascular Disease + dysrhythmias Atrial Fibrillation Rhythm:regular Rate:Normal     Neuro/Psych negative neurological ROS  negative psych ROS   GI/Hepatic negative GI ROS, Neg liver ROS,   Endo/Other  negative endocrine ROS  Renal/GU Renal diseasenegative Renal ROS  negative genitourinary   Musculoskeletal   Abdominal   Peds  Hematology negative hematology ROS (+)   Anesthesia Other Findings   Reproductive/Obstetrics negative OB ROS                          Anesthesia Physical Anesthesia Plan  ASA: III  Anesthesia Plan: General   Post-op Pain Management:    Induction: Intravenous  Airway Management Planned: Oral ETT  Additional Equipment:   Intra-op Plan:   Post-operative Plan:   Informed Consent: I have reviewed the patients History and Physical, chart, labs and discussed the procedure including the risks, benefits and alternatives for the proposed anesthesia with the patient or authorized representative who has indicated his/her understanding and acceptance.   Dental Advisory Given  Plan Discussed with: CRNA  Anesthesia Plan Comments:        Anesthesia Quick Evaluation

## 2012-02-19 NOTE — Progress Notes (Signed)
  Subjective: Feeling well after procedure.  No complaints.  Slight pain earlier now gone.  Objective: Vital signs in last 24 hours: Temp:  [97.5 F (36.4 C)-98.7 F (37.1 C)] 98.2 F (36.8 C) (12/13 1528) Pulse Rate:  [58-119] 75  (12/13 1528) Resp:  [14-21] 18  (12/13 1528) BP: (112-154)/(71-100) 130/74 mmHg (12/13 1528) SpO2:  [93 %-100 %] 95 % (12/13 1528) Weight:  [200 lb (90.719 kg)] 200 lb (90.719 kg) (12/13 1528)    Intake/Output from previous day:   Intake/Output this shift: Total I/O In: 615 [P.O.:615] Out: 600 [Urine:600]  Left flank:  Nontender.  No ecchymosis.  Urine clear.  Lab Results:  No results found for this basename: WBC:2,HGB:2,HCT:2,PLT:2 in the last 72 hours BMET No results found for this basename: NA:2,K:2,CL:2,CO2:2,GLUCOSE:2,BUN:2,CREATININE:2,CALCIUM:2 in the last 72 hours PT/INR No results found for this basename: LABPROT:2,INR:2 in the last 72 hours ABG No results found for this basename: PHART:2,PCO2:2,PO2:2,HCO3:2 in the last 72 hours  Studies/Results: No results found.  Anti-infectives: Anti-infectives    None      Assessment/Plan: s/p Left renal mass cryoablation Check labs in AM Plan for discharge tomorrow  LOS: 0 days    Juan Wilkerson T 02/19/2012

## 2012-02-19 NOTE — H&P (Signed)
Agree 

## 2012-02-19 NOTE — Preoperative (Signed)
Beta Blockers   Reason not to administer Beta Blockers:Metoprolol taken 02-19-12 at [id number]

## 2012-02-19 NOTE — Anesthesia Procedure Notes (Signed)
Procedure Name: Intubation Performed by: Jarvis Newcomer A Pre-anesthesia Checklist: Emergency Drugs available, Patient identified, Patient being monitored and Suction available Patient Re-evaluated:Patient Re-evaluated prior to inductionOxygen Delivery Method: Circle system utilized Preoxygenation: Pre-oxygenation with 100% oxygen Intubation Type: IV induction Ventilation: Two handed mask ventilation required, Oral airway inserted - appropriate to patient size and Mask ventilation without difficulty Laryngoscope Size: Mac and 4 Grade View: Grade III Tube type: Oral Tube size: 7.5 mm Number of attempts: 2 Airway Equipment and Method: Stylet and Patient positioned with wedge pillow Placement Confirmation: ETT inserted through vocal cords under direct vision,  breath sounds checked- equal and bilateral and positive ETCO2 Secured at: 23 cm Tube secured with: Tape Dental Injury: Teeth and Oropharynx as per pre-operative assessment

## 2012-02-20 LAB — CBC
HCT: 38.8 % — ABNORMAL LOW (ref 39.0–52.0)
MCH: 30.3 pg (ref 26.0–34.0)
MCV: 89.6 fL (ref 78.0–100.0)
RBC: 4.33 MIL/uL (ref 4.22–5.81)
WBC: 7.7 10*3/uL (ref 4.0–10.5)

## 2012-02-20 LAB — BASIC METABOLIC PANEL
CO2: 29 mEq/L (ref 19–32)
Chloride: 101 mEq/L (ref 96–112)
Glucose, Bld: 110 mg/dL — ABNORMAL HIGH (ref 70–99)
Sodium: 137 mEq/L (ref 135–145)

## 2012-02-20 MED ORDER — ACETAMINOPHEN 500 MG PO TABS: 500.0000 mg | ORAL_TABLET | Freq: Once | ORAL | Status: DC

## 2012-02-20 MED ORDER — MENTHOL 3 MG MT LOZG
1.0000 | LOZENGE | OROMUCOSAL | Status: DC | PRN
  Filled 2012-02-20: qty 9

## 2012-02-20 MED ORDER — ACETAMINOPHEN 500 MG PO TABS: 500.0000 mg | ORAL_TABLET | Freq: Once | ORAL | Status: DC | PRN

## 2012-02-20 NOTE — Discharge Summary (Signed)
Physician Discharge Summary  Patient ID: Juan Wilkerson MRN: [id number] DOB/AGE: [birth year] 71 y.o.  Admit date: 02/19/2012 Discharge date: 02/20/2012  Admission Diagnoses: Left renal mass  Discharge Diagnoses: Left renal mass; cryoablation  Active problems: Aortic valve replacement; Marfans syndrome; Atrial Fib; HTN; HLD  Discharged Condition: stable  Hospital Course: Left renal mass cryoablation performed in Interventional Radiology 12/13 by Dr Irish Lack. Without complication. One complaint of difficulty with urination last pm; after in/out cath, pt has urinated without problem. Output is good; clear; dark yellow. Passing gas. Site of ablation is clean and dry; nt; no hematoma. Eating and drinking well. No N/V. Pt has been examined and I have discussed with Dr Archer Asa. Will dc to home today. Pt to continue Lovenox inj at home and restart coumadin tomorrow.  He will contact coumadin clinic and MD Monday  for plan for coumadin.  Consults: None  Significant Diagnostic Studies: Renal CT  Treatments: Left renal mass cryoablation  Discharge Exam: Blood pressure 100/56, pulse 71, temperature 98.4 F (36.9 C), temperature source Oral, resp. rate 18, height 6\' 4"  (1.93 m), weight 200 lb (90.719 kg), SpO2 96.00%.  PE:  Heart: RRR +click Lungs: CTA A/O; appropriate Extr FROM; ambulating well Abd: soft; +BS; NT Site of L renal cryo: NT; No bleeding; no hematoma; no sign of inf Urine clear; dark yellow Labs wnl Afeb; VSS  Results for orders placed during the hospital encounter of 02/19/12  TYPE AND SCREEN      Component Value Range   ABO/RH(D) A POS     Antibody Screen NEG     Sample Expiration 02/22/2012    BASIC METABOLIC PANEL      Component Value Range   Sodium 137  135 - 145 mEq/L   Potassium 4.2  3.5 - 5.1 mEq/L   Chloride 101  96 - 112 mEq/L   CO2 29  19 - 32 mEq/L   Glucose, Bld 110 (*) 70 - 99 mg/dL   BUN 12  6 - 23 mg/dL   Creatinine, Ser 4.09  0.50 -  1.35 mg/dL   Calcium 9.1  8.4 - 81.1 mg/dL   GFR calc non Af Amer 84 (*) >90 mL/min   GFR calc Af Amer >90  >90 mL/min  CBC      Component Value Range   WBC 7.7  4.0 - 10.5 K/uL   RBC 4.33  4.22 - 5.81 MIL/uL   Hemoglobin 13.1  13.0 - 17.0 g/dL   HCT 91.4 (*) 78.2 - 95.6 %   MCV 89.6  78.0 - 100.0 fL   MCH 30.3  26.0 - 34.0 pg   MCHC 33.8  30.0 - 36.0 g/dL   RDW 21.3  08.6 - 57.8 %   Platelets 112 (*) 150 - 400 K/uL     Disposition: Left renal mass cryoablation 12/13 Pt tolerated well without complication Urinating well; passed gas Site is clean and dry Pt has been examined and status discussed with Dr Archer Asa Will dc now To follow with coumadin clinic on plan for coumadin Continue all home meds Continue Lovenox bridge and resart coumadin in am Will hear from [place] at clinic for f/u appt with Dr Fredia Sorrow Pt has good understanding of this plan  Discharge Orders    Future Orders Please Complete By Expires   Diet - low sodium heart healthy      Increase activity slowly      Discharge instructions      Comments:   Pt  will hear from [place]- [phone] at clinic for f/u with Dr Fredia Sorrow   Driving Restrictions      Comments:   No driving x 3 days   Lifting restrictions      Comments:   No lifting over 10 lbs x 3 days   Remove dressing in 24 hours      Scheduling Instructions:   May shower tomorrow   Call MD for:  temperature >100.4      Call MD for:  persistant nausea and vomiting      Call MD for:  severe uncontrolled pain      Call MD for:  redness, tenderness, or signs of infection (pain, swelling, redness, odor or green/yellow discharge around incision site)          Medication List     As of 02/20/2012  9:15 AM    TAKE these medications         acetaminophen 500 MG tablet   Commonly known as: TYLENOL   Take 1,000 mg by mouth 2 (two) times daily as needed. As needed for pain      amLODipine 5 MG tablet   Commonly known as: NORVASC   Take 1 tablet (5 mg  total) by mouth daily.      enoxaparin 100 MG/ML injection   Commonly known as: LOVENOX   Inject 1 mL (100 mg total) into the skin every 12 (twelve) hours.      Glucosamine-Chondroitin Tabs   Take 2 tablets by mouth daily.      hydrocortisone cream 1 %   Apply topically. Apply as needed for itching.      metoprolol succinate 25 MG 24 hr tablet   Commonly known as: TOPROL-XL   Take 1 tablet (25 mg total) by mouth daily.      multivitamin capsule   Take 1 capsule by mouth daily.      pravastatin 40 MG tablet   Commonly known as: PRAVACHOL   Take 40 mg by mouth every evening.      warfarin 3 MG tablet   Commonly known as: COUMADIN   TAKE 1 TABLET BY MOUTH DAILY. TAKE AS DIRECTED.         Signed: Jacqueli Pangallo A 02/20/2012, 9:15 AM

## 2012-02-20 NOTE — Discharge Summary (Signed)
Agree with PA note.    Signed,  Heath K. McCullough, MD Vascular & Interventional Radiologist  Radiology  

## 2012-02-20 NOTE — Progress Notes (Signed)
Pt had some difficulty voiding after fc d/c. After many attempts to urinated on his own. Bladder scan done  >500 cc of urine noted in bladder.Paged Md order given for in and out cath. 480cc of amber urine noted. Pt tol  Well and felt relief after in and out cath.Well continue to monitor.

## 2012-02-21 ENCOUNTER — Emergency Department (HOSPITAL_COMMUNITY): Payer: Medicare Other

## 2012-02-21 ENCOUNTER — Inpatient Hospital Stay (HOSPITAL_COMMUNITY)
Admission: EM | Admit: 2012-02-21 | Discharge: 2012-03-03 | DRG: 920 | Disposition: A | Discharge status: Home / Self Care | Payer: Medicare Other | Attending: Family Medicine | Admitting: Family Medicine

## 2012-02-21 ENCOUNTER — Encounter (HOSPITAL_COMMUNITY): Payer: Self-pay | Admitting: *Deleted

## 2012-02-21 DIAGNOSIS — Z954 Presence of other heart-valve replacement: Secondary | ICD-10-CM

## 2012-02-21 DIAGNOSIS — R58 Hemorrhage, not elsewhere classified: Secondary | ICD-10-CM | POA: Diagnosis present

## 2012-02-21 DIAGNOSIS — Y9210 Unspecified residential institution as the place of occurrence of the external cause: Secondary | ICD-10-CM | POA: Diagnosis present

## 2012-02-21 DIAGNOSIS — Y838 Other surgical procedures as the cause of abnormal reaction of the patient, or of later complication, without mention of misadventure at the time of the procedure: Secondary | ICD-10-CM | POA: Diagnosis present

## 2012-02-21 DIAGNOSIS — D500 Iron deficiency anemia secondary to blood loss (chronic): Secondary | ICD-10-CM | POA: Diagnosis present

## 2012-02-21 DIAGNOSIS — I4892 Unspecified atrial flutter: Secondary | ICD-10-CM

## 2012-02-21 DIAGNOSIS — N2889 Other specified disorders of kidney and ureter: Secondary | ICD-10-CM

## 2012-02-21 DIAGNOSIS — Z7901 Long term (current) use of anticoagulants: Secondary | ICD-10-CM

## 2012-02-21 DIAGNOSIS — M1990 Unspecified osteoarthritis, unspecified site: Secondary | ICD-10-CM | POA: Diagnosis present

## 2012-02-21 DIAGNOSIS — K661 Hemoperitoneum: Secondary | ICD-10-CM

## 2012-02-21 DIAGNOSIS — D4112 Neoplasm of uncertain behavior of left renal pelvis: Secondary | ICD-10-CM | POA: Diagnosis present

## 2012-02-21 DIAGNOSIS — IMO0002 Reserved for concepts with insufficient information to code with codable children: Principal | ICD-10-CM | POA: Diagnosis present

## 2012-02-21 DIAGNOSIS — D649 Anemia, unspecified: Secondary | ICD-10-CM | POA: Diagnosis present

## 2012-02-21 DIAGNOSIS — I10 Essential (primary) hypertension: Secondary | ICD-10-CM | POA: Diagnosis present

## 2012-02-21 DIAGNOSIS — I4891 Unspecified atrial fibrillation: Secondary | ICD-10-CM | POA: Diagnosis present

## 2012-02-21 DIAGNOSIS — D3000 Benign neoplasm of unspecified kidney: Secondary | ICD-10-CM | POA: Diagnosis present

## 2012-02-21 DIAGNOSIS — Q8740 Marfan's syndrome, unspecified: Secondary | ICD-10-CM

## 2012-02-21 LAB — CBC WITH DIFFERENTIAL/PLATELET
Basophils Absolute: 0 10*3/uL (ref 0.0–0.1)
Basophils Relative: 0 % (ref 0–1)
Eosinophils Absolute: 0.1 10*3/uL (ref 0.0–0.7)
Eosinophils Relative: 1 % (ref 0–5)
Lymphs Abs: 0.9 10*3/uL (ref 0.7–4.0)
MCH: 29.9 pg (ref 26.0–34.0)
MCHC: 33.9 g/dL (ref 30.0–36.0)
MCV: 88.1 fL (ref 78.0–100.0)
Neutrophils Relative %: 83 % — ABNORMAL HIGH (ref 43–77)
Platelets: 132 10*3/uL — ABNORMAL LOW (ref 150–400)
RBC: 4.38 MIL/uL (ref 4.22–5.81)
RDW: 13.2 % (ref 11.5–15.5)

## 2012-02-21 LAB — COMPREHENSIVE METABOLIC PANEL
ALT: 50 U/L (ref 0–53)
Albumin: 3.7 g/dL (ref 3.5–5.2)
Alkaline Phosphatase: 92 U/L (ref 39–117)
Calcium: 9.5 mg/dL (ref 8.4–10.5)
GFR calc Af Amer: >90 mL/min (ref >90)
Glucose, Bld: 172 mg/dL — ABNORMAL HIGH (ref 70–99)
Potassium: 3.6 mEq/L (ref 3.5–5.1)
Sodium: 133 mEq/L — ABNORMAL LOW (ref 135–145)
Total Protein: 7 g/dL (ref 6.0–8.3)

## 2012-02-21 LAB — URINALYSIS, MICROSCOPIC ONLY
Nitrite: NEGATIVE
Specific Gravity, Urine: 1.021 (ref 1.005–1.030)
Urobilinogen, UA: 1 mg/dL (ref 0.0–1.0)
pH: 6 (ref 5.0–8.0)

## 2012-02-21 LAB — TYPE AND SCREEN: ABO/RH(D): A POS

## 2012-02-21 LAB — POCT I-STAT TROPONIN I: Troponin i, poc: 0.01 ng/mL (ref 0.00–0.08)

## 2012-02-21 LAB — APTT: aPTT: 37 seconds (ref 24–37)

## 2012-02-21 MED ORDER — HYDROMORPHONE HCL PF 1 MG/ML IJ SOLN
1.0000 mg | INTRAMUSCULAR | Status: AC | PRN
  Administered 2012-02-21 (×3): 1 mg via INTRAVENOUS
  Filled 2012-02-21 (×3): qty 1

## 2012-02-21 MED ORDER — DILTIAZEM HCL 25 MG/5ML IV SOLN
10.0000 mg | Freq: Once | INTRAVENOUS | Status: AC
  Administered 2012-02-21: 10 mg via INTRAVENOUS
  Filled 2012-02-21: qty 5

## 2012-02-21 MED ORDER — METOPROLOL SUCCINATE ER 25 MG PO TB24: 25.0000 mg | ORAL_TABLET | Freq: Every morning | ORAL | Status: DC

## 2012-02-21 MED ORDER — IOHEXOL 300 MG/ML  SOLN
100.0000 mL | Freq: Once | INTRAMUSCULAR | Status: AC | PRN
  Administered 2012-02-21: 100 mL via INTRAVENOUS

## 2012-02-21 MED ORDER — ONDANSETRON HCL 4 MG/2ML IJ SOLN
4.0000 mg | Freq: Once | INTRAMUSCULAR | Status: AC | PRN
  Administered 2012-02-21: 4 mg via INTRAVENOUS
  Filled 2012-02-21: qty 2

## 2012-02-21 MED ORDER — METOPROLOL TARTRATE 25 MG PO TABS
25.0000 mg | ORAL_TABLET | Freq: Two times a day (BID) | ORAL | Status: DC
  Filled 2012-02-21 (×2): qty 1

## 2012-02-21 MED ORDER — ACETAMINOPHEN 500 MG PO TABS: 1000.0000 mg | ORAL_TABLET | Freq: Two times a day (BID) | ORAL | Status: DC | PRN

## 2012-02-21 MED ORDER — METOPROLOL SUCCINATE ER 25 MG PO TB24
25.0000 mg | ORAL_TABLET | ORAL | Status: AC
  Administered 2012-02-21: 25 mg via ORAL
  Filled 2012-02-21: qty 1

## 2012-02-21 MED ORDER — DILTIAZEM HCL 100 MG IV SOLR
5.0000 mg/h | Freq: Once | INTRAVENOUS | Status: AC
  Administered 2012-02-21: 5 mg/h via INTRAVENOUS
  Filled 2012-02-21: qty 100

## 2012-02-21 NOTE — ED Notes (Signed)
Patient transported to CT 

## 2012-02-21 NOTE — ED Notes (Signed)
[id number]<AV> Expected date:<BR> Expected time:<BR> Means of arrival:<BR> Comments:<BR> Triage 3

## 2012-02-21 NOTE — ED Provider Notes (Signed)
History    CSN: [id number] Arrival date & time 02/21/12  1422 First MD Initiated Contact with Patient 02/21/12 1539    Chief Complaint  Patient presents with  . Flank Pain  . Post-op Problem   HPI Pt was in the hospital recently for a kidney surgery.  He had needle ablation procedure.  No laparatomy.  Pt was released from the hospital yesterday.  He had a little bit of pain but the pain got very severe this afternoon.  He wasn't doing anything particularly strenuous when the pain started.  The pain is in the left flank, sharp and severe.  This is the same side they did the procedure.  He is having pain in the lower part of his abdomen more focused on the left side now.  No vomiting or diarrhea.  Some dysuria. Pt had stopped his coumadin.  He had been taking lovenox.  He started taking his coumadin again this morning.  Past Medical History  Diagnosis Date  . Marfan's syndrome with aortic dilation   . Hypertension   . Aortic aneurysm 1994    From presumed Marfan's Syndrome.  Followed by Dr Jens Som.  Aneurysm involving the proximal descending thoracic aorta .    Marland Kitchen Pulmonary nodules     Found on chest CT 08/2007.  Monitoring with yearly CT.  3.12mm nodule in R iddle lobe.  Marland Kitchen Hyperlipidemia   . Atrial fibrillation     Followed by Dr Jens Som.  On coumadin.  . Atrial flutter     Followed by Dr Jens Som  . Osteoarthritis   . History of blood transfusion   Thoracic aortic aneurysm with surgical repair, no abdominal aortic aneurysm  Past Surgical History  Procedure Date  . Virtual colonoscopy 11/01/2005    Pt on chronic coumadin for Aortic valve replacement and Atrial fibrilliation therefore at high risk if stopped.  Need to repeat every 5 yrs.   . Transthoracic echocardiogram 05/2009    EF 55-60%, atrium mildly dilated  . Aortic valve replacement 03/1993    St Jude Valve  . Hip fracture surgery 03/1992    s/p R hip Fx  . Vascular surgery   . Cardiac valve replacement   . Kidney surgery      Family History  Problem Relation Age of Onset  . Cancer Brother   . Hypertension Brother   . Aortic aneurysm Maternal Aunt     History  Substance Use Topics  . Smoking status: Never Smoker   . Smokeless tobacco: Never Used  . Alcohol Use: No    Review of Systems  Respiratory: Negative for chest tightness.   Cardiovascular: Negative for chest pain.  Gastrointestinal: Negative for blood in stool.  All other systems reviewed and are negative.   Allergies  Review of patient's allergies indicates no known allergies.  Home Medications   Current Outpatient Rx  Name  Route  Sig  Dispense  Refill  . ACETAMINOPHEN 500 MG PO TABS   Oral   Take 1,000 mg by mouth 2 (two) times daily as needed. As needed for pain         . AMLODIPINE BESYLATE 5 MG PO TABS   Oral   Take 1 tablet (5 mg total) by mouth daily.   30 tablet   12   . ENOXAPARIN SODIUM 100 MG/ML [place] SOLN   Subcutaneous   Inject 1 mL (100 mg total) into the skin every 12 (twelve) hours.   10 mL   1   .  GLUCOSAMINE-CHONDROITIN PO TABS   Oral   Take 2 tablets by mouth daily.          Marland Kitchen HYDROCORTISONE 1 % EX CREA   Topical   Apply topically. Apply as needed for itching.         Marland Kitchen METOPROLOL SUCCINATE ER 25 MG PO TB24   Oral   Take 1 tablet (25 mg total) by mouth daily.   30 tablet   11   . MULTIVITAMINS PO CAPS   Oral   Take 1 capsule by mouth daily.          Marland Kitchen PRAVASTATIN SODIUM 40 MG PO TABS   Oral   Take 40 mg by mouth every evening.         . WARFARIN SODIUM 3 MG PO TABS      TAKE 1 TABLET BY MOUTH DAILY. TAKE AS DIRECTED.   31 tablet   3     BP 114/64  Pulse 140  Temp 98.3 F (36.8 C) (Oral)  Resp 22  SpO2 96%  Physical Exam  Nursing note and vitals reviewed. Constitutional: He appears well-developed and well-nourished. No distress.  HENT:  Head: Normocephalic and atraumatic.  Right Ear: External ear normal.  Left Ear: External ear normal.  Eyes: Conjunctivae normal  are normal. Right eye exhibits no discharge. Left eye exhibits no discharge. No scleral icterus.  Neck: Neck supple. No tracheal deviation present.  Cardiovascular: Normal rate, regular rhythm and intact distal pulses.   Pulmonary/Chest: Effort normal and breath sounds normal. No stridor. No respiratory distress. He has no wheezes. He has no rales.  Abdominal: Soft. Bowel sounds are normal. He exhibits no distension, no fluid wave, no ascites, no pulsatile midline mass and no mass. There is tenderness in the left upper quadrant. There is guarding and CVA tenderness (left). There is no rigidity and no rebound. No hernia.  Musculoskeletal: He exhibits no edema and no tenderness.  Neurological: He is alert. He has normal strength. No sensory deficit. Cranial nerve deficit:  no gross defecits noted. He exhibits normal muscle tone. He displays no seizure activity. Coordination normal.  Skin: Skin is warm and dry. No rash noted.  Psychiatric: He has a normal mood and affect.    ED Course  Procedures (including critical care time) Bladder scan 522 Foley catheter ordered EKG Rate 129 ATRIAL FLUTTER WITH 2:1 AV BLOCK ~ A-rate 220-340, V-rate> 99 INCOMPLETE RBBB AND LAFB ~ axis(240,-40), S>R II III aVF ANTERIOR INFARCT, OLD ~ Q >56mS, abnormal ST-T, V2-V5 Pt has history of a flutter CRITICAL CARE Performed by: Linwood Dibbles R  Total critical care time: 40 Critical care time was exclusive of separately billable procedures and treating other patients. Critical care was necessary to treat or prevent imminent or life-threatening deterioration. Critical care was time spent personally by me on the following activities: development of treatment plan with patient and/or surrogate as well as nursing, discussions with consultants, evaluation of patient's response to treatment, examination of patient, obtaining history from patient or surrogate, ordering and performing treatments and interventions, ordering and  review of laboratory studies, ordering and review of radiographic studies, pulse oximetry and re-evaluation of patient's condition.  7:29 PM CT scan shows retroperitoneal hematoma per Dr Jena Gauss.  I  Discussed the case with interventional radiology.  Would like hospitalist to admit and cardiology consultation to assist in future management of his anticoagulation. 7:45 PM Discussed case with Dr Antoine Poche    Labs Reviewed  CBC WITH DIFFERENTIAL - Abnormal;  Notable for the following:    WBC 16.3 (*)     HCT 38.6 (*)     Platelets 132 (*)     Neutrophils Relative 83 (*)     Neutro Abs 13.5 (*)     Lymphocytes Relative 5 (*)     Monocytes Absolute 1.8 (*)     All other components within normal limits  COMPREHENSIVE METABOLIC PANEL - Abnormal; Notable for the following:    Sodium 133 (*)     Glucose, Bld 172 (*)     AST 64 (*)     All other components within normal limits  URINALYSIS, MICROSCOPIC ONLY - Abnormal; Notable for the following:    APPearance CLOUDY (*)     Hgb urine dipstick LARGE (*)     Ketones, ur 40 (*)     Protein, ur 30 (*)     Leukocytes, UA MODERATE (*)     All other components within normal limits  LIPASE, BLOOD  POCT I-STAT TROPONIN I  PROTIME-INR  APTT  TYPE AND SCREEN   No results found.   1. Retroperitoneal hematoma   2. Atrial flutter with rapid ventricular response       MDM  Patient has a large retroperitoneal hematoma associated with his recent ablation therapy and his use of anticoagulants for his A. flutter and mechanical heart valve. The patient is tachycardic in the emergency room but I believe this is more related to his A. flutter than hemodynamic instability at this time. His blood pressure is in fact hypertensive. Patient has a type and screen and the lab.   At this time his INR is normal. He did take his Lovenox today. I will hold off on any reversal agents at this time. I've spoken with interventional radiology. I will ask the hospitalist  service to admit as requested and I will consult with his cardiologist to assist in anticoagulant management. The patient has been started on a Cardizem drip to stabilize his tachycardia.        Celene Kras, MD 02/21/12 [phone]

## 2012-02-21 NOTE — Consult Note (Signed)
CARDIOLOGY CONSULT NOTE  Patient ID: Juan Wilkerson MRN: [id number] DOB/AGE: [birth year] 71 y.o.  Admit date: 02/21/2012 Primary Physician Demetria Pore, MD Primary Cardiologist Dr. Jens Som Chief Complaint  Atrial flutter  HPI:   The patient presented with left flank pain following a percutaneous cryoablation of a left renal mass by Dr. Fredia Sorrow yesterday.  He is found to have any retroperitoneal hematoma. He also is found to have atrial flutter. He does have a history of Marfan's syndrome, atrial fibrillation/flutter and St. Jude's mechanical aortic valve replacement.  He has been on chronic Coumadin therapy and was bridged with Lovenox while coming off of warfarin for his procedure. He has had bridging Lovenox Saturday and this am.  He did resume warfarin this morning.    In the emergency room he was hypertensive.  His hemoglobin was 13.1.  INR was 1.13.  EKG demonstrates a atrial flutter with predominantly 21 conduction at a ventricular rate of about 130 . He was started on Cardizem.  My review of office records it appears that he is persistently in fibrillation/flutter.  He usually does not feel his heart beat. He does not report any acute cardiovascular symptoms.  The patient denies any new symptoms such as chest discomfort, neck or arm discomfort. There has been no new shortness of breath, PND or orthopnea. There have been no reported episodes of presyncope or syncope.   Past Medical History  Diagnosis Date  . Marfan's syndrome with aortic dilation   . Hypertension   . Aortic aneurysm 1994    From presumed Marfan's Syndrome.  Followed by Dr Jens Som.  Aneurysm involving the proximal descending thoracic aorta .    Marland Kitchen Pulmonary nodules     Found on chest CT 08/2007.  Monitoring with yearly CT.  3.39mm nodule in R iddle lobe.  Marland Kitchen Hyperlipidemia   . Atrial fibrillation     Followed by Dr Jens Som.  On coumadin.  . Atrial flutter     Followed by Dr Jens Som  . Osteoarthritis   .  History of blood transfusion     Past Surgical History  Procedure Date  . Virtual colonoscopy 11/01/2005    Pt on chronic coumadin for Aortic valve replacement and Atrial fibrilliation therefore at high risk if stopped.  Need to repeat every 5 yrs.   . Transthoracic echocardiogram 05/2009    EF 55-60%, atrium mildly dilated  . Aortic valve replacement 03/1993    St Jude Valve  . Hip fracture surgery 03/1992    s/p R hip Fx  . Vascular surgery   . Cardiac valve replacement   . Kidney surgery     No Known Allergies  (Not in a hospital admission) Family History  Problem Relation Age of Onset  . Cancer Brother   . Hypertension Brother   . Aortic aneurysm Maternal Aunt     History   Social History  . Marital Status: Divorced    Spouse Name: N/A    Number of Children: N/A  . Years of Education: College   Occupational History  .     Social History Main Topics  . Smoking status: Never Smoker   . Smokeless tobacco: Never Used  . Alcohol Use: No  . Drug Use: No  . Sexually Active: No   Other Topics Concern  . Not on file   Social History Narrative   Divorced, lives alone. Has a daughter (pt does not have contact with her, has not seen her since she was 3) . Studied  music in college.Plays oboe with [employer]. 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 No alcohol use. No recreational drugs.     ROS:  As stated in the HPI and negative for all other systems.  Physical Exam: Blood pressure 147/122, pulse 136, temperature 98.3 F (36.8 C), temperature source Oral, resp. rate 22, SpO2 96.00%.  GENERAL:  Well appearing HEENT:  Pupils equal round and reactive, fundi not visualized, oral mucosa unremarkable NECK:  No jugular venous distention, waveform within normal limits, carotid upstroke brisk and symmetric, no bruits, no thyromegaly LYMPHATICS:  No cervical, inguinal adenopathy LUNGS:  Clear to auscultation bilaterally BACK:  No  CVA tenderness CHEST:  Unremarkable HEART:  PMI not displaced or sustained,S1 within normal limits, mechanical S2, no S3,  no clicks, no rubs, no murmurs, irregular ABD:  Flat, positive bowel sounds normal in frequency in pitch, no bruits, no rebound, no guarding, no midline pulsatile mass, no hepatomegaly, no splenomegaly, abdomen is distended and he has mild discomfort to mild palpation.  EXT:  2 plus pulses throughout, no edema, no cyanosis no clubbing SKIN:  No rashes no nodules, chronic venous stasis changes.  NEURO:  Cranial nerves II through XII grossly intact, motor grossly intact throughout PSYCH:  Cognitively intact, oriented to person place and time   Labs: Lab Results  Component Value Date   BUN 14 02/21/2012   Lab Results  Component Value Date   CREATININE 0.72 02/21/2012   Lab Results  Component Value Date   NA 133* 02/21/2012   K 3.6 02/21/2012   CL 97 02/21/2012   CO2 24 02/21/2012    Lab Results  Component Value Date   WBC 16.3* 02/21/2012   HGB 13.1 02/21/2012   HCT 38.6* 02/21/2012   MCV 88.1 02/21/2012   PLT 132* 02/21/2012    Lab Results  Component Value Date   ALT 50 02/21/2012   AST 64* 02/21/2012   ALKPHOS 92 02/21/2012   BILITOT 0.9 02/21/2012     ZOX:WRUEAV flutter with predominant rate 129. Right bundle branch block with left anterior fascicular block, no significant change from previous. 02/21/2012  ASSESSMENT AND PLAN:   Aortic valve replacement: Certainly the patient is at increased risk for thromboembolism given his mechanical aortic valve and his ongoing fibrillation/flutter. Unfortunately he cannot be anticoagulated at this point. We're going to have to discuss with radiology the risks benefits in the days to come increased retroperitoneal bleeding versus thromboembolic cardiac risk.  This evening and this soon after the acute bleed the risk of increased bleeding certainly outweighs the short-term risk of thromboembolism so we will  hold warfarin and other systemic anticoagulation.  Atrial flutter/fibrillation: The patient can continue his on a beta blocker and this can be titrated. For acute control this evening he will continue with IV Cardizem. This can also be converted to by mouth for additional rate control. He is hemodynamically stable.   SignedShedrick Sarli 02/21/2012, 8:14 PM

## 2012-02-21 NOTE — ED Notes (Signed)
Per Allen in pharmacy, don't have to dilute and push over two mins.

## 2012-02-21 NOTE — ED Notes (Signed)
Pt HR in range window for Cardizem drip; pt kept at 5mg /hr.

## 2012-02-21 NOTE — ED Notes (Signed)
Pt reports recent surgery "kidney ablation" for mass. Report released from hospital on yesterday. Pt c/o severe left flank pain starting this afternoon.

## 2012-02-21 NOTE — ED Notes (Signed)
Bladder scan noted 572 ml of fluid.

## 2012-02-21 NOTE — ED Notes (Signed)
MD at bedside. 

## 2012-02-21 NOTE — ED Notes (Addendum)
Cardiologist request Cardizem at 10 mg/hr.

## 2012-02-22 ENCOUNTER — Encounter (HOSPITAL_COMMUNITY): Payer: Self-pay | Admitting: *Deleted

## 2012-02-22 DIAGNOSIS — I4891 Unspecified atrial fibrillation: Secondary | ICD-10-CM

## 2012-02-22 DIAGNOSIS — D62 Acute posthemorrhagic anemia: Secondary | ICD-10-CM

## 2012-02-22 DIAGNOSIS — R58 Hemorrhage, not elsewhere classified: Secondary | ICD-10-CM

## 2012-02-22 LAB — GLUCOSE, CAPILLARY: Glucose-Capillary: 205 mg/dL — ABNORMAL HIGH (ref 70–99)

## 2012-02-22 LAB — CBC
HCT: 26.9 % — ABNORMAL LOW (ref 39.0–52.0)
MCH: 30.8 pg (ref 26.0–34.0)
MCH: 30.4 pg (ref 26.0–34.0)
MCHC: 34.3 g/dL (ref 30.0–36.0)
MCV: 89.5 fL (ref 78.0–100.0)
Platelets: 120 10*3/uL — ABNORMAL LOW (ref 150–400)
Platelets: 135 10*3/uL — ABNORMAL LOW (ref 150–400)
Platelets: 125 10*3/uL — ABNORMAL LOW (ref 150–400)
RBC: 3.42 MIL/uL — ABNORMAL LOW (ref 4.22–5.81)
RDW: 13.5 % (ref 11.5–15.5)
RDW: 13.5 % (ref 11.5–15.5)
RDW: 13.7 % (ref 11.5–15.5)
RDW: 13.8 % (ref 11.5–15.5)
WBC: 11.4 10*3/uL — ABNORMAL HIGH (ref 4.0–10.5)
WBC: 12.4 10*3/uL — ABNORMAL HIGH (ref 4.0–10.5)

## 2012-02-22 LAB — BASIC METABOLIC PANEL
CO2: 26 mEq/L (ref 19–32)
Calcium: 8.8 mg/dL (ref 8.4–10.5)
GFR calc Af Amer: >90 mL/min (ref >90)
Sodium: 139 mEq/L (ref 135–145)

## 2012-02-22 MED ORDER — ACETAMINOPHEN 650 MG RE SUPP: 650.0000 mg | Freq: Four times a day (QID) | RECTAL | Status: DC | PRN

## 2012-02-22 MED ORDER — HYDROMORPHONE HCL PF 1 MG/ML IJ SOLN
0.5000 mg | INTRAMUSCULAR | Status: DC | PRN
  Administered 2012-02-22 – 2012-02-29 (×7): 1 mg via INTRAVENOUS
  Filled 2012-02-22 (×7): qty 1

## 2012-02-22 MED ORDER — ONDANSETRON HCL 4 MG/2ML IJ SOLN: 4.0000 mg | Freq: Four times a day (QID) | INTRAMUSCULAR | Status: DC | PRN

## 2012-02-22 MED ORDER — DILTIAZEM HCL 100 MG IV SOLR
5.0000 mg/h | INTRAVENOUS | Status: DC
  Filled 2012-02-22: qty 100

## 2012-02-22 MED ORDER — ONDANSETRON HCL 4 MG PO TABS: 4.0000 mg | ORAL_TABLET | Freq: Four times a day (QID) | ORAL | Status: DC | PRN

## 2012-02-22 MED ORDER — SODIUM CHLORIDE 0.9 % IJ SOLN
3.0000 mL | Freq: Two times a day (BID) | INTRAMUSCULAR | Status: DC
  Administered 2012-02-22 – 2012-03-03 (×10): 3 mL via INTRAVENOUS

## 2012-02-22 MED ORDER — ACETAMINOPHEN 325 MG PO TABS: 650.0000 mg | ORAL_TABLET | Freq: Four times a day (QID) | ORAL | Status: DC | PRN

## 2012-02-22 MED ORDER — SODIUM CHLORIDE 0.9 % IV SOLN
INTRAVENOUS | Status: DC
  Administered 2012-02-22: 100 mL/h via INTRAVENOUS
  Administered 2012-02-22 – 2012-02-24 (×5): via INTRAVENOUS
  Administered 2012-02-26 – 2012-02-27 (×2): 1000 mL via INTRAVENOUS

## 2012-02-22 MED ORDER — ALUM & MAG HYDROXIDE-SIMETH 200-200-20 MG/5ML PO SUSP
30.0000 mL | Freq: Four times a day (QID) | ORAL | Status: DC | PRN
  Administered 2012-02-22: 30 mL via ORAL
  Filled 2012-02-22 (×2): qty 30

## 2012-02-22 NOTE — H&P (Signed)
Family Medicine Teaching Service Attending Note  I interviewed and examined patient Juan Wilkerson and reviewed their tests and x-rays.  I discussed with Dr. Ashley Royalty and reviewed their note for today.  I agree with their assessment and plan.     Additionally  This AM feels less pain except when breathes deeply  Challenging situation.   Appreciated cardiology assistance.  Hold anticoagulation for now.  Consider urology consult for suggestions if continues to bleed Monitor pain and Hgb closely

## 2012-02-22 NOTE — Progress Notes (Signed)
Patient ID: Juan Wilkerson, male   DOB: 11-21-1940, 71 y.o.   MRN: [id number]    SUBJECTIVE: Still some pain in his flank.  Hemoglobin has dropped 2.2 points.  HR under control on diltiazem gtt.      . sodium chloride  3 mL Intravenous Q12H  diltiazem gtt    Filed Vitals:   02/22/12 0400 02/22/12 0500 02/22/12 0600 02/22/12 0700  BP: 107/66 114/63 108/57 110/61  Pulse: 65 65 65 66  Temp: 97.9 F (36.6 C)     TempSrc: Oral     Resp: 19 20 21 20   Height:      Weight:      SpO2: 97% 98% 97% 98%    Intake/Output Summary (Last 24 hours) at 02/22/12 0801 Last data filed at 02/22/12 0600  Gross per 24 hour  Intake    760 ml  Output   1600 ml  Net   -840 ml    LABS: Basic Metabolic Panel:  Basename 02/21/12 1520 02/20/12 0415  NA 133* 137  K 3.6 4.2  CL 97 101  CO2 24 29  GLUCOSE 172* 110*  BUN 14 12  CREATININE 0.72 0.88  CALCIUM 9.5 9.1  MG -- --  PHOS -- --   Liver Function Tests:  Basename 02/21/12 1520  AST 64*  ALT 50  ALKPHOS 92  BILITOT 0.9  PROT 7.0  ALBUMIN 3.7    Basename 02/21/12 1520  LIPASE 17  AMYLASE --   CBC:  Basename 02/22/12 0233 02/22/12 0041 02/21/12 1520  WBC 14.0* 13.2* --  NEUTROABS -- -- 13.5*  HGB 10.9* 10.8* --  HCT 31.8* 31.4* --  MCV 89.8 89.5 --  PLT 121* 120* --   Cardiac Enzymes: No results found for this basename: CKTOTAL:3,CKMB:3,CKMBINDEX:3,TROPONINI:3 in the last 72 hours BNP: No components found with this basename: POCBNP:3 D-Dimer: No results found for this basename: DDIMER:2 in the last 72 hours Hemoglobin A1C: No results found for this basename: HGBA1C in the last 72 hours Fasting Lipid Panel: No results found for this basename: CHOL,HDL,LDLCALC,TRIG,CHOLHDL,LDLDIRECT in the last 72 hours Thyroid Function Tests: No results found for this basename: TSH,T4TOTAL,FREET3,T3FREE,THYROIDAB in the last 72 hours Anemia Panel: No results found for this basename: VITAMINB12,FOLATE,FERRITIN,TIBC,IRON,RETICCTPCT in  the last 72 hours  RADIOLOGY: Ct Abdomen Pelvis W Contrast  02/21/2012  *RADIOLOGY REPORT*  Clinical Data: Flank pain.  Recent left renal mass ablation.  CT ABDOMEN AND PELVIS WITH CONTRAST  Technique:  Multidetector CT imaging of the abdomen and pelvis was performed following the standard protocol during bolus administration of intravenous contrast.  Contrast: OMNIPAQUE IOHEXOL 300 MG/ML  SOLN  Comparison: CT images dated 12/13 and 11/20/2011  Findings: The patient has a large perirenal and retroperitoneal hemorrhage.  The left kidney is superiorly and anteriorly displaced by the hemorrhage.  There is no obstruction of the kidney.  The left upper pole renal mass that was ablated is visible.  There is also blood extending across the midline adjacent to the duodenum and into the right pericolic gutter.  The hematoma extends into the left side of the pelvis and slightly compresses the left side of the bladder.  There is also a small amount of blood in the  left upper quadrant around the spleen.  Foley catheter in the bladder.  Delayed imaging demonstrates normal excretion of contrast from both kidneys.  Incidental note is made of multiple small cysts in the liver as well as small cysts in both kidneys.  Pancreas  and spleen and adrenal glands are normal.  The bowel is normal except for being displaced by the large hematoma.  No acute osseous abnormality.  IMPRESSION:    Large retroperitoneal hemorrhage originating from the upper pole of the left kidney at the site of tumor ablation.  Secondary mass effect upon the kidney and adjacent bowel and bladder.  Critical Value/emergent results were called by telephone at the time of interpretation on 02/21/2012 at  7:21 p.m. to  Dr. Linwood Dibbles and to Dr. Malachy Moan,, who verbally acknowledged these results.   Original Report Authenticated By: Francene Boyers, M.D.    Ct Guide Tissue Ablation  02/19/2012  *RADIOLOGY REPORT*  Clinical Data:  2.5 cm posterior  left renal mass.  The patient presents for biopsy and cryoablation.  CT-GUIDED CORE BIOPSY OF LEFT RENAL MASS. CT-GUIDED PERCUTANEOUS CRYOABLATION OF LEFT RENAL MASS.  Anesthesia:  General  Medications:  2 grams IV Ancef. As antibiotic prophylaxis, Ancef was ordered pre-procedure and administered intravenously within one hour of incision.  Procedure:  The procedure, risks, benefits, and alternatives were explained to the patient.  Questions regarding the procedure were encouraged and answered.  The patient understands and consents to the procedure.  The patient was placed under general anesthesia.  Initial unenhanced CT was performed in a prone position to localize the left renal mass.  The left flank region was prepped with Betadine in a sterile fashion, and a sterile drape was applied covering the operative field.  A sterile gown and sterile gloves were used for the procedure.  A 17 gauge trocar needle was advanced into the left renal mass. Core biopsy was performed with an 18 gauge automated core biopsy device.  A total of two core samples were submitted in formalin for pathologic analysis.  Under CT guidance, a total of three Galil Ice Rod Plus percutaneous cryoablation probes were advanced into the left renal mass.  Probe positioning was confirmed by CT prior to cryoablation. Prior to ablation, a 22 gauge spinal needle was advanced between the kidney and the spleen and a total of approximately 90 ml of sterile saline injected for hydrodissection.  Cryoablation was performed through the three probes simultaneously. Initial 10 minute cycle of cryoablation was performed.  This was followed by a 8 minute thaw cycle.  A second 10 minute cycle of cryoablation was then performed.  During ablation, periodic CT imaging was performed to monitor ice ball formation and morphology. After active thaw, the cryoablation probes were removed.  Post-procedural CT was performed.  Complications: None  Findings:  The exophytic mass  emanating from the posterior aspect of the upper to mid kidney was well localized by unenhanced CT. Due to proximity of the spleen, hydrodissection was performed prior to treatment.  Biopsy was performed of the lesion prior to cryoablation.  Imaging during treatment shows adequate ice ball formation encompassing the lesion.  There were no immediate bleeding complications.  IMPRESSION:  CT guided percutaneous core biopsy and cryoablation of a left renal mass.  The patient will be observed overnight.  Initial follow-up will be performed in approximately 4 weeks.   Original Report Authenticated By: Irish Lack, M.D.    Ct Biopsy  02/19/2012  *RADIOLOGY REPORT*  Clinical Data:  2.5 cm posterior left renal mass.  The patient presents for biopsy and cryoablation.  CT-GUIDED CORE BIOPSY OF LEFT RENAL MASS. CT-GUIDED PERCUTANEOUS CRYOABLATION OF LEFT RENAL MASS.  Anesthesia:  General  Medications:  2 grams IV Ancef. As antibiotic prophylaxis, Ancef was  ordered pre-procedure and administered intravenously within one hour of incision.  Procedure:  The procedure, risks, benefits, and alternatives were explained to the patient.  Questions regarding the procedure were encouraged and answered.  The patient understands and consents to the procedure.  The patient was placed under general anesthesia.  Initial unenhanced CT was performed in a prone position to localize the left renal mass.  The left flank region was prepped with Betadine in a sterile fashion, and a sterile drape was applied covering the operative field.  A sterile gown and sterile gloves were used for the procedure.  A 17 gauge trocar needle was advanced into the left renal mass. Core biopsy was performed with an 18 gauge automated core biopsy device.  A total of two core samples were submitted in formalin for pathologic analysis.  Under CT guidance, a total of three Galil Ice Rod Plus percutaneous cryoablation probes were advanced into the left renal mass.   Probe positioning was confirmed by CT prior to cryoablation. Prior to ablation, a 22 gauge spinal needle was advanced between the kidney and the spleen and a total of approximately 90 ml of sterile saline injected for hydrodissection.  Cryoablation was performed through the three probes simultaneously. Initial 10 minute cycle of cryoablation was performed.  This was followed by a 8 minute thaw cycle.  A second 10 minute cycle of cryoablation was then performed.  During ablation, periodic CT imaging was performed to monitor ice ball formation and morphology. After active thaw, the cryoablation probes were removed.  Post-procedural CT was performed.  Complications: None  Findings:  The exophytic mass emanating from the posterior aspect of the upper to mid kidney was well localized by unenhanced CT. Due to proximity of the spleen, hydrodissection was performed prior to treatment.  Biopsy was performed of the lesion prior to cryoablation.  Imaging during treatment shows adequate ice ball formation encompassing the lesion.  There were no immediate bleeding complications.  IMPRESSION:  CT guided percutaneous core biopsy and cryoablation of a left renal mass.  The patient will be observed overnight.  Initial follow-up will be performed in approximately 4 weeks.   Original Report Authenticated By: Irish Lack, M.D.     PHYSICAL EXAM General: NAD Neck: No JVD, no thyromegaly or thyroid nodule.  Lungs: Clear to auscultation bilaterally with normal respiratory effort. CV: Nondisplaced PMI.  Heart irregular S1/S2, no S3/S4, mechanical S2, no murmur.  No peripheral edema.  No carotid bruit.  Normal pedal pulses.  Abdomen: Soft, nontender, no hepatosplenomegaly, no distention.  Neurologic: Alert and oriented x 3.  Psych: Normal affect. Extremities: No clubbing or cyanosis.   TELEMETRY: Reviewed telemetry pt in atrial flutter, HR variable in the 60s-90s  ASSESSMENT AND PLAN:  71 yo with history of Marfans,  chronic atrial flutter/fibrillation and mechanical aortic valve presented with retroperitoneal bleed after renal cryoablation procedure (had bridging Lovenox).   1. Atrial flutter: HR now controlled on diltiazem, continue.  Can transition to his home po rate control regimen when he is not NPO.  2. Mechanical aortic valve:  Needs anticoagulation but has had retroperitoneal bleed.  Need IR to review films and comment on risk of progression.  Hemoglobin has dropped by 2.2 since he has been in the hospital.  Would resume coumadin without bridging Lovenox when he has stabilized.   Marca Ancona 02/22/2012 8:06 AM

## 2012-02-22 NOTE — Progress Notes (Signed)
Agree 

## 2012-02-22 NOTE — H&P (Signed)
Juan Wilkerson is an 71 y.o. male.   Assessment/Plan 71 yo male with history of A. Fib, Marfan syndrome, HTN, and 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  1. Retroperitoneal bleed:  Was on coumadin for a.fib but transitioned to lovenox for recent ablation procedure.  Had procedure on 12/13 and now found to have retroperitoneal bleed on CT.  Initial Hgb in ED stable, will continue to check q2 hour cbc's throughout the night to be sure that this remains stable.  He has been typed and screened.  Right now exam with some pain to palpation, but abdomen soft. Will opt for conservative treatment for now given stable vitals. Will monitor closely for signs of abdominal compartment syndrome. IR contacted by ED physician, will await further recommendations from them as well.  Hold all anticoagulation at this time.    2. Atrial fibrillation:  Currently in a. Fib with RVR.  Placed on diltiazem drip in ED.  Will continue this for now, with transition to PO in the am.   Continue metoprolol.  Cardiology consult done in ED, appreciate input.  Given mechanical valve as well as a. Fib will likely need to be on some form of anticoagulation.  Will look to cardiology for further guidance  3. HTN:  BP on low side currently, likely 2/2 to diltiazem drip.  Continue metoprolol for rate control.  Will hold amlodipine for now.   4. Renal mass:  S/p core biopsy and cryo ablation on 12/13.  ?RCC, will await pathology results.   5. FEN/GI:  NS @ 131mL/hr, Ice chips only for now  6. PPx: SCD's   7. Dispo: Pending further work up and improvement     Chief Complaint: Abdominal pain  HPI: 71 yo male with history of A. Fib, Marfan syndrome, HTN, and Renal neoplasm s/p biopsy with cryo ablation on 02/19/12 here with complaint of abdominal pain.  States that he had a renal biopsy with ablation on 12/13 and developed mild abdominal pain afterwards, but got much worse around 1230  this afternoon.  Pain is located in L side of abdomen and flank region.  He was on coumadin for a mechanical valve and a. Fib, however was transitioned to lovenox in anticipation of his procedure.  He has not seen blood in his urine or stool.  He denies shortness of breath, chest pain, nausea, vomiting or decreased urine output.    Past Medical History  Diagnosis Date  . Marfan's syndrome with aortic dilation   . Hypertension   . Aortic aneurysm 1994    From presumed Marfan's Syndrome.  Followed by Dr Jens Som.  Aneurysm involving the proximal descending thoracic aorta .    Marland Kitchen Pulmonary nodules     Found on chest CT 08/2007.  Monitoring with yearly CT.  3.73mm nodule in R iddle lobe.  Marland Kitchen Hyperlipidemia   . Atrial fibrillation     Followed by Dr Jens Som.  On coumadin.  . Atrial flutter     Followed by Dr Jens Som  . Osteoarthritis   . History of blood transfusion     Past Surgical History  Procedure Date  . Virtual colonoscopy 11/01/2005    Pt on chronic coumadin for Aortic valve replacement and Atrial fibrilliation therefore at high risk if stopped.  Need to repeat every 5 yrs.   . Aortic valve replacement 03/1993    St Jude Valve  . Hip fracture surgery 03/1992    s/p R hip  Fx  . Vascular surgery   . Cardiac valve replacement   . Kidney surgery   . Coronary artery bypass graft     Family History  Problem Relation Age of Onset  . Cancer Brother   . Hypertension Brother   . Aortic aneurysm Maternal Aunt    Social History:  reports that he has never smoked. He has never used smokeless tobacco. He reports that he does not drink alcohol or use illicit drugs.  Allergies: No Known Allergies  Medications Prior to Admission  Medication Sig Dispense Refill  . acetaminophen (TYLENOL) 500 MG tablet Take 1,000 mg by mouth 2 (two) times daily as needed. As needed for pain      . amLODipine (NORVASC) 5 MG tablet Take 5 mg by mouth every morning.      . enoxaparin (LOVENOX) 100 MG/ML  injection Inject 100 mg into the skin every 12 (twelve) hours.      . Glucosamine-Chondroit-Vit C-Mn (GLUCOSAMINE-CHONDROITIN) TABS Take 2 tablets by mouth every morning.       . hydrocortisone 1 % cream Apply topically. Apply as needed for itching.      . metoprolol succinate (TOPROL-XL) 25 MG 24 hr tablet Take 25 mg by mouth every morning.      . Multiple Vitamin (MULTIVITAMIN) capsule Take 1 capsule by mouth every morning.       . pravastatin (PRAVACHOL) 40 MG tablet Take 40 mg by mouth every evening.      . warfarin (COUMADIN) 3 MG tablet Take 3 mg by mouth every morning.        Results for orders placed during the hospital encounter of 02/21/12 (from the past 48 hour(s))  CBC WITH DIFFERENTIAL     Status: Abnormal   Collection Time   02/21/12  3:20 PM      Component Value Range Comment   WBC 16.3 (*) 4.0 - 10.5 K/uL    RBC 4.38  4.22 - 5.81 MIL/uL    Hemoglobin 13.1  13.0 - 17.0 g/dL    HCT 16.1 (*) 09.6 - 52.0 %    MCV 88.1  78.0 - 100.0 fL    MCH 29.9  26.0 - 34.0 pg    MCHC 33.9  30.0 - 36.0 g/dL    RDW 04.5  40.9 - 81.1 %    Platelets 132 (*) 150 - 400 K/uL    Neutrophils Relative 83 (*) 43 - 77 %    Neutro Abs 13.5 (*) 1.7 - 7.7 K/uL    Lymphocytes Relative 5 (*) 12 - 46 %    Lymphs Abs 0.9  0.7 - 4.0 K/uL    Monocytes Relative 11  3 - 12 %    Monocytes Absolute 1.8 (*) 0.1 - 1.0 K/uL    Eosinophils Relative 1  0 - 5 %    Eosinophils Absolute 0.1  0.0 - 0.7 K/uL    Basophils Relative 0  0 - 1 %    Basophils Absolute 0.0  0.0 - 0.1 K/uL   COMPREHENSIVE METABOLIC PANEL     Status: Abnormal   Collection Time   02/21/12  3:20 PM      Component Value Range Comment   Sodium 133 (*) 135 - 145 mEq/L    Potassium 3.6  3.5 - 5.1 mEq/L    Chloride 97  96 - 112 mEq/L    CO2 24  19 - 32 mEq/L    Glucose, Bld 172 (*) 70 - 99 mg/dL  BUN 14  6 - 23 mg/dL    Creatinine, Ser 1.61  0.50 - 1.35 mg/dL    Calcium 9.5  8.4 - 09.6 mg/dL    Total Protein 7.0  6.0 - 8.3 g/dL     Albumin 3.7  3.5 - 5.2 g/dL    AST 64 (*) 0 - 37 U/L    ALT 50  0 - 53 U/L    Alkaline Phosphatase 92  39 - 117 U/L    Total Bilirubin 0.9  0.3 - 1.2 mg/dL    GFR calc non Af Amer >90  >90 mL/min    GFR calc Af Amer >90  >90 mL/min   LIPASE, BLOOD     Status: Normal   Collection Time   02/21/12  3:20 PM      Component Value Range Comment   Lipase 17  11 - 59 U/L   POCT I-STAT TROPONIN I     Status: Normal   Collection Time   02/21/12  3:37 PM      Component Value Range Comment   Troponin i, poc 0.01  0.00 - 0.08 ng/mL    Comment 3            URINALYSIS, MICROSCOPIC ONLY     Status: Abnormal   Collection Time   02/21/12  3:44 PM      Component Value Range Comment   Color, Urine YELLOW  YELLOW    APPearance CLOUDY (*) CLEAR    Specific Gravity, Urine 1.021  1.005 - 1.030    pH 6.0  5.0 - 8.0    Glucose, UA NEGATIVE  NEGATIVE mg/dL    Hgb urine dipstick LARGE (*) NEGATIVE    Bilirubin Urine NEGATIVE  NEGATIVE    Ketones, ur 40 (*) NEGATIVE mg/dL    Protein, ur 30 (*) NEGATIVE mg/dL    Urobilinogen, UA 1.0  0.0 - 1.0 mg/dL    Nitrite NEGATIVE  NEGATIVE    Leukocytes, UA MODERATE (*) NEGATIVE    WBC, UA 3-6  <3 WBC/hpf    RBC / HPF 7-10  <3 RBC/hpf    Bacteria, UA RARE  RARE    Urine-Other MUCOUS PRESENT     PROTIME-INR     Status: Normal   Collection Time   02/21/12  4:13 PM      Component Value Range Comment   Prothrombin Time 14.3  11.6 - 15.2 seconds    INR 1.13  0.00 - 1.49   APTT     Status: Normal   Collection Time   02/21/12  4:13 PM      Component Value Range Comment   aPTT 37  24 - 37 seconds   TYPE AND SCREEN     Status: Normal   Collection Time   02/21/12  4:13 PM      Component Value Range Comment   ABO/RH(D) A POS      Antibody Screen NEG      Sample Expiration 02/24/2012     MRSA PCR SCREENING     Status: Normal   Collection Time   02/21/12  9:49 PM      Component Value Range Comment   MRSA by PCR NEGATIVE  NEGATIVE    Ct Abdomen Pelvis W  Contrast  02/21/2012  *RADIOLOGY REPORT*  Clinical Data: Flank pain.  Recent left renal mass ablation.  CT ABDOMEN AND PELVIS WITH CONTRAST  Technique:  Multidetector CT imaging of the abdomen and pelvis was performed following the  standard protocol during bolus administration of intravenous contrast.  Contrast: OMNIPAQUE IOHEXOL 300 MG/ML  SOLN  Comparison: CT images dated 12/13 and 11/20/2011  Findings: The patient has a large perirenal and retroperitoneal hemorrhage.  The left kidney is superiorly and anteriorly displaced by the hemorrhage.  There is no obstruction of the kidney.  The left upper pole renal mass that was ablated is visible.  There is also blood extending across the midline adjacent to the duodenum and into the right pericolic gutter.  The hematoma extends into the left side of the pelvis and slightly compresses the left side of the bladder.  There is also a small amount of blood in the  left upper quadrant around the spleen.  Foley catheter in the bladder.  Delayed imaging demonstrates normal excretion of contrast from both kidneys.  Incidental note is made of multiple small cysts in the liver as well as small cysts in both kidneys.  Pancreas and spleen and adrenal glands are normal.  The bowel is normal except for being displaced by the large hematoma.  No acute osseous abnormality.  IMPRESSION:    Large retroperitoneal hemorrhage originating from the upper pole of the left kidney at the site of tumor ablation.  Secondary mass effect upon the kidney and adjacent bowel and bladder.  Critical Value/emergent results were called by telephone at the time of interpretation on 02/21/2012 at  7:21 p.m. to  Dr. Linwood Dibbles and to Dr. Malachy Moan,, who verbally acknowledged these results.   Original Report Authenticated By: Francene Boyers, M.D.     ROS Per HPI, otherwise negative. Blood pressure 109/61, pulse 64, temperature 98.4 F (36.9 C), temperature source Oral, resp. rate 26, height  6\' 2"  (1.88 m), weight 216 lb 7.9 oz (98.2 kg), SpO2 92.00%. Physical Exam  Constitutional: He is oriented to person, place, and time. He appears well-nourished. No distress.  HENT:  Head: Normocephalic and atraumatic.  Eyes: Conjunctivae normal are normal.  Cardiovascular:       Irregularly, irregular.  Click from mechanical valve present.    Respiratory: Effort normal and breath sounds normal. No respiratory distress. He has no wheezes.  GI: Soft. Bowel sounds are normal. He exhibits no distension (mild distention). There is tenderness. There is no rebound and no guarding.  Musculoskeletal: He exhibits no edema.  Neurological: He is alert and oriented to person, place, and time. Abnormal coordination: Hemosiderin deposition/stasis changes bilaterally.   Skin:       Large cyst on back, non tender.  Multiple bruises on abdomen at previous lovenox injection sites.         Juan Wilkerson 02/22/2012, 12:36 AM

## 2012-02-22 NOTE — Progress Notes (Signed)
Patient ID: Juan Wilkerson, male   DOB: 21-Mar-1940, 71 y.o.   MRN: [id number] S: Patient reports some dizziness with ambulation to bed side chair today. He also dose not feel like he moves his bowels well and would like something for it. He reports his bowel movements are "hard and dry." Patient is NPO awaiting IR evaluation for retro perineal bleed for hi cryoablation of Renal tumor on Friday by their team.  O: BP 114/58  Pulse 68  Temp 98.1 F (36.7 C) (Oral)  Resp 19  Ht 6\' 2"  (1.88 m)  Wt 216 lb 7.9 oz (98.2 kg)  BMI 27.80 kg/m2  SpO2 95% Gen:  Pleasant gentleman. Appeared tired and pale CV: Bradycardic at time of exam. Irregular. No murmur appreciated. Lungs: CTAB ABD: Soft. Diffusely, but very mildly tender. Mildly distended. BS+ EXT: NT. No erythema or edema.  A/P: - Retro-perineal bleed: Awaiting on IR to re-evaluate imaging studies and patient, to guide our decision process and his risk of progression. Consulted in ED and paged this afternoon, they are aware of the patient and problem and will write a note when they have seen him.  - Atrial flutter: HR now controlled on diltiazem, will continue until he can be transitioned to his home po rate control regimen when he is not NPO. We appreciate cardiology's consult. - Mechanical aortic valve: Needs anticoagulation but since patient has had retroperitoneal bleed we will hold . Need IR to review films and comment on risk of progression. Hemoglobin has dropped by 2.2 since he has been in the hospital. Will resume coumadin when we are able.

## 2012-02-22 NOTE — Clinical Documentation Improvement (Signed)
Anemia Blood Loss Clarification  THIS DOCUMENT IS NOT A PERMANENT PART OF THE MEDICAL RECORD  RESPOND TO THE THIS QUERY, FOLLOW THE INSTRUCTIONS BELOW:  1. If needed, update documentation for the patient's encounter via the notes activity.  2. Access this query again and click edit on the In [employer].  3. After updating, or not, click F2 to complete all highlighted (required) fields concerning your review. Select "additional documentation in the medical record" OR "no additional documentation provided".  4. Click Sign note button.  5. The deficiency will fall out of your In Basket *Please let us know if you are not able to complete this workflow by phone or e-mail (listed below).        02/22/12  Dear Dr.Chambliss/Associates  In an effort to better capture your patient's severity of illness, reflect appropriate length of stay and utilization of resources, a review of the patient medical record has revealed the following indicators.    Based on your clinical judgment, please clarify and document in a progress note and/or discharge summary the clinical condition associated with the following supporting information:  In responding to this query please exercise your independent judgment.  The fact that a query is asked, does not imply that any particular answer is desired or expected.   Possible Clinical Conditions?   " Expected Acute Blood Loss Anemia  " Acute Blood Loss Anemia  " Acute on chronic blood loss anemia  " Precipitous drop in Hematocrit  " Other Condition  " Cannot Clinically Determine    Supporting Information: Patient admitted with retroperitoneal bleed, s/p radio frequency ablation, left renal mass on 02/19/12.   Diagnostics: H&H on 12/15:   13.1/38.6 H&Hon 12/16:    10.4/31.1   Reviewed:  no additional documentation provided This is off H&P. First impression prior to study results. Future notes will contain the information you will need.  Thank  You,  Marciano Sequin,  Clinical Documentation Specialist:  Pager: [phone]  Phone:[phone]  Health Information Management [place]

## 2012-02-22 NOTE — Significant Event (Signed)
0905am-Spoken with MD Ashley Royalty at bedside regarding patient's SBP in the 80s. Pt asymptomatic. HR 60-65, atrial flutter. MD Ashley Royalty agreed for Cardizem drip to be turned off as heart rate is controlled. Also, clarified regarding pt inpatient's status as there was no order. Per MD, verbal order given that pt is a stepdown overflow. Will continue to monitor. Jhalen Eley, Charity fundraiser.

## 2012-02-22 NOTE — Discharge Summary (Signed)
Physician Discharge Summary  Patient ID: Juan Wilkerson MRN: [id number] DOB/AGE: Aug 04, 1940 71 y.o.  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 Patient was on coumadin for a.fib but transitioned to lovenox for recent ablation procedure. Upon admission held all anticoagulate meds. Had procedure on 12/13 and now found to have retroperitoneal bleed on CT. Initial Hgb in ED stable.   Atrial fibrillation: Currently in a. Fib with RVR. Placed on diltiazem drip in ED. Continued metoprolol. Cardiology consulted. Given mechanical valve as well as a. Fib started heparin drip as soon as bleed was confirmed stable by CTA on 12/19 and was continued as bridge until coumadin levels were therapeutic.  HTN: Has been stable on metoprolol home dose after diltizem drip was discontinued. Discontinued his Norvasc.  Renal mass: S/p core biopsy and cryo ablation on 12/13. Oncocytoma of the left kidney.   Consults: Interventional radiology, cardiology, Urology  Significant Diagnostic Studies:   Ct Abdomen Pelvis W Contrast 02/21/2012 IMPRESSION:    Large retroperitoneal hemorrhage originating from the upper pole of the left kidney at the site of tumor ablation.  Secondary mass effect upon the kidney and adjacent bowel and bladder.  Critical Value/emergent results were called by telephone at the time of interpretation on 02/21/2012 at  7:21 p.m. to  Dr. Linwood Dibbles and to Dr. Malachy Moan,, who verbally acknowledged these results.    Original Report Authenticated By: Francene Boyers, M.D.    Ct Angio Abd/pel W/ And/or W/o 02/24/2012   IMPRESSION:  1.  Slight interval decrease in size of left perinephric/retroperitoneal hematoma. No new components. 2.  No evidence of active extravasation or pseudoaneurysm.   Original Report Authenticated By: D. Andria Rhein, MD     Treatments: IV hydration, analgesia: Dilaudid, cardiac meds: metoprolol and diltiazem drip and anticoagulation: heparin drip  and warfarin  Discharge Exam: Blood pressure 122/66, pulse 95, temperature 99 F (37.2 C), temperature source Oral, resp. rate 19, height 6\' 4"  (1.93 m), weight 213 lb 13.5 oz (97 kg), SpO2 97.00%.  Gen: NAD. Afebrile.  CV: mild tachycardia, mechanical valve appreciated.  Lungs: CTAB. No wheezing, rhonchi or rales.  Abd: Soft. Mild tenderness on left flank. Fading areas of ecchymosis in pubic area and left flank, extends to scrotum and shaft of penis are stable.  EXT: NT. No erythema, trace edema bilateral LE  Neuro: A/O x3, normal speech.   Disposition: 01-Home or Self Care     . metoprolol succinate  25 mg Oral QHS  . metoprolol succinate  50 mg Oral Daily  . potassium chloride SA  20 mEq Oral BID  . sodium chloride  3 mL Intravenous Q12H  . Tamsulosin HCl  0.4 mg Oral Daily  . Warfarin - Pharmacist Dosing Inpatient   Does not apply q1800   acetaminophen, acetaminophen, HYDROmorphone (DILAUDID) injection, ondansetron (ZOFRAN) IV, ondansetron  Outpatient recommendations:  - outpatient coumadin 3mg , with INR checks with [facility]. [place] at [facility] starting 12/27 - Outpt urology follow up closely for chronic urinary retention. - Kidney BX:  oncocytoma with focal psammoma bodies  SignedFelix Pacini 02/25/2012, 12:02 PM

## 2012-02-22 NOTE — Progress Notes (Signed)
Subjective: Pt seen with Dr. Richarda Overlie Pt s/p (L)renal mass cryoablation procedure on 12/13. Did well and was discharged stable 12/14. Hgb13.1 at discharge.   Has now been admitted with RP hematoma as well as afib with RVR, now rate controlled. Hgb has dropped from 13.1 to 10.9 and now remains at 10.4 Currently pt feels ok, still some soreness (L)flank and back. Denies N/V.   Objective: Physical Exam: BP 114/58  Pulse 68  Temp 98.1 F (36.7 C) (Oral)  Resp 19  Ht 6\' 2"  (1.88 m)  Wt 216 lb 7.9 oz (98.2 kg)  BMI 27.80 kg/m2  SpO2 95% General: A&O, NAD, nontoxic appearing Abd: soft, ND, NT (L)flank mildly tender, procedure site c/d/i, no hematoma Foley in place, good UOP, no gross hematuria    Labs: CBC  Basename 02/22/12 0858 02/22/12 0233  WBC 11.4* 14.0*  HGB 10.4* 10.9*  HCT 31.1* 31.8*  PLT 135* 121*   BMET  Basename 02/22/12 0858 02/21/12 1520  NA 139 133*  K 4.4 3.6  CL 104 97  CO2 26 24  GLUCOSE 132* 172*  BUN 18 14  CREATININE 0.96 0.72  CALCIUM 8.8 9.5   LFT  Basename 02/21/12 1520  PROT 7.0  ALBUMIN 3.7  AST 64*  ALT 50  ALKPHOS 92  BILITOT 0.9  BILIDIR --  IBILI --  LIPASE 17   PT/INR  Basename 02/21/12 1613  LABPROT 14.3  INR 1.13     Studies/Results: Ct Abdomen Pelvis W Contrast  02/21/2012  *RADIOLOGY REPORT*  Clinical Data: Flank pain.  Recent left renal mass ablation.  CT ABDOMEN AND PELVIS WITH CONTRAST  Technique:  Multidetector CT imaging of the abdomen and pelvis was performed following the standard protocol during bolus administration of intravenous contrast.  Contrast: OMNIPAQUE IOHEXOL 300 MG/ML  SOLN  Comparison: CT images dated 12/13 and 11/20/2011  Findings: The patient has a large perirenal and retroperitoneal hemorrhage.  The left kidney is superiorly and anteriorly displaced by the hemorrhage.  There is no obstruction of the kidney.  The left upper pole renal mass that was ablated is visible.  There is also  blood extending across the midline adjacent to the duodenum and into the right pericolic gutter.  The hematoma extends into the left side of the pelvis and slightly compresses the left side of the bladder.  There is also a small amount of blood in the  left upper quadrant around the spleen.  Foley catheter in the bladder.  Delayed imaging demonstrates normal excretion of contrast from both kidneys.  Incidental note is made of multiple small cysts in the liver as well as small cysts in both kidneys.  Pancreas and spleen and adrenal glands are normal.  The bowel is normal except for being displaced by the large hematoma.  No acute osseous abnormality.  IMPRESSION:    Large retroperitoneal hemorrhage originating from the upper pole of the left kidney at the site of tumor ablation.  Secondary mass effect upon the kidney and adjacent bowel and bladder.  Critical Value/emergent results were called by telephone at the time of interpretation on 02/21/2012 at  7:21 p.m. to  Dr. Linwood Dibbles and to Dr. Malachy Moan,, who verbally acknowledged these results.   Original Report Authenticated By: Francene Boyers, M.D.     Assessment/Plan: Retroperitoneal hematoma s/p (L)renal mass cryoablation 12/13 Anemia secondary to above Currently pt hemodynamically stable and Hgb/Hct essentially unchanged since admit. Suspect no active bleeding but would cont no anticoagulation overnight  tonight. If am labs ok, would resume Coumadin only, no Lovenox(as per Cardiology recs) OK to begin/advance diet tonight.    LOS: 1 day    Brayton El PA-C 02/22/2012 2:58 PM

## 2012-02-23 DIAGNOSIS — D649 Anemia, unspecified: Secondary | ICD-10-CM | POA: Diagnosis present

## 2012-02-23 DIAGNOSIS — I4891 Unspecified atrial fibrillation: Secondary | ICD-10-CM

## 2012-02-23 LAB — ABO/RH: ABO/RH(D): A POS

## 2012-02-23 LAB — CBC
HCT: 25.3 % — ABNORMAL LOW (ref 39.0–52.0)
Hemoglobin: 9 g/dL — ABNORMAL LOW (ref 13.0–17.0)
MCH: 30.6 pg (ref 26.0–34.0)
MCH: 29.9 pg (ref 26.0–34.0)
MCHC: 33.2 g/dL (ref 30.0–36.0)
MCV: 90 fL (ref 78.0–100.0)
Platelets: 125 10*3/uL — ABNORMAL LOW (ref 150–400)
Platelets: 122 10*3/uL — ABNORMAL LOW (ref 150–400)
RBC: 2.94 MIL/uL — ABNORMAL LOW (ref 4.22–5.81)
RDW: 13.7 % (ref 11.5–15.5)
WBC: 11.8 10*3/uL — ABNORMAL HIGH (ref 4.0–10.5)
WBC: 11.2 10*3/uL — ABNORMAL HIGH (ref 4.0–10.5)

## 2012-02-23 MED ORDER — FUROSEMIDE 10 MG/ML IJ SOLN
INTRAMUSCULAR | Status: AC
  Filled 2012-02-23: qty 4

## 2012-02-23 MED ORDER — METOPROLOL SUCCINATE ER 25 MG PO TB24
25.0000 mg | ORAL_TABLET | Freq: Every day | ORAL | Status: DC
  Administered 2012-02-24 – 2012-02-26 (×3): 25 mg via ORAL
  Filled 2012-02-23 (×3): qty 1

## 2012-02-23 MED ORDER — DILTIAZEM HCL ER COATED BEADS 240 MG PO CP24
240.0000 mg | ORAL_CAPSULE | Freq: Every day | ORAL | Status: DC
  Administered 2012-02-23: 240 mg via ORAL
  Filled 2012-02-23: qty 1

## 2012-02-23 MED ORDER — SCD SOFT SLEEVES/KNEE LENGTH MISC: Freq: Every day | Status: DC

## 2012-02-23 MED ORDER — NITROGLYCERIN 0.4 MG SL SUBL
SUBLINGUAL_TABLET | SUBLINGUAL | Status: AC
  Filled 2012-02-23: qty 25

## 2012-02-23 MED ORDER — POLYETHYLENE GLYCOL 3350 17 G PO PACK
17.0000 g | PACK | Freq: Every day | ORAL | Status: DC
  Administered 2012-02-23 – 2012-02-29 (×6): 17 g via ORAL
  Filled 2012-02-23 (×8): qty 1

## 2012-02-23 MED ORDER — FUROSEMIDE 10 MG/ML IJ SOLN
20.0000 mg | Freq: Once | INTRAMUSCULAR | Status: AC
  Administered 2012-02-23: 20 mg via INTRAVENOUS

## 2012-02-23 MED FILL — Diltiazem HCl IV For Soln 100 MG: INTRAVENOUS | Qty: 100 | Status: AC

## 2012-02-23 MED FILL — Sodium Chloride IV Soln 0.9%: INTRAVENOUS | Qty: 100 | Status: AC

## 2012-02-23 NOTE — Progress Notes (Signed)
Patient ID: Juan Wilkerson, male   DOB: [birth year], 71 y.o.   MRN: [id number] [facility] Medicine Teaching Service PGY-1 Progress Note   Overnight Events: Patient is doing ok today. Complains he has not had a BM yet and is having mild right flank pain. He is hungry and wanting his breakfast.   Objective: Temp:  [97.4 F (36.3 C)-98.2 F (36.8 C)] 98.1 F (36.7 C) (12/17 0747) Pulse Rate:  [56-116] 79  (12/17 0747) Cardiac Rhythm:  [-] Atrial fibrillation (12/17 0200) Resp:  [15-25] 20  (12/17 0747) BP: (84-134)/(51-80) 134/60 mmHg (12/17 0747) SpO2:  [74 %-99 %] 96 % (12/17 0747) Weight change:  BP 134/60  Pulse 79  Temp 98.1 F (36.7 C) (Oral)  Resp 20  Ht 6\' 4"  (1.93 m)  Wt 216 lb 7.9 oz (98.2 kg)  BMI 26.35 kg/m2  SpO2 96%  Physical Exam: Gen: NAD. Lying in bed comfortable.  CV: Sinus Rhythm to Irregular irregular. 1/6 SM. Tachycardic at times. Lungs: CTAB. No wheezing, rhonchi or rales.  Abd: Soft.Mildly tender RLQ and Right flank. Mildly Distended. BS+.  EXT: NT. No erythema. No edema.  Hemoglobin & Hematocrit     Component Value Date/Time   HGB 9.0* 02/22/2012 2037   HCT 26.9* 02/22/2012 2037   Pending HGB today  Assessment and Plan: 71 yo male with history of A. 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   Retroperitoneal bleed: Was on coumadin for a.fib but transitioned to lovenox for recent ablation procedure. Had procedure on 12/13 and now found to have retroperitoneal bleed on CT.  - Initial Hgb in ED was 13.1-->10.8-->10.4--> pending - Will continue to monitor HGB.  Atrial fibrillation: Currently in a. Fib with RVR. Placed on diltiazem drip in ED. Cardiology consult done in ED, appreciate input.  -  Start po diltiazem 240 mg daily (1000).  Mechanical aortic valve: Needs anticoagulation but has had retroperitoneal bleed. Hemoglobin dropped again yesterday  but flank pain has resolved.  - Awaiting CBC this morning (has not yet resulted). If stable or only mildly reduced, will resume coumadin without bridging Lovenox today.   HTN:  - Held metoprolol and amlodipine for now.   Renal mass: S/p core biopsy and cryo ablation on 12/13.  -Kidney, biopsy, left - ONCOCYTOMA WITH FOCAL PSAMMOMA BODIES.   FEN/GI: NS @ 139mL/hr, Ice chips only for now  PPx: SCD's  Dispo: Pending further work up and improvement

## 2012-02-23 NOTE — Progress Notes (Signed)
FMTS Attending Daily Note:  Renold Don MD  [phone] pager  Family Practice pager:  [phone] I have seen and examined this patient and have reviewed their chart. I have discussed this patient with the resident. I agree with the resident's findings, assessment and care plan.  Additionally:  - Hgb has dropped 1.5 points overnight (10.4 --> 9), but holding steady this AM.   - On my examination, patient's HR is IRR/IRR and tachycardic to 130s persistently  - Has moderate LLQ pain, mild RLQ pain, and moderate Left flank pain. - Continue to hold on anticoagulation as evidence of bleeding yesterday, though hopefully this has stabilized.   - If evidence of continued bleeding, will consult urology. - IV Dilt has been stopped, spoke with nurse to go ahead and provide patient with PO Dilt.   - will continue to watch closely today.  - Greatly appreciate cardiology and IR input.

## 2012-02-23 NOTE — Progress Notes (Signed)
Patient reported having a "twinge" of pain in his right chest rated 2/10 on pain scale with no associated dyspnea.  Temperature at the time had increased to 99.1 from 97.9. BP 112/45, HR 77.  Blood transfusion was stopped due to complaint of chest pain and MD contacted. Pain went away promptly without intervention. Consulted with MD, blood transfusion resumed at a slower rate.  EKG in chart for MD review. Will continue to monitor patient.

## 2012-02-23 NOTE — Progress Notes (Signed)
Patient ID: Juan Wilkerson, male   DOB: May 11, 1940, 71 y.o.   MRN: [id number]     SUBJECTIVE: Pain in flank mostly gone this morning.  Yesterday, hgb dropped from 10.4 => 9.0.  He is off diltiazem currently with HR 90s-100s atrial flutter.     . sodium chloride  3 mL Intravenous Q12H  diltiazem gtt    Filed Vitals:   02/22/12 2330 02/23/12 0000 02/23/12 0200 02/23/12 0400  BP:  107/56 126/73 123/67  Pulse: 77 64 102 109  Temp:  98.2 F (36.8 C)  97.6 F (36.4 C)  TempSrc:  Oral    Resp: 17 18 17 15   Height:      Weight:      SpO2: 93% 95% 96% 95%    Intake/Output Summary (Last 24 hours) at 02/23/12 0729 Last data filed at 02/22/12 2200  Gross per 24 hour  Intake 1858.42 ml  Output    835 ml  Net 1023.42 ml    LABS: Basic Metabolic Panel:  Basename 02/22/12 0858 02/21/12 1520  NA 139 133*  K 4.4 3.6  CL 104 97  CO2 26 24  GLUCOSE 132* 172*  BUN 18 14  CREATININE 0.96 0.72  CALCIUM 8.8 9.5  MG -- --  PHOS -- --   Liver Function Tests:  Basename 02/21/12 1520  AST 64*  ALT 50  ALKPHOS 92  BILITOT 0.9  PROT 7.0  ALBUMIN 3.7    Basename 02/21/12 1520  LIPASE 17  AMYLASE --   CBC:  Basename 02/22/12 2037 02/22/12 0858 02/21/12 1520  WBC 12.4* 11.4* --  NEUTROABS -- -- 13.5*  HGB 9.0* 10.4* --  HCT 26.9* 31.1* --  MCV 90.6 90.9 --  PLT 125* 135* --   Cardiac Enzymes: No results found for this basename: CKTOTAL:3,CKMB:3,CKMBINDEX:3,TROPONINI:3 in the last 72 hours BNP: No components found with this basename: POCBNP:3 D-Dimer: No results found for this basename: DDIMER:2 in the last 72 hours Hemoglobin A1C: No results found for this basename: HGBA1C in the last 72 hours Fasting Lipid Panel: No results found for this basename: CHOL,HDL,LDLCALC,TRIG,CHOLHDL,LDLDIRECT in the last 72 hours Thyroid Function Tests: No results found for this basename: TSH,T4TOTAL,FREET3,T3FREE,THYROIDAB in the last 72 hours Anemia Panel: No results found for this  basename: VITAMINB12,FOLATE,FERRITIN,TIBC,IRON,RETICCTPCT in the last 72 hours  RADIOLOGY: Ct Abdomen Pelvis W Contrast  02/21/2012  *RADIOLOGY REPORT*  Clinical Data: Flank pain.  Recent left renal mass ablation.  CT ABDOMEN AND PELVIS WITH CONTRAST  Technique:  Multidetector CT imaging of the abdomen and pelvis was performed following the standard protocol during bolus administration of intravenous contrast.  Contrast: OMNIPAQUE IOHEXOL 300 MG/ML  SOLN  Comparison: CT images dated 12/13 and 11/20/2011  Findings: The patient has a large perirenal and retroperitoneal hemorrhage.  The left kidney is superiorly and anteriorly displaced by the hemorrhage.  There is no obstruction of the kidney.  The left upper pole renal mass that was ablated is visible.  There is also blood extending across the midline adjacent to the duodenum and into the right pericolic gutter.  The hematoma extends into the left side of the pelvis and slightly compresses the left side of the bladder.  There is also a small amount of blood in the  left upper quadrant around the spleen.  Foley catheter in the bladder.  Delayed imaging demonstrates normal excretion of contrast from both kidneys.  Incidental note is made of multiple small cysts in the liver as well as small  cysts in both kidneys.  Pancreas and spleen and adrenal glands are normal.  The bowel is normal except for being displaced by the large hematoma.  No acute osseous abnormality.  IMPRESSION:    Large retroperitoneal hemorrhage originating from the upper pole of the left kidney at the site of tumor ablation.  Secondary mass effect upon the kidney and adjacent bowel and bladder.  Critical Value/emergent results were called by telephone at the time of interpretation on 02/21/2012 at  7:21 p.m. to  Dr. Linwood Dibbles and to Dr. Malachy Moan,, who verbally acknowledged these results.   Original Report Authenticated By: Francene Boyers, M.D.    Ct Guide Tissue  Ablation  02/19/2012  *RADIOLOGY REPORT*  Clinical Data:  2.5 cm posterior left renal mass.  The patient presents for biopsy and cryoablation.  CT-GUIDED CORE BIOPSY OF LEFT RENAL MASS. CT-GUIDED PERCUTANEOUS CRYOABLATION OF LEFT RENAL MASS.  Anesthesia:  General  Medications:  2 grams IV Ancef. As antibiotic prophylaxis, Ancef was ordered pre-procedure and administered intravenously within one hour of incision.  Procedure:  The procedure, risks, benefits, and alternatives were explained to the patient.  Questions regarding the procedure were encouraged and answered.  The patient understands and consents to the procedure.  The patient was placed under general anesthesia.  Initial unenhanced CT was performed in a prone position to localize the left renal mass.  The left flank region was prepped with Betadine in a sterile fashion, and a sterile drape was applied covering the operative field.  A sterile gown and sterile gloves were used for the procedure.  A 17 gauge trocar needle was advanced into the left renal mass. Core biopsy was performed with an 18 gauge automated core biopsy device.  A total of two core samples were submitted in formalin for pathologic analysis.  Under CT guidance, a total of three Galil Ice Rod Plus percutaneous cryoablation probes were advanced into the left renal mass.  Probe positioning was confirmed by CT prior to cryoablation. Prior to ablation, a 22 gauge spinal needle was advanced between the kidney and the spleen and a total of approximately 90 ml of sterile saline injected for hydrodissection.  Cryoablation was performed through the three probes simultaneously. Initial 10 minute cycle of cryoablation was performed.  This was followed by a 8 minute thaw cycle.  A second 10 minute cycle of cryoablation was then performed.  During ablation, periodic CT imaging was performed to monitor ice ball formation and morphology. After active thaw, the cryoablation probes were removed.   Post-procedural CT was performed.  Complications: None  Findings:  The exophytic mass emanating from the posterior aspect of the upper to mid kidney was well localized by unenhanced CT. Due to proximity of the spleen, hydrodissection was performed prior to treatment.  Biopsy was performed of the lesion prior to cryoablation.  Imaging during treatment shows adequate ice ball formation encompassing the lesion.  There were no immediate bleeding complications.  IMPRESSION:  CT guided percutaneous core biopsy and cryoablation of a left renal mass.  The patient will be observed overnight.  Initial follow-up will be performed in approximately 4 weeks.   Original Report Authenticated By: Irish Lack, M.D.    Ct Biopsy  02/19/2012  *RADIOLOGY REPORT*  Clinical Data:  2.5 cm posterior left renal mass.  The patient presents for biopsy and cryoablation.  CT-GUIDED CORE BIOPSY OF LEFT RENAL MASS. CT-GUIDED PERCUTANEOUS CRYOABLATION OF LEFT RENAL MASS.  Anesthesia:  General  Medications:  2 grams IV  Ancef. As antibiotic prophylaxis, Ancef was ordered pre-procedure and administered intravenously within one hour of incision.  Procedure:  The procedure, risks, benefits, and alternatives were explained to the patient.  Questions regarding the procedure were encouraged and answered.  The patient understands and consents to the procedure.  The patient was placed under general anesthesia.  Initial unenhanced CT was performed in a prone position to localize the left renal mass.  The left flank region was prepped with Betadine in a sterile fashion, and a sterile drape was applied covering the operative field.  A sterile gown and sterile gloves were used for the procedure.  A 17 gauge trocar needle was advanced into the left renal mass. Core biopsy was performed with an 18 gauge automated core biopsy device.  A total of two core samples were submitted in formalin for pathologic analysis.  Under CT guidance, a total of three Galil  Ice Rod Plus percutaneous cryoablation probes were advanced into the left renal mass.  Probe positioning was confirmed by CT prior to cryoablation. Prior to ablation, a 22 gauge spinal needle was advanced between the kidney and the spleen and a total of approximately 90 ml of sterile saline injected for hydrodissection.  Cryoablation was performed through the three probes simultaneously. Initial 10 minute cycle of cryoablation was performed.  This was followed by a 8 minute thaw cycle.  A second 10 minute cycle of cryoablation was then performed.  During ablation, periodic CT imaging was performed to monitor ice ball formation and morphology. After active thaw, the cryoablation probes were removed.  Post-procedural CT was performed.  Complications: None  Findings:  The exophytic mass emanating from the posterior aspect of the upper to mid kidney was well localized by unenhanced CT. Due to proximity of the spleen, hydrodissection was performed prior to treatment.  Biopsy was performed of the lesion prior to cryoablation.  Imaging during treatment shows adequate ice ball formation encompassing the lesion.  There were no immediate bleeding complications.  IMPRESSION:  CT guided percutaneous core biopsy and cryoablation of a left renal mass.  The patient will be observed overnight.  Initial follow-up will be performed in approximately 4 weeks.   Original Report Authenticated By: Irish Lack, M.D.     PHYSICAL EXAM General: NAD Neck: No JVD, no thyromegaly or thyroid nodule.  Lungs: Clear to auscultation bilaterally with normal respiratory effort. CV: Nondisplaced PMI.  Heart mildly tachy, irregular S1/S2, no S3/S4, mechanical S2, no murmur.  No peripheral edema.  No carotid bruit.  Normal pedal pulses.  Abdomen: Soft, nontender, no hepatosplenomegaly, no distention.  Neurologic: Alert and oriented x 3.  Psych: Normal affect. Extremities: No clubbing or cyanosis.   TELEMETRY: Reviewed telemetry pt in  atrial flutter, HR variable in the 60s-90s  ASSESSMENT AND PLAN:  71 yo with history of Marfans, chronic atrial flutter/fibrillation and mechanical aortic valve presented with retroperitoneal bleed after renal cryoablation procedure (had bridging Lovenox).   1. Atrial flutter: Start po diltiazem 240 mg daily.   2. Mechanical aortic valve:  Needs anticoagulation but has had retroperitoneal bleed.  Hemoglobin dropped again yesterday but flank pain has resolved.  Awaiting CBC this morning (has not yet resulted).  If stable or only mildly reduced, would resume coumadin without bridging Lovenox today.   Marca Ancona 02/23/2012 7:29 AM

## 2012-02-23 NOTE — Progress Notes (Signed)
  Subjective: left renal lesion cryoablation 12/13 dc'd 12/14 with hgb 13.1 Developed abd and back pain 12/15 CT shows retroperitoneal bleed Pt now with less pain hgb stable at 9.0 12/16 and 12/17    Objective: Vital signs in last 24 hours: Temp:  [97.4 F (36.3 C)-98.2 F (36.8 C)] 98.1 F (36.7 C) (12/17 0747) Pulse Rate:  [56-134] 79  (12/17 0747) Resp:  [15-25] 20  (12/17 0747) BP: (92-134)/(51-80) 134/60 mmHg (12/17 0747) SpO2:  [74 %-99 %] 96 % (12/17 0747) Last BM Date: 02/21/12  Intake/Output from previous day: 12/16 0701 - 12/17 0700 In: 2758.4 [P.O.:240; I.V.:2518.4] Out: 1835 [Urine:1835] Intake/Output this shift: Total I/O In: 200 [I.V.:200] Out: 225 [Urine:225]  PE:  Vss; afeb H/H stable abd pain slight Back pain less   Lab Results:   [facility] 02/23/12 0846 02/22/12 2037  WBC 11.8* 12.4*  HGB 9.0* 9.0*  HCT 26.4* 26.9*  PLT 125* 125*   BMET  Basename 02/22/12 0858 02/21/12 1520  NA 139 133*  K 4.4 3.6  CL 104 97  CO2 26 24  GLUCOSE 132* 172*  BUN 18 14  CREATININE 0.96 0.72  CALCIUM 8.8 9.5   PT/INR  Basename 02/21/12 1613  LABPROT 14.3  INR 1.13   ABG No results found for this basename: PHART:2,PCO2:2,PO2:2,HCO3:2 in the last 72 hours  Studies/Results: Ct Abdomen Pelvis W Contrast  02/21/2012  *RADIOLOGY REPORT*  Clinical Data: Flank pain.  Recent left renal mass ablation.  CT ABDOMEN AND PELVIS WITH CONTRAST  Technique:  Multidetector CT imaging of the abdomen and pelvis was performed following the standard protocol during bolus administration of intravenous contrast.  Contrast: OMNIPAQUE IOHEXOL 300 MG/ML  SOLN  Comparison: CT images dated 12/13 and 11/20/2011  Findings: The patient has a large perirenal and retroperitoneal hemorrhage.  The left kidney is superiorly and anteriorly displaced by the hemorrhage.  There is no obstruction of the kidney.  The left upper pole renal mass that was ablated is visible.  There is also  blood extending across the midline adjacent to the duodenum and into the right pericolic gutter.  The hematoma extends into the left side of the pelvis and slightly compresses the left side of the bladder.  There is also a small amount of blood in the  left upper quadrant around the spleen.  Foley catheter in the bladder.  Delayed imaging demonstrates normal excretion of contrast from both kidneys.  Incidental note is made of multiple small cysts in the liver as well as small cysts in both kidneys.  Pancreas and spleen and adrenal glands are normal.  The bowel is normal except for being displaced by the large hematoma.  No acute osseous abnormality.  IMPRESSION:    Large retroperitoneal hemorrhage originating from the upper pole of the left kidney at the site of tumor ablation.  Secondary mass effect upon the kidney and adjacent bowel and bladder.  Critical Value/emergent results were called by telephone at the time of interpretation on 02/21/2012 at  7:21 p.m. to  Dr. Linwood Dibbles and to Dr. Malachy Moan,, who verbally acknowledged these results.   Original Report Authenticated By: Francene Boyers, M.D.     Anti-infectives: Anti-infectives    None      Assessment/Plan: s/p Left renal lesion cryoablation 02/19/12 dc'd 12/14 Retroperitoneal bleed 12/15 Now stable Will follow   LOS: 2 days    Breslin Hemann A 02/23/2012

## 2012-02-24 ENCOUNTER — Inpatient Hospital Stay (HOSPITAL_COMMUNITY): Payer: Medicare Other

## 2012-02-24 LAB — CBC
HCT: 27.3 % — ABNORMAL LOW (ref 39.0–52.0)
HCT: 28.3 % — ABNORMAL LOW (ref 39.0–52.0)
Hemoglobin: 9.3 g/dL — ABNORMAL LOW (ref 13.0–17.0)
MCH: 30.1 pg (ref 26.0–34.0)
MCH: 30.2 pg (ref 26.0–34.0)
MCHC: 33.9 g/dL (ref 30.0–36.0)
MCHC: 34.1 g/dL (ref 30.0–36.0)
MCHC: 33.9 g/dL (ref 30.0–36.0)
MCV: 88.9 fL (ref 78.0–100.0)
MCV: 88.4 fL (ref 78.0–100.0)
MCV: 89 fL (ref 78.0–100.0)
Platelets: 108 10*3/uL — ABNORMAL LOW (ref 150–400)
Platelets: 105 10*3/uL — ABNORMAL LOW (ref 150–400)
Platelets: 131 10*3/uL — ABNORMAL LOW (ref 150–400)
RBC: 2.79 MIL/uL — ABNORMAL LOW (ref 4.22–5.81)
RBC: 2.84 MIL/uL — ABNORMAL LOW (ref 4.22–5.81)
RDW: 14.5 % (ref 11.5–15.5)
RDW: 14.4 % (ref 11.5–15.5)
RDW: 14.4 % (ref 11.5–15.5)
WBC: 10.8 10*3/uL — ABNORMAL HIGH (ref 4.0–10.5)
WBC: 9.2 10*3/uL (ref 4.0–10.5)

## 2012-02-24 LAB — TYPE AND SCREEN: ABO/RH(D): A POS

## 2012-02-24 LAB — PROTIME-INR
INR: 1.2 (ref 0.00–1.49)
Prothrombin Time: 15 seconds (ref 11.6–15.2)

## 2012-02-24 MED ORDER — WARFARIN SODIUM 2.5 MG PO TABS
2.5000 mg | ORAL_TABLET | Freq: Once | ORAL | Status: AC
  Administered 2012-02-24: 2.5 mg via ORAL
  Filled 2012-02-24: qty 1

## 2012-02-24 MED ORDER — IOHEXOL 350 MG/ML SOLN
100.0000 mL | Freq: Once | INTRAVENOUS | Status: AC | PRN
  Administered 2012-02-24: 100 mL via INTRAVENOUS

## 2012-02-24 MED ORDER — WARFARIN - PHARMACIST DOSING INPATIENT: Freq: Every day | Status: DC

## 2012-02-24 NOTE — Progress Notes (Addendum)
ANTICOAGULATION CONSULT NOTE - Initial Consult  Pharmacy Consult for Coumadin Indication: History of Mechanical aortic valve and afib/aflutter   No Known Allergies  Patient Measurements: Height: 6\' 4"  (193 cm) Weight: 214 lb 4.6 oz (97.2 kg) IBW/kg (Calculated) : 86.8    Vital Signs: Temp: 98.8 F (37.1 C) (12/18 1125) Temp src: Oral (12/18 1125) BP: 118/60 mmHg (12/18 1125) Pulse Rate: 79  (12/18 1125)  Labs:  Basename 02/24/12 1347 02/24/12 0849 02/24/12 0227 02/22/12 0858 02/21/12 1613  HGB 8.6* 9.3* -- -- --  HCT 25.1* 27.3* 24.8* -- --  PLT 105* 124* 108* -- --  APTT -- -- -- -- 37  LABPROT -- -- -- -- 14.3  INR -- -- -- -- 1.13  HEPARINUNFRC -- -- -- -- --  CREATININE -- -- -- 0.96 --  CKTOTAL -- -- -- -- --  CKMB -- -- -- -- --  TROPONINI -- -- -- -- --    Estimated Creatinine Clearance: 85.4 ml/min (by C-G formula based on Cr of 0.96).   Medical History: Past Medical History  Diagnosis Date  . Marfan's syndrome with aortic dilation   . Hypertension   . Aortic aneurysm 1994    From presumed Marfan's Syndrome.  Followed by Dr Jens Som.  Aneurysm involving the proximal descending thoracic aorta .    Marland Kitchen Pulmonary nodules     Found on chest CT 08/2007.  Monitoring with yearly CT.  3.37mm nodule in R iddle lobe.  Marland Kitchen Hyperlipidemia   . Atrial fibrillation     Followed by Dr Jens Som.  On coumadin.  . Atrial flutter     Followed by Dr Jens Som  . Osteoarthritis   . History of blood transfusion   Mechanical aortic valve  Medications:  Prescriptions prior to admission  Medication Sig Dispense Refill  . acetaminophen (TYLENOL) 500 MG tablet Take 1,000 mg by mouth 2 (two) times daily as needed. As needed for pain      . amLODipine (NORVASC) 5 MG tablet Take 5 mg by mouth every morning.      . enoxaparin (LOVENOX) 100 MG/ML injection Inject 100 mg into the skin every 12 (twelve) hours.      . Glucosamine-Chondroit-Vit C-Mn (GLUCOSAMINE-CHONDROITIN) TABS Take 2  tablets by mouth every morning.       . hydrocortisone 1 % cream Apply topically. Apply as needed for itching.      . metoprolol succinate (TOPROL-XL) 25 MG 24 hr tablet Take 25 mg by mouth every morning.      . Multiple Vitamin (MULTIVITAMIN) capsule Take 1 capsule by mouth every morning.       . pravastatin (PRAVACHOL) 40 MG tablet Take 40 mg by mouth every evening.      . warfarin (COUMADIN) 3 MG tablet Take 3 mg by mouth every morning.        Assessment: 71 yo male with history of Marfans, chronic atrial flutter/fibrillation and mechanical aortic valve (on chronic warfarin) who was  admitted on 12/15 with retroperitoneal bleed after renal cryoablation procedure done on 02/19/12.  Prior to the renal cryoablation procedure the patient was started  On Lovenox bridge BID x 10 on 12/7 pre procedure while coumadin held pre procedure (procedure 12/13). Was started back on Lovenox BID x 10 post procedure starting 12/14. Resumed warfarin 12/15 with 3mg  daily. Last taken on 02/21/12. Admit  INR = 1.13 on 02/21/12.  Coumadin and lovenox on hold since admission.   CT angio abd/pelvis done today. Dr. Lowella Dandy reveiwed  and feels hematoma unchanged, perhaps slightly smaller. MD does not think there is active bleeding and has ordered to restart coumadin. Without bridge with Lovenox as noted per cardiology.   H/H = 8.6/25.1  Pltc = 105K  Goal of Therapy:  INR = 2.5-3 (lower end of therapeutic range for mechanical aortic valve with afib/aflutter due to retroperitenal hematoma)  Monitor platelets by anticoagulation protocol: Yes   Plan:  Will plan to check INR today to help with dosing in this patient with retroperitoneal bleed (last INR done on 02/21/12).  Decreased H/H and pltc. INR daily.    Noah Delaine, RPh Clinical Pharmacist Pager: [phone] 02/24/2012,3:50 PM   Addendum:  INR = 1.2 today   Plan:  Give Coumadin 2.5mg  po tonight x 1  Follow up daily INR in AM.   Noah Delaine, RPh Clinical  Pharmacist Pager: [phone] 02/24/2012, 6:43 PM

## 2012-02-24 NOTE — Consult Note (Signed)
Reason for Consult:Retroperitoneal Hematoma After Renal Cryoablation Referring Physician: Pearlean Brownie MD  RETT STEHLIK is an 71 y.o. male.  HPI:   1 -Left Retroperitoneal Hematoma After Cryoablation - pt s/p left percutaneous renal cryo by Dr. 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 Began having left flank pain during Lovenox bridge and noted to have large left retroperitoneal hematoma by imaging 12/15. Anticoagulation held, Hgb dropped and transfused. Worry for continued drop post-transfusion prompted CT-Angiogram today (12/18) which fortunately showed stable hematoma and no active blush / bleed.  He is anticoagulated at baseline for mechanical aortic valve.  His primary urologist is Dr. Laverle Patter.  Past Medical History  Diagnosis Date  . Marfan's syndrome with aortic dilation   . Hypertension   . Aortic aneurysm 1994    From presumed Marfan's Syndrome.  Followed by Dr Jens Som.  Aneurysm involving the proximal descending thoracic aorta .    Marland Kitchen Pulmonary nodules     Found on chest CT 08/2007.  Monitoring with yearly CT.  3.15mm nodule in R iddle lobe.  Marland Kitchen Hyperlipidemia   . Atrial fibrillation     Followed by Dr Jens Som.  On coumadin.  . Atrial flutter     Followed by Dr Jens Som  . Osteoarthritis   . History of blood transfusion     Past Surgical History  Procedure Date  . Virtual colonoscopy 11/01/2005    Pt on chronic coumadin for Aortic valve replacement and Atrial fibrilliation therefore at high risk if stopped.  Need to repeat every 5 yrs.   . Aortic valve replacement 03/1993    St Jude Valve  . Hip fracture surgery 03/1992    s/p R hip Fx  . Vascular surgery   . Cardiac valve replacement   . Kidney surgery   . Coronary artery bypass graft     Family History  Problem Relation Age of Onset  . Cancer Brother   . Hypertension Brother   . Aortic aneurysm Maternal Aunt     Social History:  reports that he has never smoked. He  has never used smokeless tobacco. He reports that he does not drink alcohol or use illicit drugs.  Allergies: No Known Allergies  Medications: I have reviewed the patient's current medications.  Results for orders placed during the hospital encounter of 02/21/12 (from the past 48 hour(s))  CBC     Status: Abnormal   Collection Time   02/22/12  8:37 PM      Component Value Range Comment   WBC 12.4 (*) 4.0 - 10.5 K/uL    RBC 2.97 (*) 4.22 - 5.81 MIL/uL    Hemoglobin 9.0 (*) 13.0 - 17.0 g/dL    HCT 40.9 (*) 81.1 - 52.0 %    MCV 90.6  78.0 - 100.0 fL    MCH 30.3  26.0 - 34.0 pg    MCHC 33.5  30.0 - 36.0 g/dL    RDW 91.4  78.2 - 95.6 %    Platelets 125 (*) 150 - 400 K/uL   CBC     Status: Abnormal   Collection Time   02/23/12  8:46 AM      Component Value Range Comment   WBC 11.8 (*) 4.0 - 10.5 K/uL    RBC 2.94 (*) 4.22 - 5.81 MIL/uL    Hemoglobin 9.0 (*) 13.0 - 17.0 g/dL    HCT 21.3 (*) 08.6 - 52.0 %    MCV 89.8  78.0 - 100.0 fL  MCH 30.6  26.0 - 34.0 pg    MCHC 34.1  30.0 - 36.0 g/dL    RDW 16.1  09.6 - 04.5 %    Platelets 125 (*) 150 - 400 K/uL   CBC     Status: Abnormal   Collection Time   02/23/12  3:29 PM      Component Value Range Comment   WBC 11.2 (*) 4.0 - 10.5 K/uL    RBC 2.81 (*) 4.22 - 5.81 MIL/uL    Hemoglobin 8.4 (*) 13.0 - 17.0 g/dL    HCT 40.9 (*) 81.1 - 52.0 %    MCV 90.0  78.0 - 100.0 fL    MCH 29.9  26.0 - 34.0 pg    MCHC 33.2  30.0 - 36.0 g/dL    RDW 91.4  78.2 - 95.6 %    Platelets 122 (*) 150 - 400 K/uL   TYPE AND SCREEN     Status: Normal   Collection Time   02/23/12  5:17 PM      Component Value Range Comment   ABO/RH(D) A POS      Antibody Screen NEG      Sample Expiration 02/26/2012      Unit Number [id number]      Blood Component Type RED CELLS,LR      Unit division 00      Status of Unit ISSUED,FINAL      Transfusion Status OK TO TRANSFUSE      Crossmatch Result Compatible     ABO/RH     Status: Normal   Collection Time    02/23/12  5:18 PM      Component Value Range Comment   ABO/RH(D) A POS     PREPARE RBC (CROSSMATCH)     Status: Normal   Collection Time   02/23/12  5:30 PM      Component Value Range Comment   Order Confirmation ORDER PROCESSED BY BLOOD BANK     CBC     Status: Abnormal   Collection Time   02/24/12  2:27 AM      Component Value Range Comment   WBC 9.3  4.0 - 10.5 K/uL    RBC 2.79 (*) 4.22 - 5.81 MIL/uL    Hemoglobin 8.4 (*) 13.0 - 17.0 g/dL    HCT 29.5 (*) 28.4 - 52.0 %    MCV 88.9  78.0 - 100.0 fL    MCH 30.1  26.0 - 34.0 pg    MCHC 33.9  30.0 - 36.0 g/dL    RDW 13.2  44.0 - 10.2 %    Platelets 108 (*) 150 - 400 K/uL PLATELET COUNT CONFIRMED BY SMEAR  CBC     Status: Abnormal   Collection Time   02/24/12  8:49 AM      Component Value Range Comment   WBC 10.8 (*) 4.0 - 10.5 K/uL    RBC 3.10 (*) 4.22 - 5.81 MIL/uL    Hemoglobin 9.3 (*) 13.0 - 17.0 g/dL    HCT 72.5 (*) 36.6 - 52.0 %    MCV 88.1  78.0 - 100.0 fL    MCH 30.0  26.0 - 34.0 pg    MCHC 34.1  30.0 - 36.0 g/dL    RDW 44.0  34.7 - 42.5 %    Platelets 124 (*) 150 - 400 K/uL   CBC     Status: Abnormal   Collection Time   02/24/12  1:47 PM      Component  Value Range Comment   WBC 9.2  4.0 - 10.5 K/uL    RBC 2.84 (*) 4.22 - 5.81 MIL/uL    Hemoglobin 8.6 (*) 13.0 - 17.0 g/dL    HCT 16.1 (*) 09.6 - 52.0 %    MCV 88.4  78.0 - 100.0 fL    MCH 30.3  26.0 - 34.0 pg    MCHC 34.3  30.0 - 36.0 g/dL    RDW 04.5  40.9 - 81.1 %    Platelets 105 (*) 150 - 400 K/uL PLATELET COUNT CONFIRMED BY SMEAR  PROTIME-INR     Status: Normal   Collection Time   02/24/12  5:18 PM      Component Value Range Comment   Prothrombin Time 15.0  11.6 - 15.2 seconds    INR 1.20  0.00 - 1.49   CBC     Status: Abnormal   Collection Time   02/24/12  6:22 PM      Component Value Range Comment   WBC 10.2  4.0 - 10.5 K/uL    RBC 3.18 (*) 4.22 - 5.81 MIL/uL    Hemoglobin 9.6 (*) 13.0 - 17.0 g/dL    HCT 91.4 (*) 78.2 - 52.0 %    MCV 89.0  78.0 -  100.0 fL    MCH 30.2  26.0 - 34.0 pg    MCHC 33.9  30.0 - 36.0 g/dL    RDW 95.6  21.3 - 08.6 %    Platelets 131 (*) 150 - 400 K/uL     Ct Angio Abd/pel W/ And/or W/o  02/24/2012  *RADIOLOGY REPORT*  Clinical Data:  Renal lesion, post percutaneous ablation, with retroperitoneal hemorrhage.  CT ANGIOGRAPHY ABDOMEN AND PELVIS  Technique:  Multidetector CT imaging of the abdomen and pelvis was performed using the standard protocol during bolus administration of intravenous contrast.  Multiplanar reconstructed images including MIPs were obtained and reviewed to evaluate the vascular anatomy.  Contrast: OMNIPAQUE IOHEXOL 350 MG/ML SOLN  Comparison:  02/21/2012  Arterial findings: Previous AVR with patchy coronary calcifications noted. Aorta:                  Mild scattered partially calcified atheromatous plaque.  Mild tortuosity.  No aneurysm, dissection, or stenosis.  Celiac axis:            Ectatic, distal branching unremarkable  Superior mesenteric:Widely patent  Left renal:             Widely patent.  No evidence of pseudoaneurysm, AV fistula, or active extravasation.  Right renal:            Single, widely patent  Inferior mesenteric:Patent  Left iliac:             Common iliac ectatic  2.2 cm diameter extending to its bifurcation.  External and internal iliacs are ectatic, patent.  Right iliac:            Common iliac ectatic distally to 21 mm diameter.  Internal and external iliacs are ectatic, patent.  Venous findings:  Patent hepatic veins, portal vein, superior mesenteric vein, splenic vein, bilateral renal veins, IVC, and iliac venous system.   Review of the MIP images confirms the above findings.  Nonvascular findings: Previous median sternotomy.  Small bilateral pleural effusions, left greater than right, increased since prior study.  Stable hepatic cysts.  Gallbladder physiologically distended.  Spleen and adrenal glands unremarkable.  Interval decrease in size of left perirenal  retroperitoneal hematoma, largest component 5.6  x 10.1 cm transverse dimensions at the level of the lower pole (previously 6.3 x 10.3).  No new components are evident.  There is some layering of blood products in the more inferior components anterior to the psoas musculature, with slightly decreased mass effect.  Ablation defect in the upper pole left kidney is stable.  There are smaller bilateral renal cysts.  No hydronephrosis.  No ascites.  No free air.  Stomach, small bowel, and colon are decompressed. Orthopedic hardware across the right femoral neck.  Mild spondylitic changes in the lumbar spine.  Dependent atelectasis posteriorly in the visualized lung bases.  IMPRESSION:  1.  Slight interval decrease in size of left perinephric/retroperitoneal hematoma. No new components. 2.  No evidence of active extravasation or pseudoaneurysm.   Original Report Authenticated By: D. Andria Rhein, MD     Review of Systems  Constitutional: Negative.   HENT: Negative.   Eyes: Negative.   Respiratory: Negative.   Cardiovascular: Negative.   Gastrointestinal: Negative.  Negative for nausea and vomiting.  Genitourinary: Positive for flank pain. Negative for dysuria, urgency and hematuria.       Flank pain present, but significantly improved  Musculoskeletal: Negative.   Skin: Negative.   Neurological: Negative.   Endo/Heme/Allergies: Bruises/bleeds easily.  Psychiatric/Behavioral: Negative.    Blood pressure 120/65, pulse 76, temperature 98.8 F (37.1 C), temperature source Oral, resp. rate 27, height 6\' 4"  (1.93 m), weight 97.2 kg (214 lb 4.6 oz), SpO2 96.00%. Physical Exam  Constitutional: He is oriented to person, place, and time. He appears well-developed and well-nourished.  HENT:  Head: Normocephalic and atraumatic.  Eyes: EOM are normal. Pupils are equal, round, and reactive to light.  Neck: Normal range of motion. Neck supple.  Cardiovascular: Normal rate.   Respiratory: Effort normal and breath  sounds normal.  GI: Soft. Bowel sounds are normal.       Groin ecchymosis w/o frank hematoma. No flank ecchymosis.  Genitourinary: Penis normal.       Foley c/d/i  Musculoskeletal: Normal range of motion.  Neurological: He is alert and oriented to person, place, and time.  Skin: Skin is warm and dry.  Psychiatric: He has a normal mood and affect. His behavior is normal. Judgment and thought content normal.    Assessment/Plan: 1 -Left Retroperitoneal Hematoma After Cryoablation - No active bleed by CT Angio today, and Hgb now stable. Pain controlled. Agree with slow re-initiation of anticoagulation, hopefully achievieng INR while in house before DC.  If hgb drops sustained again, would strongly recommend renal angiography / attempt embolization. Surgery only as last resort.   Would keep on proph ABX (bactrim or similar) low-dose to prevent seeding of hematoma x 4-6 weeks.  May DC foley at anytime when note needed for UOP monitoring.     Bryant Saye 02/24/2012, 7:05 PM

## 2012-02-24 NOTE — Progress Notes (Signed)
[facility]MT[facility] Attending Daily Note:  Renold Don MD  [phone] pager  Family Practice pager:  [phone] I have seen and examined this patient and have reviewed their chart. I have discussed this patient with the resident. I agree with the resident's findings, assessment and care plan.  Additionally: - Hgb has remained relatively stable since yesterday (8.4 - 8.6 range).  Note increase this AM with further decrease this PM.  However, hematoma has reduced in size somewhat on CT abdomen, making acute bleed much less likely.  Plan to re-initiate Coumadin without Lovenox bridge today as he is such high risk with mechanical valve, with careful following of his INR and trending of his Hgb.

## 2012-02-24 NOTE — Progress Notes (Signed)
Off unit for ct scan.

## 2012-02-24 NOTE — Progress Notes (Signed)
Juan Wilkerson 71 y.o. [id number] S: Juan Wilkerson is in his room sitting in bedside chair. He stomach feels better, he had another BM. O: BP 120/65  Pulse 76  Temp 98.8 F (37.1 C) (Oral)  Resp 27  Ht 6\' 4"  (1.93 m)  Wt 214 lb 4.6 oz (97.2 kg)  BMI 26.08 kg/m2  SpO2 96% GEN: NAD.  CBC    Component Value Date/Time   WBC 9.2 02/24/2012 1347   RBC 2.84* 02/24/2012 1347   HGB 8.6* 02/24/2012 1347   HCT 25.1* 02/24/2012 1347   PLT 105* 02/24/2012 1347   MCV 88.4 02/24/2012 1347   MCH 30.3 02/24/2012 1347   MCHC 34.3 02/24/2012 1347   RDW 14.4 02/24/2012 1347   LYMPHSABS 0.9 02/21/2012 1520   MONOABS 1.8* 02/21/2012 1520   EOSABS 0.1 02/21/2012 1520   BASOSABS 0.0 02/21/2012 1520   Ct Angio Abd/pel W/ And/or W/o  02/24/2012  *RADIOLOGY REPORT*  Clinical Data:  Renal lesion, post percutaneous ablation, with retroperitoneal hemorrhage.  CT ANGIOGRAPHY ABDOMEN AND PELVIS  Technique:  Multidetector CT imaging of the abdomen and pelvis was performed using the standard protocol during bolus administration of intravenous contrast.  Multiplanar reconstructed images including MIPs were obtained and reviewed to evaluate the vascular anatomy.  Contrast: OMNIPAQUE IOHEXOL 350 MG/ML SOLN  Comparison:  02/21/2012  Arterial findings: Previous AVR with patchy coronary calcifications noted. Aorta:                  Mild scattered partially calcified atheromatous plaque.  Mild tortuosity.  No aneurysm, dissection, or stenosis.  Celiac axis:            Ectatic, distal branching unremarkable  Superior mesenteric:Widely patent  Left renal:             Widely patent.  No evidence of pseudoaneurysm, AV fistula, or active extravasation.  Right renal:            Single, widely patent  Inferior mesenteric:Patent  Left iliac:             Common iliac ectatic  2.2 cm diameter extending to its bifurcation.  External and internal iliacs are ectatic, patent.  Right iliac:            Common iliac ectatic distally to 21 mm  diameter.  Internal and external iliacs are ectatic, patent.  Venous findings:  Patent hepatic veins, portal vein, superior mesenteric vein, splenic vein, bilateral renal veins, IVC, and iliac venous system.   Review of the MIP images confirms the above findings.  Nonvascular findings: Previous median sternotomy.  Small bilateral pleural effusions, left greater than right, increased since prior study.  Stable hepatic cysts.  Gallbladder physiologically distended.  Spleen and adrenal glands unremarkable.  Interval decrease in size of left perirenal retroperitoneal hematoma, largest component 5.6 x 10.1 cm transverse dimensions at the level of the lower pole (previously 6.3 x 10.3).  No new components are evident.  There is some layering of blood products in the more inferior components anterior to the psoas musculature, with slightly decreased mass effect.  Ablation defect in the upper pole left kidney is stable.  There are smaller bilateral renal cysts.  No hydronephrosis.  No ascites.  No free air.  Stomach, small bowel, and colon are decompressed. Orthopedic hardware across the right femoral neck.  Mild spondylitic changes in the lumbar spine.  Dependent atelectasis posteriorly in the visualized lung bases.  IMPRESSION:  1.  Slight interval decrease  in size of left perinephric/retroperitoneal hematoma. No new components. 2.  No evidence of active extravasation or pseudoaneurysm.   Original Report Authenticated By: D. Hassell III, MD     A/P: - HGB: 8.4--> transfused-->8.4-->9.3-->8.6 - CBC every 6 hours.  - FOBT stool now.  - Touching base with urology; patient is unable to sustain his HGB level. CTA resulted with hematoma to have no growth since last scan and possibly slightly smaller.  - Cutting patients fluid in half to 50cc/hr to rule possible dilutional affect on HGB, although unlikely.

## 2012-02-24 NOTE — Progress Notes (Signed)
Ok to resume diet per Dr. Gwendolyn Grant

## 2012-02-24 NOTE — Progress Notes (Signed)
Patient ID: Juan Wilkerson, male   DOB: 1940/08/27, 71 y.o.   MRN: [id number]     SUBJECTIVE: Pain in flank mostly gone this morning.  Hgb did not rise after getting 1 unit of blood, stayed at 8.4.  Plan noted for CT abdomen this morning.    . furosemide      . metoprolol succinate  25 mg Oral Daily  . nitroGLYCERIN      . polyethylene glycol  17 g Oral Daily  . sodium chloride  3 mL Intravenous Q12H    Filed Vitals:   02/23/12 2145 02/23/12 2315 02/24/12 0332 02/24/12 0759  BP: 119/63 124/44 110/64 93/71  Pulse:  87 66 77  Temp: 99.8 F (37.7 C) 97.9 F (36.6 C) 98.3 F (36.8 C) 98.7 F (37.1 C)  TempSrc: Oral Oral Oral Oral  Resp: 17 21 12 17   Height:      Weight:  214 lb 4.6 oz (97.2 kg)    SpO2:  93% 96% 96%    Intake/Output Summary (Last 24 hours) at 02/24/12 0841 Last data filed at 02/24/12 0600  Gross per 24 hour  Intake 2752.5 ml  Output   2776 ml  Net  -23.5 ml    LABS: Basic Metabolic Panel:  Basename 02/22/12 0858 02/21/12 1520  NA 139 133*  K 4.4 3.6  CL 104 97  CO2 26 24  GLUCOSE 132* 172*  BUN 18 14  CREATININE 0.96 0.72  CALCIUM 8.8 9.5  MG -- --  PHOS -- --   Liver Function Tests:  Basename 02/21/12 1520  AST 64*  ALT 50  ALKPHOS 92  BILITOT 0.9  PROT 7.0  ALBUMIN 3.7    Basename 02/21/12 1520  LIPASE 17  AMYLASE --   CBC:  Basename 02/24/12 0227 02/23/12 1529 02/21/12 1520  WBC 9.3 11.2* --  NEUTROABS -- -- 13.5*  HGB 8.4* 8.4* --  HCT 24.8* 25.3* --  MCV 88.9 90.0 --  PLT 108* 122* --   Cardiac Enzymes: No results found for this basename: CKTOTAL:3,CKMB:3,CKMBINDEX:3,TROPONINI:3 in the last 72 hours BNP: No components found with this basename: POCBNP:3 D-Dimer: No results found for this basename: DDIMER:2 in the last 72 hours Hemoglobin A1C: No results found for this basename: HGBA1C in the last 72 hours Fasting Lipid Panel: No results found for this basename: CHOL,HDL,LDLCALC,TRIG,CHOLHDL,LDLDIRECT in the last 72  hours Thyroid Function Tests: No results found for this basename: TSH,T4TOTAL,FREET3,T3FREE,THYROIDAB in the last 72 hours Anemia Panel: No results found for this basename: VITAMINB12,FOLATE,FERRITIN,TIBC,IRON,RETICCTPCT in the last 72 hours  RADIOLOGY: Ct Abdomen Pelvis W Contrast  02/21/2012  *RADIOLOGY REPORT*  Clinical Data: Flank pain.  Recent left renal mass ablation.  CT ABDOMEN AND PELVIS WITH CONTRAST  Technique:  Multidetector CT imaging of the abdomen and pelvis was performed following the standard protocol during bolus administration of intravenous contrast.  Contrast: OMNIPAQUE IOHEXOL 300 MG/ML  SOLN  Comparison: CT images dated 12/13 and 11/20/2011  Findings: The patient has a large perirenal and retroperitoneal hemorrhage.  The left kidney is superiorly and anteriorly displaced by the hemorrhage.  There is no obstruction of the kidney.  The left upper pole renal mass that was ablated is visible.  There is also blood extending across the midline adjacent to the duodenum and into the right pericolic gutter.  The hematoma extends into the left side of the pelvis and slightly compresses the left side of the bladder.  There is also a small amount of blood  in the  left upper quadrant around the spleen.  Foley catheter in the bladder.  Delayed imaging demonstrates normal excretion of contrast from both kidneys.  Incidental note is made of multiple small cysts in the liver as well as small cysts in both kidneys.  Pancreas and spleen and adrenal glands are normal.  The bowel is normal except for being displaced by the large hematoma.  No acute osseous abnormality.  IMPRESSION:    Large retroperitoneal hemorrhage originating from the upper pole of the left kidney at the site of tumor ablation.  Secondary mass effect upon the kidney and adjacent bowel and bladder.  Critical Value/emergent results were called by telephone at the time of interpretation on 02/21/2012 at  7:21 p.m. to  Dr. Linwood Dibbles  and to Dr. Malachy Moan,, who verbally acknowledged these results.   Original Report Authenticated By: Francene Boyers, M.D.    Ct Guide Tissue Ablation  02/19/2012  *RADIOLOGY REPORT*  Clinical Data:  2.5 cm posterior left renal mass.  The patient presents for biopsy and cryoablation.  CT-GUIDED CORE BIOPSY OF LEFT RENAL MASS. CT-GUIDED PERCUTANEOUS CRYOABLATION OF LEFT RENAL MASS.  Anesthesia:  General  Medications:  2 grams IV Ancef. As antibiotic prophylaxis, Ancef was ordered pre-procedure and administered intravenously within one hour of incision.  Procedure:  The procedure, risks, benefits, and alternatives were explained to the patient.  Questions regarding the procedure were encouraged and answered.  The patient understands and consents to the procedure.  The patient was placed under general anesthesia.  Initial unenhanced CT was performed in a prone position to localize the left renal mass.  The left flank region was prepped with Betadine in a sterile fashion, and a sterile drape was applied covering the operative field.  A sterile gown and sterile gloves were used for the procedure.  A 17 gauge trocar needle was advanced into the left renal mass. Core biopsy was performed with an 18 gauge automated core biopsy device.  A total of two core samples were submitted in formalin for pathologic analysis.  Under CT guidance, a total of three Galil Ice Rod Plus percutaneous cryoablation probes were advanced into the left renal mass.  Probe positioning was confirmed by CT prior to cryoablation. Prior to ablation, a 22 gauge spinal needle was advanced between the kidney and the spleen and a total of approximately 90 ml of sterile saline injected for hydrodissection.  Cryoablation was performed through the three probes simultaneously. Initial 10 minute cycle of cryoablation was performed.  This was followed by a 8 minute thaw cycle.  A second 10 minute cycle of cryoablation was then performed.  During ablation,  periodic CT imaging was performed to monitor ice ball formation and morphology. After active thaw, the cryoablation probes were removed.  Post-procedural CT was performed.  Complications: None  Findings:  The exophytic mass emanating from the posterior aspect of the upper to mid kidney was well localized by unenhanced CT. Due to proximity of the spleen, hydrodissection was performed prior to treatment.  Biopsy was performed of the lesion prior to cryoablation.  Imaging during treatment shows adequate ice ball formation encompassing the lesion.  There were no immediate bleeding complications.  IMPRESSION:  CT guided percutaneous core biopsy and cryoablation of a left renal mass.  The patient will be observed overnight.  Initial follow-up will be performed in approximately 4 weeks.   Original Report Authenticated By: Irish Lack, M.D.    Ct Biopsy  02/19/2012  *RADIOLOGY REPORT*  Clinical Data:  2.5 cm posterior left renal mass.  The patient presents for biopsy and cryoablation.  CT-GUIDED CORE BIOPSY OF LEFT RENAL MASS. CT-GUIDED PERCUTANEOUS CRYOABLATION OF LEFT RENAL MASS.  Anesthesia:  General  Medications:  2 grams IV Ancef. As antibiotic prophylaxis, Ancef was ordered pre-procedure and administered intravenously within one hour of incision.  Procedure:  The procedure, risks, benefits, and alternatives were explained to the patient.  Questions regarding the procedure were encouraged and answered.  The patient understands and consents to the procedure.  The patient was placed under general anesthesia.  Initial unenhanced CT was performed in a prone position to localize the left renal mass.  The left flank region was prepped with Betadine in a sterile fashion, and a sterile drape was applied covering the operative field.  A sterile gown and sterile gloves were used for the procedure.  A 17 gauge trocar needle was advanced into the left renal mass. Core biopsy was performed with an 18 gauge automated core  biopsy device.  A total of two core samples were submitted in formalin for pathologic analysis.  Under CT guidance, a total of three Galil Ice Rod Plus percutaneous cryoablation probes were advanced into the left renal mass.  Probe positioning was confirmed by CT prior to cryoablation. Prior to ablation, a 22 gauge spinal needle was advanced between the kidney and the spleen and a total of approximately 90 ml of sterile saline injected for hydrodissection.  Cryoablation was performed through the three probes simultaneously. Initial 10 minute cycle of cryoablation was performed.  This was followed by a 8 minute thaw cycle.  A second 10 minute cycle of cryoablation was then performed.  During ablation, periodic CT imaging was performed to monitor ice ball formation and morphology. After active thaw, the cryoablation probes were removed.  Post-procedural CT was performed.  Complications: None  Findings:  The exophytic mass emanating from the posterior aspect of the upper to mid kidney was well localized by unenhanced CT. Due to proximity of the spleen, hydrodissection was performed prior to treatment.  Biopsy was performed of the lesion prior to cryoablation.  Imaging during treatment shows adequate ice ball formation encompassing the lesion.  There were no immediate bleeding complications.  IMPRESSION:  CT guided percutaneous core biopsy and cryoablation of a left renal mass.  The patient will be observed overnight.  Initial follow-up will be performed in approximately 4 weeks.   Original Report Authenticated By: Irish Lack, M.D.     PHYSICAL EXAM General: NAD Neck: No JVD, no thyromegaly or thyroid nodule.  Lungs: Clear to auscultation bilaterally with normal respiratory effort. CV: Nondisplaced PMI.  Heart mildly tachy, irregular S1/S2, no S3/S4, mechanical S2, no murmur.  No peripheral edema.  No carotid bruit.  Normal pedal pulses.  Abdomen: Soft, nontender, no hepatosplenomegaly, no distention.    Neurologic: Alert and oriented x 3.  Psych: Normal affect. Extremities: No clubbing or cyanosis.   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   1. Atrial flutter: HR elevated, can use Toprol XL 50 mg daily.   2. Mechanical aortic valve:  Needs anticoagulation but has had retroperitoneal bleed.  Hemoglobin dropped again yesterday and did not rise appreciably after 1 unit PRBCs.  If hematoma appears stable to decreased on CT, would resume coumadin  without bridging Lovenox today.   Marca Ancona 02/24/2012 8:41 AM

## 2012-02-24 NOTE — Progress Notes (Signed)
Subjective: Pt resting after CT. Denies much pain at all. No N/V  Objective: Physical Exam: BP 118/60  Pulse 79  Temp 98.8 F (37.1 C) (Oral)  Resp 23  Ht 6\' 4"  (1.93 m)  Wt 214 lb 4.6 oz (97.2 kg)  BMI 26.08 kg/m2  SpO2 96% Abdomen: soft, NT, ND, no CVAT    Labs: CBC  Basename 02/24/12 0849 02/24/12 0227  WBC 10.8* 9.3  HGB 9.3* 8.4*  HCT 27.3* 24.8*  PLT 124* 108*   BMET  Basename 02/22/12 0858 02/21/12 1520  NA 139 133*  K 4.4 3.6  CL 104 97  CO2 26 24  GLUCOSE 132* 172*  BUN 18 14  CREATININE 0.96 0.72  CALCIUM 8.8 9.5   LFT  Basename 02/21/12 1520  PROT 7.0  ALBUMIN 3.7  AST 64*  ALT 50  ALKPHOS 92  BILITOT 0.9  BILIDIR --  IBILI --  LIPASE 17   PT/INR  Basename 02/21/12 1613  LABPROT 14.3  INR 1.13     Studies/Results: Ct Angio Abd/pel W/ And/or W/o  02/24/2012  *RADIOLOGY REPORT*  Clinical Data:  Renal lesion, post percutaneous ablation, with retroperitoneal hemorrhage.  CT ANGIOGRAPHY ABDOMEN AND PELVIS  Technique:  Multidetector CT imaging of the abdomen and pelvis was performed using the standard protocol during bolus administration of intravenous contrast.  Multiplanar reconstructed images including MIPs were obtained and reviewed to evaluate the vascular anatomy.  Contrast: OMNIPAQUE IOHEXOL 350 MG/ML SOLN  Comparison:  02/21/2012  Arterial findings: Previous AVR with patchy coronary calcifications noted. Aorta:                  Mild scattered partially calcified atheromatous plaque.  Mild tortuosity.  No aneurysm, dissection, or stenosis.  Celiac axis:            Ectatic, distal branching unremarkable  Superior mesenteric:Widely patent  Left renal:             Widely patent.  No evidence of pseudoaneurysm, AV fistula, or active extravasation.  Right renal:            Single, widely patent  Inferior mesenteric:Patent  Left iliac:             Common iliac ectatic  2.2 cm diameter extending to its bifurcation.  External and internal  iliacs are ectatic, patent.  Right iliac:            Common iliac ectatic distally to 21 mm diameter.  Internal and external iliacs are ectatic, patent.  Venous findings:  Patent hepatic veins, portal vein, superior mesenteric vein, splenic vein, bilateral renal veins, IVC, and iliac venous system.   Review of the MIP images confirms the above findings.  Nonvascular findings: Previous median sternotomy.  Small bilateral pleural effusions, left greater than right, increased since prior study.  Stable hepatic cysts.  Gallbladder physiologically distended.  Spleen and adrenal glands unremarkable.  Interval decrease in size of left perirenal retroperitoneal hematoma, largest component 5.6 x 10.1 cm transverse dimensions at the level of the lower pole (previously 6.3 x 10.3).  No new components are evident.  There is some layering of blood products in the more inferior components anterior to the psoas musculature, with slightly decreased mass effect.  Ablation defect in the upper pole left kidney is stable.  There are smaller bilateral renal cysts.  No hydronephrosis.  No ascites.  No free air.  Stomach, small bowel, and colon are decompressed. Orthopedic hardware across the right femoral  neck.  Mild spondylitic changes in the lumbar spine.  Dependent atelectasis posteriorly in the visualized lung bases.  IMPRESSION:  1.  Slight interval decrease in size of left perinephric/retroperitoneal hematoma. No new components. 2.  No evidence of active extravasation or pseudoaneurysm.   Original Report Authenticated By: D. Andria Rhein, MD     Assessment/Plan: Transfused 1 unit PRBC and after repeat CBC, Hgb up to 9.3 Pt feels ok, BP/HR have remained stable CT scan reviewed with Dr. Lowella Dandy, feels hematoma unchanged, perhaps slightly smaller. Do not think active bleeding. Ok with beginning Coumadin Will cont to follow along/labs.    LOS: 3 days    Brayton El PA-C 02/24/2012 1:25 PM

## 2012-02-24 NOTE — Progress Notes (Signed)
Patient ID: Juan Wilkerson, male   DOB: 07-29-1940, 71 y.o.   MRN: [id number] [facility] Medicine Teaching Service PGY-1 Progress Note   Overnight Events Patient is doing well, sitting up eating breakfast. Complains of only mild left flank pain with "twisting". Has not had a BM since Sunday.  Objective: Temp:  [97.9 F (36.6 C)-99.8 F (37.7 C)] 98.7 F (37.1 C) (12/18 0759) Pulse Rate:  [64-102] 77  (12/18 0759) Cardiac Rhythm:  [-] Atrial fibrillation (12/17 2238) Resp:  [11-24] 17  (12/18 0759) BP: (93-124)/(42-79) 93/71 mmHg (12/18 0759) SpO2:  [93 %-96 %] 96 % (12/18 0759) Weight:  [214 lb 4.6 oz (97.2 kg)] 214 lb 4.6 oz (97.2 kg) (12/17 2315) Weight change:   Physical Exam: Gen: NAD.  CV: irregularly irregular.  HR 85-95 Lungs: CTAB. No wheezing, rhonchi or rales.  Abd: Mildly distended abdomen. Pain RLQ and over ribs (right). EXT: NT. No erythema. No edema.   CBC    Component Value Date/Time   WBC 9.3 02/24/2012 0227   RBC 2.79* 02/24/2012 0227   HGB 8.4* 02/24/2012 0227   HCT 24.8* 02/24/2012 0227   PLT 108* 02/24/2012 0227   MCV 88.9 02/24/2012 0227   MCH 30.1 02/24/2012 0227   MCHC 33.9 02/24/2012 0227   RDW 14.5 02/24/2012 0227   LYMPHSABS 0.9 02/21/2012 1520   MONOABS 1.8* 02/21/2012 1520   EOSABS 0.1 02/21/2012 1520   BASOSABS 0.0 02/21/2012 1520   Assessment and Plan: 71 yo male with history of A. 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   Retroperitoneal bleed: Was on coumadin for a.fib but transitioned to lovenox for recent ablation procedure. Had procedure on 12/13 and now found to have retroperitoneal bleed on CT.  - Initial Hgb in ED was 13.1-->10.8-->10.4--> 9.0--> after 1 unit transfusion--> 8.4--> stat repeat this am pending - Will continue to monitor HGB.  - Urology consult today, probable surgical consult. - CTA abdomen and pelvis ordered  today; renal Atrial fibrillation: Currently in a. Fib with RVR. Placed on diltiazem drip in ED. Cardiology consult done in ED, appreciate input.  - Start po diltiazem 240 mg daily (1000)--> DC'd to home dose metoprolol Mechanical aortic valve: Needs anticoagulation but has had retroperitoneal bleed. Hemoglobin dropped again today, and flank pain has recurred. - When stable or only mildly reduced, will resume coumadin without bridging Lovenox today. Cards recommends starting coumadin if today's CBC is stable, d/t the 3-5 day interval time to therapeutic level. HTN:  - Held amlodipine for now.  Renal mass: S/p core biopsy and cryo ablation on 12/13.  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  FEN/GI: NS @ 127mL/hr, Ice chips only for now  PPx: SCD's  Dispo: Pending further work up and improvement

## 2012-02-25 ENCOUNTER — Other Ambulatory Visit: Payer: Self-pay | Admitting: Family Medicine

## 2012-02-25 LAB — CBC
HCT: 28.4 % — ABNORMAL LOW (ref 39.0–52.0)
Hemoglobin: 8.6 g/dL — ABNORMAL LOW (ref 13.0–17.0)
MCH: 30.5 pg (ref 26.0–34.0)
MCH: 30.5 pg (ref 26.0–34.0)
MCHC: 34.7 g/dL (ref 30.0–36.0)
MCHC: 34.2 g/dL (ref 30.0–36.0)
MCHC: 34.4 g/dL (ref 30.0–36.0)
Platelets: 144 10*3/uL — ABNORMAL LOW (ref 150–400)
Platelets: 149 10*3/uL — ABNORMAL LOW (ref 150–400)
RBC: 3.28 MIL/uL — ABNORMAL LOW (ref 4.22–5.81)
RDW: 14.2 % (ref 11.5–15.5)
RDW: 14 % (ref 11.5–15.5)

## 2012-02-25 LAB — HEPARIN LEVEL (UNFRACTIONATED): Heparin Unfractionated: 0.13 IU/mL — ABNORMAL LOW (ref 0.30–0.70)

## 2012-02-25 MED ORDER — HEPARIN (PORCINE) IN NACL 100-0.45 UNIT/ML-% IJ SOLN
1350.0000 [IU]/h | INTRAMUSCULAR | Status: DC
  Administered 2012-02-25: 1350 [IU]/h via INTRAVENOUS
  Filled 2012-02-25 (×2): qty 250

## 2012-02-25 MED ORDER — HEPARIN (PORCINE) IN NACL 100-0.45 UNIT/ML-% IJ SOLN
2300.0000 [IU]/h | INTRAMUSCULAR | Status: DC
  Administered 2012-02-26: 2000 [IU]/h via INTRAVENOUS
  Administered 2012-02-26: 1650 [IU]/h via INTRAVENOUS
  Administered 2012-02-27 (×2): 2100 [IU]/h via INTRAVENOUS
  Administered 2012-02-28 – 2012-03-03 (×5): 2300 [IU]/h via INTRAVENOUS
  Filled 2012-02-25 (×19): qty 250

## 2012-02-25 NOTE — Progress Notes (Signed)
ANTICOAGULATION CONSULT NOTE - Follow Up Consult  Pharmacy Consult for Heparin Indication: Mechanical Aortic Valve, Atrial Fibrillation  No Known Allergies  Patient Measurements: Height: 6\' 4"  (193 cm) Weight: 213 lb 13.5 oz (97 kg) IBW/kg (Calculated) : 86.8  Heparin Dosing Weight: 97 kg  Vital Signs: Temp: 98.8 F (37.1 C) (12/19 1546) Temp src: Oral (12/19 1546) BP: 134/65 mmHg (12/19 1546) Pulse Rate: 64  (12/19 1546)  Labs:  Basename 02/25/12 1830 02/25/12 0833 02/25/12 0059 02/24/12 1718  HGB 10.0* 9.7* -- --  HCT 29.1* 28.4* 24.8* --  PLT 149* 144* 120* --  APTT -- -- -- --  LABPROT -- 15.3* -- 15.0  INR -- 1.23 -- 1.20  HEPARINUNFRC 0.13* -- -- --  CREATININE -- -- -- --  CKTOTAL -- -- -- --  CKMB -- -- -- --  TROPONINI -- -- -- --   Estimated Creatinine Clearance: 85.4 ml/min (by C-G formula based on Cr of 0.96).  Medications:  Infusions:    . sodium chloride 50 mL/hr at 02/24/12 1930  . heparin 1,350 Units/hr (02/25/12 0933)   Assessment: 71 YOM admitted 12/15 with retroperitoneal bleed after renal cryoablation procedure 12/13. He is chronically anticoagulated with Coumadin for his mechanical valve and atrial fibrillation, and was being bridged with Lovenox around his procedure. Coumadin and Lovenox were held on admission, and he received 1 dose of Coumadin 12/18. Coumadin has now been discontinued and Heparin bridging is requested.   The first 8 hr heparin level is sub-therapeutic at 0.13. H/H is low but stable this pm. Platelets are slightly better. Per RN in report, no overt bleeding noted. No problems with lines infusing.   Goal of Therapy:  Heparin level 0.3-0.7 units/ml Monitor platelets by anticoagulation protocol: Yes   Plan:  Increase heparin to 1650 units/hr (increase of ~3 units/kg/hr). Heparin level in 8 hours.  Follow-up q8h CBCs with medical team.  Daily heparin level and CBC.   Fayne Norrie 02/25/2012,7:30 PM

## 2012-02-25 NOTE — Progress Notes (Signed)
Patient ID: Juan Wilkerson, male   DOB: 1940/07/13, 71 y.o.   MRN: [id number]    Subjective:  Notes and imaging studies reviewed.  Pt with mechanical heart valve and perinephric hematoma s/p percutaneous cryoablation of renal mass. He has been off anticoagulation. Pt apparently started on Coumadin last night.  Objective: Vital signs in last 24 hours: Temp:  [97.5 F (36.4 C)-98.8 F (37.1 C)] 98 F (36.7 C) (12/18 2323) Pulse Rate:  [66-84] 75  (12/19 0331) Resp:  [16-30] 17  (12/19 0331) BP: (93-131)/(55-71) 118/63 mmHg (12/19 0331) SpO2:  [91 %-96 %] 91 % (12/18 2323) Weight:  [97 kg (213 lb 13.5 oz)] 97 kg (213 lb 13.5 oz) (12/18 2323)  Intake/Output from previous day: 12/18 0701 - 12/19 0700 In: 1573.3 [P.O.:360; I.V.:1213.3] Out: 1702 [Urine:1700; Stool:2] Intake/Output this shift: Total I/O In: 200 [I.V.:200] Out: 1702 [Urine:1700; Stool:2]   Lab Results:  Basename 02/25/12 0059 02/24/12 1822 02/24/12 1347  HGB 8.6* 9.6* 8.6*  HCT 24.8* 28.3* 25.1*   BMET  Basename 02/22/12 0858  NA 139  K 4.4  CL 104  CO2 26  GLUCOSE 132*  BUN 18  CREATININE 0.96  CALCIUM 8.8     Studies/Results: Ct Angio Abd/pel W/ And/or W/o  02/24/2012  *RADIOLOGY REPORT*  Clinical Data:  Renal lesion, post percutaneous ablation, with retroperitoneal hemorrhage.  CT ANGIOGRAPHY ABDOMEN AND PELVIS  Technique:  Multidetector CT imaging of the abdomen and pelvis was performed using the standard protocol during bolus administration of intravenous contrast.  Multiplanar reconstructed images including MIPs were obtained and reviewed to evaluate the vascular anatomy.  Contrast: OMNIPAQUE IOHEXOL 350 MG/ML SOLN  Comparison:  02/21/2012  Arterial findings: Previous AVR with patchy coronary calcifications noted. Aorta:                  Mild scattered partially calcified atheromatous plaque.  Mild tortuosity.  No aneurysm, dissection, or stenosis.  Celiac axis:            Ectatic, distal branching  unremarkable  Superior mesenteric:Widely patent  Left renal:             Widely patent.  No evidence of pseudoaneurysm, AV fistula, or active extravasation.  Right renal:            Single, widely patent  Inferior mesenteric:Patent  Left iliac:             Common iliac ectatic  2.2 cm diameter extending to its bifurcation.  External and internal iliacs are ectatic, patent.  Right iliac:            Common iliac ectatic distally to 21 mm diameter.  Internal and external iliacs are ectatic, patent.  Venous findings:  Patent hepatic veins, portal vein, superior mesenteric vein, splenic vein, bilateral renal veins, IVC, and iliac venous system.   Review of the MIP images confirms the above findings.  Nonvascular findings: Previous median sternotomy.  Small bilateral pleural effusions, left greater than right, increased since prior study.  Stable hepatic cysts.  Gallbladder physiologically distended.  Spleen and adrenal glands unremarkable.  Interval decrease in size of left perirenal retroperitoneal hematoma, largest component 5.6 x 10.1 cm transverse dimensions at the level of the lower pole (previously 6.3 x 10.3).  No new components are evident.  There is some layering of blood products in the more inferior components anterior to the psoas musculature, with slightly decreased mass effect.  Ablation defect in the upper pole left kidney is  stable.  There are smaller bilateral renal cysts.  No hydronephrosis.  No ascites.  No free air.  Stomach, small bowel, and colon are decompressed. Orthopedic hardware across the right femoral neck.  Mild spondylitic changes in the lumbar spine.  Dependent atelectasis posteriorly in the visualized lung bases.  IMPRESSION:  1.  Slight interval decrease in size of left perinephric/retroperitoneal hematoma. No new components. 2.  No evidence of active extravasation or pseudoaneurysm.   Original Report Authenticated By: D. Andria Rhein, MD     Assessment/Plan: Perinephric hematoma s/p  percutaneous cryoablation of renal mass  - I agree with anticoagulation considering considerable risks with mechanical heart valve if not anticoagulated.  However, I would not recommend Coumadin at this point but rather would recommend treatment with Lovenox or Heparin and close monitoring so that if patient bleeds again, anticoagulation can potentially be quickly reversed and he can undergo intervention if needed.  If patient remains stable on anticoagulation for a few days, he may then be able to be discharged on Lovenox with plans to restart oral Coumadin at a later date. Will continue to follow.   LOS: 4 days   Trella Thurmond,LES 02/25/2012, 6:32 AM

## 2012-02-25 NOTE — Progress Notes (Signed)
Patient ID: Juan Wilkerson, male   DOB: 1940-10-08, 71 y.o.   MRN: [id number]    SUBJECTIVE: No flank pain.  HR controlled.  CT abdomen yesterday, hematoma stable to decreased in size.     Filed Vitals:   02/24/12 2205 02/24/12 2323 02/25/12 0331 02/25/12 0819  BP: 131/60 120/61 118/63 113/70  Pulse: 72 66 75 82  Temp:  98 F (36.7 C)  99 F (37.2 C)  TempSrc:  Oral  Oral  Resp: 26 30 17 19   Height:      Weight:  213 lb 13.5 oz (97 kg)    SpO2: 93% 91%  97%    Intake/Output Summary (Last 24 hours) at 02/25/12 0823 Last data filed at 02/25/12 0700  Gross per 24 hour  Intake 1873.33 ml  Output   1702 ml  Net 171.33 ml    LABS: Basic Metabolic Panel:  Basename 02/22/12 0858  NA 139  K 4.4  CL 104  CO2 26  GLUCOSE 132*  BUN 18  CREATININE 0.96  CALCIUM 8.8  MG --  PHOS --   Liver Function Tests: No results found for this basename: AST:2,ALT:2,ALKPHOS:2,BILITOT:2,PROT:2,ALBUMIN:2 in the last 72 hours No results found for this basename: LIPASE:2,AMYLASE:2 in the last 72 hours CBC:  Basename 02/25/12 0059 02/24/12 1822  WBC 8.5 10.2  NEUTROABS -- --  HGB 8.6* 9.6*  HCT 24.8* 28.3*  MCV 87.9 89.0  PLT 120* 131*   Cardiac Enzymes: No results found for this basename: CKTOTAL:3,CKMB:3,CKMBINDEX:3,TROPONINI:3 in the last 72 hours BNP: No components found with this basename: POCBNP:3 D-Dimer: No results found for this basename: DDIMER:2 in the last 72 hours Hemoglobin A1C: No results found for this basename: HGBA1C in the last 72 hours Fasting Lipid Panel: No results found for this basename: CHOL,HDL,LDLCALC,TRIG,CHOLHDL,LDLDIRECT in the last 72 hours Thyroid Function Tests: No results found for this basename: TSH,T4TOTAL,FREET3,T3FREE,THYROIDAB in the last 72 hours Anemia Panel: No results found for this basename: VITAMINB12,FOLATE,FERRITIN,TIBC,IRON,RETICCTPCT in the last 72 hours  RADIOLOGY: Ct Abdomen Pelvis W Contrast  02/21/2012  *RADIOLOGY REPORT*   Clinical Data: Flank pain.  Recent left renal mass ablation.  CT ABDOMEN AND PELVIS WITH CONTRAST  Technique:  Multidetector CT imaging of the abdomen and pelvis was performed following the standard protocol during bolus administration of intravenous contrast.  Contrast: OMNIPAQUE IOHEXOL 300 MG/ML  SOLN  Comparison: CT images dated 12/13 and 11/20/2011  Findings: The patient has a large perirenal and retroperitoneal hemorrhage.  The left kidney is superiorly and anteriorly displaced by the hemorrhage.  There is no obstruction of the kidney.  The left upper pole renal mass that was ablated is visible.  There is also blood extending across the midline adjacent to the duodenum and into the right pericolic gutter.  The hematoma extends into the left side of the pelvis and slightly compresses the left side of the bladder.  There is also a small amount of blood in the  left upper quadrant around the spleen.  Foley catheter in the bladder.  Delayed imaging demonstrates normal excretion of contrast from both kidneys.  Incidental note is made of multiple small cysts in the liver as well as small cysts in both kidneys.  Pancreas and spleen and adrenal glands are normal.  The bowel is normal except for being displaced by the large hematoma.  No acute osseous abnormality.  IMPRESSION:    Large retroperitoneal hemorrhage originating from the upper pole of the left kidney at the site of tumor  ablation.  Secondary mass effect upon the kidney and adjacent bowel and bladder.  Critical Value/emergent results were called by telephone at the time of interpretation on 02/21/2012 at  7:21 p.m. to  Dr. Linwood Dibbles and to Dr. Malachy Moan,, who verbally acknowledged these results.   Original Report Authenticated By: Francene Boyers, M.D.    Ct Guide Tissue Ablation  02/19/2012  *RADIOLOGY REPORT*  Clinical Data:  2.5 cm posterior left renal mass.  The patient presents for biopsy and cryoablation.  CT-GUIDED CORE BIOPSY OF LEFT  RENAL MASS. CT-GUIDED PERCUTANEOUS CRYOABLATION OF LEFT RENAL MASS.  Anesthesia:  General  Medications:  2 grams IV Ancef. As antibiotic prophylaxis, Ancef was ordered pre-procedure and administered intravenously within one hour of incision.  Procedure:  The procedure, risks, benefits, and alternatives were explained to the patient.  Questions regarding the procedure were encouraged and answered.  The patient understands and consents to the procedure.  The patient was placed under general anesthesia.  Initial unenhanced CT was performed in a prone position to localize the left renal mass.  The left flank region was prepped with Betadine in a sterile fashion, and a sterile drape was applied covering the operative field.  A sterile gown and sterile gloves were used for the procedure.  A 17 gauge trocar needle was advanced into the left renal mass. Core biopsy was performed with an 18 gauge automated core biopsy device.  A total of two core samples were submitted in formalin for pathologic analysis.  Under CT guidance, a total of three Galil Ice Rod Plus percutaneous cryoablation probes were advanced into the left renal mass.  Probe positioning was confirmed by CT prior to cryoablation. Prior to ablation, a 22 gauge spinal needle was advanced between the kidney and the spleen and a total of approximately 90 ml of sterile saline injected for hydrodissection.  Cryoablation was performed through the three probes simultaneously. Initial 10 minute cycle of cryoablation was performed.  This was followed by a 8 minute thaw cycle.  A second 10 minute cycle of cryoablation was then performed.  During ablation, periodic CT imaging was performed to monitor ice ball formation and morphology. After active thaw, the cryoablation probes were removed.  Post-procedural CT was performed.  Complications: None  Findings:  The exophytic mass emanating from the posterior aspect of the upper to mid kidney was well localized by unenhanced CT.  Due to proximity of the spleen, hydrodissection was performed prior to treatment.  Biopsy was performed of the lesion prior to cryoablation.  Imaging during treatment shows adequate ice ball formation encompassing the lesion.  There were no immediate bleeding complications.  IMPRESSION:  CT guided percutaneous core biopsy and cryoablation of a left renal mass.  The patient will be observed overnight.  Initial follow-up will be performed in approximately 4 weeks.   Original Report Authenticated By: Irish Lack, M.D.    Ct Biopsy  02/19/2012  *RADIOLOGY REPORT*  Clinical Data:  2.5 cm posterior left renal mass.  The patient presents for biopsy and cryoablation.  CT-GUIDED CORE BIOPSY OF LEFT RENAL MASS. CT-GUIDED PERCUTANEOUS CRYOABLATION OF LEFT RENAL MASS.  Anesthesia:  General  Medications:  2 grams IV Ancef. As antibiotic prophylaxis, Ancef was ordered pre-procedure and administered intravenously within one hour of incision.  Procedure:  The procedure, risks, benefits, and alternatives were explained to the patient.  Questions regarding the procedure were encouraged and answered.  The patient understands and consents to the procedure.  The patient was placed  under general anesthesia.  Initial unenhanced CT was performed in a prone position to localize the left renal mass.  The left flank region was prepped with Betadine in a sterile fashion, and a sterile drape was applied covering the operative field.  A sterile gown and sterile gloves were used for the procedure.  A 17 gauge trocar needle was advanced into the left renal mass. Core biopsy was performed with an 18 gauge automated core biopsy device.  A total of two core samples were submitted in formalin for pathologic analysis.  Under CT guidance, a total of three Galil Ice Rod Plus percutaneous cryoablation probes were advanced into the left renal mass.  Probe positioning was confirmed by CT prior to cryoablation. Prior to ablation, a 22 gauge spinal  needle was advanced between the kidney and the spleen and a total of approximately 90 ml of sterile saline injected for hydrodissection.  Cryoablation was performed through the three probes simultaneously. Initial 10 minute cycle of cryoablation was performed.  This was followed by a 8 minute thaw cycle.  A second 10 minute cycle of cryoablation was then performed.  During ablation, periodic CT imaging was performed to monitor ice ball formation and morphology. After active thaw, the cryoablation probes were removed.  Post-procedural CT was performed.  Complications: None  Findings:  The exophytic mass emanating from the posterior aspect of the upper to mid kidney was well localized by unenhanced CT. Due to proximity of the spleen, hydrodissection was performed prior to treatment.  Biopsy was performed of the lesion prior to cryoablation.  Imaging during treatment shows adequate ice ball formation encompassing the lesion.  There were no immediate bleeding complications.  IMPRESSION:  CT guided percutaneous core biopsy and cryoablation of a left renal mass.  The patient will be observed overnight.  Initial follow-up will be performed in approximately 4 weeks.   Original Report Authenticated By: Irish Lack, M.D.    Ct Angio Abd/pel W/ And/or W/o  02/24/2012  *RADIOLOGY REPORT*  Clinical Data:  Renal lesion, post percutaneous ablation, with retroperitoneal hemorrhage.  CT ANGIOGRAPHY ABDOMEN AND PELVIS  Technique:  Multidetector CT imaging of the abdomen and pelvis was performed using the standard protocol during bolus administration of intravenous contrast.  Multiplanar reconstructed images including MIPs were obtained and reviewed to evaluate the vascular anatomy.  Contrast: OMNIPAQUE IOHEXOL 350 MG/ML SOLN  Comparison:  02/21/2012  Arterial findings: Previous AVR with patchy coronary calcifications noted. Aorta:                  Mild scattered partially calcified atheromatous plaque.  Mild tortuosity.   No aneurysm, dissection, or stenosis.  Celiac axis:            Ectatic, distal branching unremarkable  Superior mesenteric:Widely patent  Left renal:             Widely patent.  No evidence of pseudoaneurysm, AV fistula, or active extravasation.  Right renal:            Single, widely patent  Inferior mesenteric:Patent  Left iliac:             Common iliac ectatic  2.2 cm diameter extending to its bifurcation.  External and internal iliacs are ectatic, patent.  Right iliac:            Common iliac ectatic distally to 21 mm diameter.  Internal and external iliacs are ectatic, patent.  Venous findings:  Patent hepatic veins, portal vein, superior mesenteric vein,  splenic vein, bilateral renal veins, IVC, and iliac venous system.   Review of the MIP images confirms the above findings.  Nonvascular findings: Previous median sternotomy.  Small bilateral pleural effusions, left greater than right, increased since prior study.  Stable hepatic cysts.  Gallbladder physiologically distended.  Spleen and adrenal glands unremarkable.  Interval decrease in size of left perirenal retroperitoneal hematoma, largest component 5.6 x 10.1 cm transverse dimensions at the level of the lower pole (previously 6.3 x 10.3).  No new components are evident.  There is some layering of blood products in the more inferior components anterior to the psoas musculature, with slightly decreased mass effect.  Ablation defect in the upper pole left kidney is stable.  There are smaller bilateral renal cysts.  No hydronephrosis.  No ascites.  No free air.  Stomach, small bowel, and colon are decompressed. Orthopedic hardware across the right femoral neck.  Mild spondylitic changes in the lumbar spine.  Dependent atelectasis posteriorly in the visualized lung bases.  IMPRESSION:  1.  Slight interval decrease in size of left perinephric/retroperitoneal hematoma. No new components. 2.  No evidence of active extravasation or pseudoaneurysm.   Original  Report Authenticated By: D. Andria Rhein, MD     PHYSICAL EXAM  General: NAD  Neck: No JVD, no thyromegaly or thyroid nodule.  Lungs: Clear to auscultation bilaterally with normal respiratory effort.  CV: Nondisplaced PMI. Heart mildly tachy, irregular S1/S2, no S3/S4, mechanical S2, no murmur. No peripheral edema. No carotid bruit. Normal pedal pulses.  Abdomen: Soft, nontender, no hepatosplenomegaly, no distention.  Neurologic: Alert and oriented x 3.  Psych: Normal affect.  Extremities: No clubbing or cyanosis.   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  1. Atrial flutter: HR stable, continue Toprol XL.   2. Mechanical aortic valve: Needs anticoagulation but has had retroperitoneal bleed. Hematoma stable on CT.  Discussed situation with urology (Dr. Laverle Patter).  For now, will start IV heparin without a bolus.  If he rebleeds, this can be quickly stopped and he can undergo renal angiography/embolization.  If he remains stable, will restart coumadin.   Marca Ancona 02/25/2012 8:26 AM

## 2012-02-25 NOTE — Progress Notes (Signed)
PCP [facility] VISIT NOTE  Juan Wilkerson is my primary patient.  I greatly appreciate the excellent care being provided by the FPTS as well as by Cardiology, Urology, and IR.  He seems to be in good spirits this morning and is overall feeling well.  His flank pain has improved and still has some mid to lower abdominal pain.  He is hungry for his breakfast.    I agree with heparin for now to monitor for re-bleed prior to starting daily Coumadin.  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  FPTS team- please give Juan Wilkerson and Rx for Coumadin at discharge with his current dose at that time and schedule him for both close INR and PCP follow up.  Please let me know if I can be of further assistance.  Juan Wilkerson 02/25/2012, 8:56 AM

## 2012-02-25 NOTE — Progress Notes (Signed)
Subjective: Left renal lesion cryoablation 12/14 DC'd from [facility] 12/14 Restarted Lovenox before dc; restarted coumadin 12/15 Returned to ER 12/15 with retroperitoneal bleed Now stable and feels better No pain CT 12/18 shows stable to smaller hematoma  Objective: Vital signs in last 24 hours: Temp:  [97.5 F (36.4 C)-99 F (37.2 C)] 99 F (37.2 C) (12/19 0819) Pulse Rate:  [66-84] 82  (12/19 0819) Resp:  [16-30] 19  (12/19 0819) BP: (103-131)/(55-70) 113/70 mmHg (12/19 0819) SpO2:  [91 %-97 %] 97 % (12/19 0819) Weight:  [213 lb 13.5 oz (97 kg)] 213 lb 13.5 oz (97 kg) (12/18 2323) Last BM Date: 02/21/12  Intake/Output from previous day: 12/18 0701 - 12/19 0700 In: 1973.3 [P.O.:360; I.V.:1613.3] Out: 1702 [Urine:1700; Stool:2] Intake/Output this shift:    PE:  Afeb; vss H/H: 8.6/24.8 (9.6/28.3) Up in chair; eating well No complaints   Lab Results:   [facility] 02/25/12 0059 02/24/12 1822  WBC 8.5 10.2  HGB 8.6* 9.6*  HCT 24.8* 28.3*  PLT 120* 131*   BMET  Basename 02/22/12 0858  NA 139  K 4.4  CL 104  CO2 26  GLUCOSE 132*  BUN 18  CREATININE 0.96  CALCIUM 8.8   PT/INR  Basename 02/24/12 1718  LABPROT 15.0  INR 1.20   ABG No results found for this basename: PHART:2,PCO2:2,PO2:2,HCO3:2 in the last 72 hours  Studies/Results: Ct Angio Abd/pel W/ And/or W/o  02/24/2012  *RADIOLOGY REPORT*  Clinical Data:  Renal lesion, post percutaneous ablation, with retroperitoneal hemorrhage.  CT ANGIOGRAPHY ABDOMEN AND PELVIS  Technique:  Multidetector CT imaging of the abdomen and pelvis was performed using the standard protocol during bolus administration of intravenous contrast.  Multiplanar reconstructed images including MIPs were obtained and reviewed to evaluate the vascular anatomy.  Contrast: OMNIPAQUE IOHEXOL 350 MG/ML SOLN  Comparison:  02/21/2012  Arterial findings: Previous AVR with patchy coronary calcifications noted. Aorta:                  Mild  scattered partially calcified atheromatous plaque.  Mild tortuosity.  No aneurysm, dissection, or stenosis.  Celiac axis:            Ectatic, distal branching unremarkable  Superior mesenteric:Widely patent  Left renal:             Widely patent.  No evidence of pseudoaneurysm, AV fistula, or active extravasation.  Right renal:            Single, widely patent  Inferior mesenteric:Patent  Left iliac:             Common iliac ectatic  2.2 cm diameter extending to its bifurcation.  External and internal iliacs are ectatic, patent.  Right iliac:            Common iliac ectatic distally to 21 mm diameter.  Internal and external iliacs are ectatic, patent.  Venous findings:  Patent hepatic veins, portal vein, superior mesenteric vein, splenic vein, bilateral renal veins, IVC, and iliac venous system.   Review of the MIP images confirms the above findings.  Nonvascular findings: Previous median sternotomy.  Small bilateral pleural effusions, left greater than right, increased since prior study.  Stable hepatic cysts.  Gallbladder physiologically distended.  Spleen and adrenal glands unremarkable.  Interval decrease in size of left perirenal retroperitoneal hematoma, largest component 5.6 x 10.1 cm transverse dimensions at the level of the lower pole (previously 6.3 x 10.3).  No new components are evident.  There is some layering of  blood products in the more inferior components anterior to the psoas musculature, with slightly decreased mass effect.  Ablation defect in the upper pole left kidney is stable.  There are smaller bilateral renal cysts.  No hydronephrosis.  No ascites.  No free air.  Stomach, small bowel, and colon are decompressed. Orthopedic hardware across the right femoral neck.  Mild spondylitic changes in the lumbar spine.  Dependent atelectasis posteriorly in the visualized lung bases.  IMPRESSION:  1.  Slight interval decrease in size of left perinephric/retroperitoneal hematoma. No new components. 2.   No evidence of active extravasation or pseudoaneurysm.   Original Report Authenticated By: D. Andria Rhein, MD     Anti-infectives: Anti-infectives    None      Assessment/Plan: s/p Left renal lesion cryoablation 12/13 RP bleed after resuming coumadin Readmitted 12/15 CT 12/18 stable HH/stable plan per cardio and TRH for resuming anticoagulation Call us of need [place]  LOS: 4 days    Tahir Blank A 02/25/2012

## 2012-02-25 NOTE — Progress Notes (Signed)
FMTS Attending Daily Note:  Renold Don MD  [phone] pager  Family Practice pager:  [phone] I have seen and examined this patient and have reviewed their chart. I have discussed this patient with the resident. I agree with the resident's findings, assessment and care plan.  Additionally: Patient with only minimal tenderness currently.  Hgb relatively stable 8 - 9 range.   1.  Retroperitoneal bleed:  Agree with heparin as anticoagulation choice.  FU for any signs of bleeding for couple of days then hopeful to DC home.   2.  A fib:  Rate controlled currently.

## 2012-02-25 NOTE — Progress Notes (Signed)
ANTICOAGULATION CONSULT NOTE - Initial Consult  Pharmacy Consult for Heparin Indication: Mechanical Aortic Valve, Atrial Fibrillation  No Known Allergies  Patient Measurements: Height: 6\' 4"  (193 cm) Weight: 213 lb 13.5 oz (97 kg) IBW/kg (Calculated) : 86.8   Vital Signs: Temp: 99 F (37.2 C) (12/19 0819) Temp src: Oral (12/19 0819) BP: 113/70 mmHg (12/19 0819) Pulse Rate: 82  (12/19 0819)  Labs:  Basename 02/25/12 0059 02/24/12 1822 02/24/12 1718 02/24/12 1347 02/22/12 0858  HGB 8.6* 9.6* -- -- --  HCT 24.8* 28.3* -- 25.1* --  PLT 120* 131* -- 105* --  APTT -- -- -- -- --  LABPROT -- -- 15.0 -- --  INR -- -- 1.20 -- --  HEPARINUNFRC -- -- -- -- --  CREATININE -- -- -- -- 0.96  CKTOTAL -- -- -- -- --  CKMB -- -- -- -- --  TROPONINI -- -- -- -- --    Estimated Creatinine Clearance: 85.4 ml/min (by C-G formula based on Cr of 0.96).   Medical History: Past Medical History  Diagnosis Date  . Marfan's syndrome with aortic dilation   . Hypertension   . Aortic aneurysm 1994    From presumed Marfan's Syndrome.  Followed by Dr Jens Som.  Aneurysm involving the proximal descending thoracic aorta .    Marland Kitchen Pulmonary nodules     Found on chest CT 08/2007.  Monitoring with yearly CT.  3.6mm nodule in R iddle lobe.  Marland Kitchen Hyperlipidemia   . Atrial fibrillation     Followed by Dr Jens Som.  On coumadin.  . Atrial flutter     Followed by Dr Jens Som  . Osteoarthritis   . History of blood transfusion     Medications:  Scheduled:    . [EXPIRED] furosemide      . metoprolol succinate  25 mg Oral Daily  . polyethylene glycol  17 g Oral Daily  . sodium chloride  3 mL Intravenous Q12H  . [COMPLETED] warfarin  2.5 mg Oral Once  . [DISCONTINUED] Warfarin - Pharmacist Dosing Inpatient   Does not apply q1800   Infusions:    . sodium chloride 50 mL/hr at 02/24/12 1930    Assessment: Mechanical Aortic Valve, Atrial Fibrillation:  71 year old male admitted 12/15 with  retroperitoneal bleed after renal cryoablation procedure 12/13.  He is chronically anticoagulated with Coumadin for his mechanical valve and atrial fibrillation, and was being bridged with Lovenox around his procedure.  Coumadin and Lovenox were held on admission, and he received 1 dose of Coumadin 12/18.  Coumadin has now been discontinued and Heparin bridging is requested.  Goal of Therapy:  Heparin level 0.3-0.7 units/ml Monitor platelets by anticoagulation protocol: Yes   Plan:  No heparin bolus Start heparin infusion at 1350 units/hr (~14 units/kg/hr) Check Heparin level in 8 hours.   Follow q8h CBCs with medical team Daily Heparin level and CBC  Estella Husk, Pharm.D., BCPS Clinical Pharmacist  Phone [phone] Pager [phone] 02/25/2012, 8:55 AM

## 2012-02-25 NOTE — Progress Notes (Signed)
Patient ID: Juan Wilkerson, male   DOB: November 08, 1940, 71 y.o.   MRN: [id number] [facility] Medicine Teaching Service PGY-1 Progress Note   Overnight Events: Patient appears well. States he is doing well and has had 3 BM since yesterday and his stomach feels much better. He is hoping to go home soon.  Objective: Temp:  [97.5 F (36.4 C)-98.8 F (37.1 C)] 98 F (36.7 C) (12/18 2323) Pulse Rate:  [66-84] 75  (12/19 0331) Cardiac Rhythm:  [-] Atrial fibrillation (12/18 1945) Resp:  [16-30] 17  (12/19 0331) BP: (93-131)/(55-71) 118/63 mmHg (12/19 0331) SpO2:  [91 %-96 %] 91 % (12/18 2323) Weight:  [213 lb 13.5 oz (97 kg)] 213 lb 13.5 oz (97 kg) (12/18 2323) Weight change: -7.1 oz (-0.2 kg)  BP 113/70  Pulse 82  Temp 99 F (37.2 C) (Oral)  Resp 19  Ht 6\' 4"  (1.93 m)  Wt 213 lb 13.5 oz (97 kg)  BMI 26.03 kg/m2  SpO2 97%  Physical Exam: Gen: NAD. In bedside chair preparing to eat breakfast. CV: irregularly irregular. HR 82 Lungs: CTAB. No wheezing, rhonchi or rales.  Abd: Soft. ND. Pain RLQ. BS+ EXT: NT. No erythema. +1/4 edema today.    CBC    Component Value Date/Time   WBC 8.5 02/25/2012 0059   RBC 2.82* 02/25/2012 0059   HGB 8.6* 02/25/2012 0059   HCT 24.8* 02/25/2012 0059   PLT 120* 02/25/2012 0059   MCV 87.9 02/25/2012 0059   MCH 30.5 02/25/2012 0059   MCHC 34.7 02/25/2012 0059   RDW 14.2 02/25/2012 0059   LYMPHSABS 0.9 02/21/2012 1520   MONOABS 1.8* 02/21/2012 1520   EOSABS 0.1 02/21/2012 1520   BASOSABS 0.0 02/21/2012 1520      Assessment and Plan: 71 yo male with history of A. 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   Retroperitoneal bleed: Was on coumadin for a.fib but transitioned to lovenox for recent ablation procedure. Had procedure on 12/13 and now found to have retroperitoneal bleed on CT.  - Initial Hgb in ED was 13.1-->10.8-->10.4--> 9.0--> after  1 unit transfusion--> 8.4--> 9.3-->8.6 -->9.3 - Will continue to monitor HGB.  - Urology consult yesterday, we thank them for their input, felt the bleed was stable. - CTA abdomen and pelvis: Resulted with slight improvement in retroperitoneal bleed and stable cryo site.  Atrial fibrillation: Currently in a. Fib with RVR. Placed on diltiazem drip in ED. Cardiology consulted in ED, appreciate input.  - Start po diltiazem 240 mg daily (1000)--> DC'd to home dose metoprolol   Mechanical aortic valve: Needs anticoagulation but has had retroperitoneal bleed. Hemoglobin has been wax/wane between 8.6-9.3.  - Coumadin 2.5 started this morning  HTN:  - Held amlodipine for now.   Renal mass: S/p core biopsy and cryo ablation on 12/13.  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  FEN/GI: NS @ 147mL/hr, Ice chips only for now  PPx: SCD's  Dispo: Pending further work up and improvement

## 2012-02-26 LAB — CBC
HCT: 25.1 % — ABNORMAL LOW (ref 39.0–52.0)
HCT: 26.7 % — ABNORMAL LOW (ref 39.0–52.0)
Hemoglobin: 8.9 g/dL — ABNORMAL LOW (ref 13.0–17.0)
MCH: 29.7 pg (ref 26.0–34.0)
MCHC: 34.6 g/dL (ref 30.0–36.0)
MCHC: 33.3 g/dL (ref 30.0–36.0)
MCV: 89 fL (ref 78.0–100.0)
Platelets: 139 10*3/uL — ABNORMAL LOW (ref 150–400)
Platelets: 171 10*3/uL (ref 150–400)
RBC: 2.87 MIL/uL — ABNORMAL LOW (ref 4.22–5.81)
RBC: 3 MIL/uL — ABNORMAL LOW (ref 4.22–5.81)
RDW: 13.9 % (ref 11.5–15.5)
RDW: 13.8 % (ref 11.5–15.5)
RDW: 13.8 % (ref 11.5–15.5)
WBC: 9.3 10*3/uL (ref 4.0–10.5)
WBC: 10 10*3/uL (ref 4.0–10.5)

## 2012-02-26 LAB — HEPARIN LEVEL (UNFRACTIONATED)
Heparin Unfractionated: <0.1 IU/mL — ABNORMAL LOW (ref 0.30–0.70)
Heparin Unfractionated: 0.51 IU/mL (ref 0.30–0.70)
Heparin Unfractionated: 0.46 IU/mL (ref 0.30–0.70)

## 2012-02-26 LAB — PROTIME-INR: INR: 1.24 (ref 0.00–1.49)

## 2012-02-26 MED ORDER — METOPROLOL SUCCINATE ER 50 MG PO TB24
50.0000 mg | ORAL_TABLET | Freq: Every day | ORAL | Status: DC
  Administered 2012-02-27 – 2012-03-03 (×6): 50 mg via ORAL
  Filled 2012-02-26 (×6): qty 1

## 2012-02-26 NOTE — Progress Notes (Signed)
Patient ID: Juan Wilkerson, male   DOB: [birth year], 71 y.o.   MRN: [id number]    SUBJECTIVE: Some left flank pain.  Hemoglobin variable: 8.6 > 9.7 > 10 > 8.7.  Started back yesterday am on heparin.  Atrial flutter rate controlled.      . metoprolol succinate  25 mg Oral Daily  . polyethylene glycol  17 g Oral Daily  . sodium chloride  3 mL Intravenous Q12H  heparin gtt   Filed Vitals:   02/26/12 0200 02/26/12 0334 02/26/12 0400 02/26/12 0600  BP: 118/64 130/70 123/63 122/73  Pulse:  82    Temp:  98.7 F (37.1 C)    TempSrc:  Oral    Resp: 21 14 25 15   Height:      Weight:      SpO2:        Intake/Output Summary (Last 24 hours) at 02/26/12 0800 Last data filed at 02/26/12 0700  Gross per 24 hour  Intake   2197 ml  Output   2100 ml  Net     97 ml    LABS: Basic Metabolic Panel: No results found for this basename: NA:2,K:2,CL:2,CO2:2,GLUCOSE:2,BUN:2,CREATININE:2,CALCIUM:2,MG:2,PHOS:2 in the last 72 hours Liver Function Tests: No results found for this basename: AST:2,ALT:2,ALKPHOS:2,BILITOT:2,PROT:2,ALBUMIN:2 in the last 72 hours No results found for this basename: LIPASE:2,AMYLASE:2 in the last 72 hours CBC:  Basename 02/26/12 0300 02/25/12 1830  WBC 9.3 10.0  NEUTROABS -- --  HGB 8.7* 10.0*  HCT 25.1* 29.1*  MCV 87.5 88.7  PLT 139* 149*   Cardiac Enzymes: No results found for this basename: CKTOTAL:3,CKMB:3,CKMBINDEX:3,TROPONINI:3 in the last 72 hours BNP: No components found with this basename: POCBNP:3 D-Dimer: No results found for this basename: DDIMER:2 in the last 72 hours Hemoglobin A1C: No results found for this basename: HGBA1C in the last 72 hours Fasting Lipid Panel: No results found for this basename: CHOL,HDL,LDLCALC,TRIG,CHOLHDL,LDLDIRECT in the last 72 hours Thyroid Function Tests: No results found for this basename: TSH,T4TOTAL,FREET3,T3FREE,THYROIDAB in the last 72 hours Anemia Panel: No results found for this basename:  VITAMINB12,FOLATE,FERRITIN,TIBC,IRON,RETICCTPCT in the last 72 hours  RADIOLOGY: Ct Abdomen Pelvis W Contrast  02/21/2012  *RADIOLOGY REPORT*  Clinical Data: Flank pain.  Recent left renal mass ablation.  CT ABDOMEN AND PELVIS WITH CONTRAST  Technique:  Multidetector CT imaging of the abdomen and pelvis was performed following the standard protocol during bolus administration of intravenous contrast.  Contrast: OMNIPAQUE IOHEXOL 300 MG/ML  SOLN  Comparison: CT images dated 12/13 and 11/20/2011  Findings: The patient has a large perirenal and retroperitoneal hemorrhage.  The left kidney is superiorly and anteriorly displaced by the hemorrhage.  There is no obstruction of the kidney.  The left upper pole renal mass that was ablated is visible.  There is also blood extending across the midline adjacent to the duodenum and into the right pericolic gutter.  The hematoma extends into the left side of the pelvis and slightly compresses the left side of the bladder.  There is also a small amount of blood in the  left upper quadrant around the spleen.  Foley catheter in the bladder.  Delayed imaging demonstrates normal excretion of contrast from both kidneys.  Incidental note is made of multiple small cysts in the liver as well as small cysts in both kidneys.  Pancreas and spleen and adrenal glands are normal.  The bowel is normal except for being displaced by the large hematoma.  No acute osseous abnormality.  IMPRESSION:    Large  retroperitoneal hemorrhage originating from the upper pole of the left kidney at the site of tumor ablation.  Secondary mass effect upon the kidney and adjacent bowel and bladder.  Critical Value/emergent results were called by telephone at the time of interpretation on 02/21/2012 at  7:21 p.m. to  Dr. Linwood Dibbles and to Dr. Malachy Moan,, who verbally acknowledged these results.   Original Report Authenticated By: Francene Boyers, M.D.    Ct Guide Tissue Ablation  02/19/2012   *RADIOLOGY REPORT*  Clinical Data:  2.5 cm posterior left renal mass.  The patient presents for biopsy and cryoablation.  CT-GUIDED CORE BIOPSY OF LEFT RENAL MASS. CT-GUIDED PERCUTANEOUS CRYOABLATION OF LEFT RENAL MASS.  Anesthesia:  General  Medications:  2 grams IV Ancef. As antibiotic prophylaxis, Ancef was ordered pre-procedure and administered intravenously within one hour of incision.  Procedure:  The procedure, risks, benefits, and alternatives were explained to the patient.  Questions regarding the procedure were encouraged and answered.  The patient understands and consents to the procedure.  The patient was placed under general anesthesia.  Initial unenhanced CT was performed in a prone position to localize the left renal mass.  The left flank region was prepped with Betadine in a sterile fashion, and a sterile drape was applied covering the operative field.  A sterile gown and sterile gloves were used for the procedure.  A 17 gauge trocar needle was advanced into the left renal mass. Core biopsy was performed with an 18 gauge automated core biopsy device.  A total of two core samples were submitted in formalin for pathologic analysis.  Under CT guidance, a total of three Galil Ice Rod Plus percutaneous cryoablation probes were advanced into the left renal mass.  Probe positioning was confirmed by CT prior to cryoablation. Prior to ablation, a 22 gauge spinal needle was advanced between the kidney and the spleen and a total of approximately 90 ml of sterile saline injected for hydrodissection.  Cryoablation was performed through the three probes simultaneously. Initial 10 minute cycle of cryoablation was performed.  This was followed by a 8 minute thaw cycle.  A second 10 minute cycle of cryoablation was then performed.  During ablation, periodic CT imaging was performed to monitor ice ball formation and morphology. After active thaw, the cryoablation probes were removed.  Post-procedural CT was performed.   Complications: None  Findings:  The exophytic mass emanating from the posterior aspect of the upper to mid kidney was well localized by unenhanced CT. Due to proximity of the spleen, hydrodissection was performed prior to treatment.  Biopsy was performed of the lesion prior to cryoablation.  Imaging during treatment shows adequate ice ball formation encompassing the lesion.  There were no immediate bleeding complications.  IMPRESSION:  CT guided percutaneous core biopsy and cryoablation of a left renal mass.  The patient will be observed overnight.  Initial follow-up will be performed in approximately 4 weeks.   Original Report Authenticated By: Irish Lack, M.D.    Ct Biopsy  02/19/2012  *RADIOLOGY REPORT*  Clinical Data:  2.5 cm posterior left renal mass.  The patient presents for biopsy and cryoablation.  CT-GUIDED CORE BIOPSY OF LEFT RENAL MASS. CT-GUIDED PERCUTANEOUS CRYOABLATION OF LEFT RENAL MASS.  Anesthesia:  General  Medications:  2 grams IV Ancef. As antibiotic prophylaxis, Ancef was ordered pre-procedure and administered intravenously within one hour of incision.  Procedure:  The procedure, risks, benefits, and alternatives were explained to the patient.  Questions regarding the procedure were encouraged  and answered.  The patient understands and consents to the procedure.  The patient was placed under general anesthesia.  Initial unenhanced CT was performed in a prone position to localize the left renal mass.  The left flank region was prepped with Betadine in a sterile fashion, and a sterile drape was applied covering the operative field.  A sterile gown and sterile gloves were used for the procedure.  A 17 gauge trocar needle was advanced into the left renal mass. Core biopsy was performed with an 18 gauge automated core biopsy device.  A total of two core samples were submitted in formalin for pathologic analysis.  Under CT guidance, a total of three Galil Ice Rod Plus percutaneous  cryoablation probes were advanced into the left renal mass.  Probe positioning was confirmed by CT prior to cryoablation. Prior to ablation, a 22 gauge spinal needle was advanced between the kidney and the spleen and a total of approximately 90 ml of sterile saline injected for hydrodissection.  Cryoablation was performed through the three probes simultaneously. Initial 10 minute cycle of cryoablation was performed.  This was followed by a 8 minute thaw cycle.  A second 10 minute cycle of cryoablation was then performed.  During ablation, periodic CT imaging was performed to monitor ice ball formation and morphology. After active thaw, the cryoablation probes were removed.  Post-procedural CT was performed.  Complications: None  Findings:  The exophytic mass emanating from the posterior aspect of the upper to mid kidney was well localized by unenhanced CT. Due to proximity of the spleen, hydrodissection was performed prior to treatment.  Biopsy was performed of the lesion prior to cryoablation.  Imaging during treatment shows adequate ice ball formation encompassing the lesion.  There were no immediate bleeding complications.  IMPRESSION:  CT guided percutaneous core biopsy and cryoablation of a left renal mass.  The patient will be observed overnight.  Initial follow-up will be performed in approximately 4 weeks.   Original Report Authenticated By: Irish Lack, M.D.    Ct Angio Abd/pel W/ And/or W/o  02/24/2012  *RADIOLOGY REPORT*  Clinical Data:  Renal lesion, post percutaneous ablation, with retroperitoneal hemorrhage.  CT ANGIOGRAPHY ABDOMEN AND PELVIS  Technique:  Multidetector CT imaging of the abdomen and pelvis was performed using the standard protocol during bolus administration of intravenous contrast.  Multiplanar reconstructed images including MIPs were obtained and reviewed to evaluate the vascular anatomy.  Contrast: OMNIPAQUE IOHEXOL 350 MG/ML SOLN  Comparison:  02/21/2012  Arterial  findings: Previous AVR with patchy coronary calcifications noted. Aorta:                  Mild scattered partially calcified atheromatous plaque.  Mild tortuosity.  No aneurysm, dissection, or stenosis.  Celiac axis:            Ectatic, distal branching unremarkable  Superior mesenteric:Widely patent  Left renal:             Widely patent.  No evidence of pseudoaneurysm, AV fistula, or active extravasation.  Right renal:            Single, widely patent  Inferior mesenteric:Patent  Left iliac:             Common iliac ectatic  2.2 cm diameter extending to its bifurcation.  External and internal iliacs are ectatic, patent.  Right iliac:            Common iliac ectatic distally to 21 mm diameter.  Internal and external  iliacs are ectatic, patent.  Venous findings:  Patent hepatic veins, portal vein, superior mesenteric vein, splenic vein, bilateral renal veins, IVC, and iliac venous system.   Review of the MIP images confirms the above findings.  Nonvascular findings: Previous median sternotomy.  Small bilateral pleural effusions, left greater than right, increased since prior study.  Stable hepatic cysts.  Gallbladder physiologically distended.  Spleen and adrenal glands unremarkable.  Interval decrease in size of left perirenal retroperitoneal hematoma, largest component 5.6 x 10.1 cm transverse dimensions at the level of the lower pole (previously 6.3 x 10.3).  No new components are evident.  There is some layering of blood products in the more inferior components anterior to the psoas musculature, with slightly decreased mass effect.  Ablation defect in the upper pole left kidney is stable.  There are smaller bilateral renal cysts.  No hydronephrosis.  No ascites.  No free air.  Stomach, small bowel, and colon are decompressed. Orthopedic hardware across the right femoral neck.  Mild spondylitic changes in the lumbar spine.  Dependent atelectasis posteriorly in the visualized lung bases.  IMPRESSION:  1.  Slight  interval decrease in size of left perinephric/retroperitoneal hematoma. No new components. 2.  No evidence of active extravasation or pseudoaneurysm.   Original Report Authenticated By: D. Andria Rhein, MD     PHYSICAL EXAM  General: NAD  Neck: No JVD, no thyromegaly or thyroid nodule.  Lungs: Clear to auscultation bilaterally with normal respiratory effort.  CV: Nondisplaced PMI. Heart mildly tachy, irregular S1/S2, no S3/S4, mechanical S2, no murmur. No peripheral edema. No carotid bruit. Normal pedal pulses.  Abdomen: Soft, nontender, no hepatosplenomegaly, no distention.  Neurologic: Alert and oriented x 3.  Psych: Normal affect.  Extremities: No clubbing or cyanosis.   TELEMETRY: Reviewed telemetry pt in atrial flutter, HR in the 80s   ASSESSMENT AND PLAN:  71 yo with history of Marfans, chronic atrial flutter/fibrillation and mechanical aortic valve presented with retroperitoneal bleed after renal cryoablation procedure (had bridging Lovenox).  1. Atrial flutter: HR stable, continue Toprol XL.   2. Mechanical aortic valve: Needs anticoagulation but has had retroperitoneal bleed. Hematoma stable on repeat CT.  IV heparin started yesterday am after discussion with Dr. Laverle Patter.  If he rebleeds, this can be quickly stopped and he can undergo renal angiography/embolization.  If he remains stable, will restart coumadin tomorrow.  Continue to follow CBC closely.   Marca Ancona 02/26/2012 8:00 AM

## 2012-02-26 NOTE — Progress Notes (Signed)
ANTICOAGULATION CONSULT NOTE - Follow Up Consult  Pharmacy Consult for Heparin Indication: Mechanical Aortic Valve, Atrial Fibrillation  No Known Allergies  Patient Measurements: Height: 6\' 4"  (193 cm) Weight: 223 lb 15.8 oz (101.6 kg) IBW/kg (Calculated) : 86.8  Heparin Dosing Weight: 97 kg  Vital Signs: Temp: 98.4 F (36.9 C) (12/20 1654) Temp src: Oral (12/20 1654) BP: 126/77 mmHg (12/20 1654) Pulse Rate: 92  (12/20 1654)  Labs:  Basename 02/26/12 1750 02/26/12 1037 02/26/12 0806 02/26/12 0300 02/25/12 0833 02/24/12 1718  HGB 8.9* -- 8.4* -- -- --  HCT 26.7* -- 24.3* 25.1* -- --  PLT 171 -- 146* 139* -- --  APTT -- -- -- -- -- --  LABPROT -- -- -- 15.4* 15.3* 15.0  INR -- -- -- 1.24 1.23 1.20  HEPARINUNFRC 0.46 0.51 -- <0.10* -- --  CREATININE -- -- -- -- -- --  CKTOTAL -- -- -- -- -- --  CKMB -- -- -- -- -- --  TROPONINI -- -- -- -- -- --   Estimated Creatinine Clearance: 85.4 ml/min (by C-G formula based on Cr of 0.96).  Medications:  Infusions:     . sodium chloride 1,000 mL (02/26/12 0851)  . heparin 2,000 Units/hr (02/26/12 1615)  . [DISCONTINUED] heparin 1,350 Units/hr (02/25/12 0933)   Assessment: 71 YOM admitted 12/15 with retroperitoneal bleed after renal cryoablation procedure 12/13. He is chronically anticoagulated with Coumadin for his mechanical valve and atrial fibrillation, and was being bridged with Lovenox around his procedure. Coumadin and Lovenox were held on admission, and he received 1 dose of Coumadin 12/18. Coumadin was discontinued and Heparin bridging was started 12/19.  His heparin level is now therapeutic after multiple rate adjustments.  No bleeding noted, his Hgb is stable at this time.  Goal of Therapy:  Heparin level 0.3-0.7 units/ml Monitor platelets by anticoagulation protocol: Yes   Plan:  Continue Heparin at 2000 units/hr. Follow-up q8h CBCs with medical team.  Daily heparin level and CBC.    Thank you for allowing  pharmacy to be a part of this patients care team.  Lovenia Kim Pharm.D., BCPS Clinical Pharmacist 02/26/2012 6:55 PM Pager: [phone] Phone: [phone]

## 2012-02-26 NOTE — Progress Notes (Signed)
Patient ID: DAO MEMMOTT, male   DOB: 1940/09/03, 71 y.o.   MRN: [id number]    Subjective: Pt without new complaints. On IV heparin for anticoagulation and being monitored with serial CBCs.  Objective: Vital signs in last 24 hours: Temp:  [98.7 F (37.1 C)-99.1 F (37.3 C)] 98.7 F (37.1 C) (12/20 0334) Pulse Rate:  [64-95] 82  (12/20 0334) Resp:  [13-28] 15  (12/20 0600) BP: (109-134)/(63-86) 122/73 mmHg (12/20 0600) SpO2:  [95 %-99 %] 95 % (12/20 0000) Weight:  [101.6 kg (223 lb 15.8 oz)] 101.6 kg (223 lb 15.8 oz) (12/19 2331)  Intake/Output from previous day: 12/19 0701 - 12/20 0700 In: 2177 [P.O.:720; I.V.:1457] Out: 2100 [Urine:2100] Intake/Output this shift: Total I/O In: 735.5 [I.V.:735.5] Out: 1250 [Urine:1250]  Physical Exam:  General: Alert and oriented Abdomen: Soft, ND, NT, Minimal CVAT   Lab Results:  Basename 02/26/12 0300 02/25/12 1830 02/25/12 0833  HGB 8.7* 10.0* 9.7*  HCT 25.1* 29.1* 28.4*      Assessment/Plan: - Continue IV heparin and serial H/H labs.  If stable on therapeutic anticoagulation with heparin for next 24 hrs, should be ok to begin transition to oral Coumadin although would proceed with caution so as not reach a supratherapeutic INR. Will continue to follow.   LOS: 5 days   Milia Warth,LES 02/26/2012, 6:27 AM

## 2012-02-26 NOTE — Progress Notes (Signed)
FMTS Attending Daily Note:  Renold Don MD  [phone] pager  Family Practice pager:  [phone] I have seen and examined this patient and have reviewed their chart. I have discussed this patient with the resident. I agree with the resident's findings, assessment and care plan.  Patient continues to improve.  Hgb remains stable.  Plan is for heparin with re-initiation of Coumadin tomorrow per Cards.  Appreciate recommendations.

## 2012-02-26 NOTE — Progress Notes (Signed)
Family Medicine Teaching Service Daily Progress Note Service Page: [phone]  Patient Assessment: Juan Wilkerson is a 71 yo male with history of A. 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   Subjective: Pt reports he is feeling well this morning. Had a large bowel movement earlier this morning. Says he is trying to rest.  Objective: Temp:  [97.7 F (36.5 C)-99.1 F (37.3 C)] 98 F (36.7 C) (12/20 1136) Pulse Rate:  [63-101] 63  (12/20 1136) Resp:  [11-28] 18  (12/20 1136) BP: (109-134)/(63-90) 115/79 mmHg (12/20 1136) SpO2:  [95 %-99 %] 99 % (12/20 1136) Weight:  [223 lb 15.8 oz (101.6 kg)] 223 lb 15.8 oz (101.6 kg) (12/19 2331) Exam: General: NAD, sitting up in chair, [place] in place Cardiovascular: RRR, mechanical valve audible Respiratory: diminished breath sounds throughout with some very soft expiratory wheezes Abdomen: nontender, moderately distended Neuro: nonfocal, speech intact  I have reviewed the patient's medications, labs, imaging, and diagnostic testing.  Notable results are summarized below.  Medications:  Scheduled Meds: Metoprolol XR 25mg  PO daily Miralax 17g PO daily Heparin drip  PRN Meds: Tylenol 650mg  q6h prn Maalox 30mL q6h prn Dilaudid 0.5-1mg  q3h prn Zofran 4mg  q6h prn  IVF: NS @ 50cc/hr  Labs:  CBC  Lab 02/26/12 0300 02/25/12 1830 02/25/12 0833  WBC 9.3 10.0 9.8  HGB 8.7* 10.0* 9.7*  HCT 25.1* 29.1* 28.4*  PLT 139* 149* 144*    INR 1.24  Imaging/Diagnostic Tests:  CT Angio Abd/Pelvis 02/24/12 1. Slight interval decrease in size of left perinephric/retroperitoneal hematoma. No new components.  2. No evidence of active extravasation or pseudoaneurysm.  Assessment/Plan:  71 yo male with history of A. Fib, Marfan syndrome, HTN, mechanical aortic valve and Renal neoplasm s/p biopsy with cryo ablation on 02/19/12 here with large  retroperitoneal bleed.   # Retroperitoneal bleed: Was on coumadin for a.fib but transitioned to lovenox for recent ablation procedure. Had procedure on 12/13 and now found to have retroperitoneal bleed on CT.  - CTA abdomen and pelvis 12/18: Resulted with slight improvement in retroperitoneal bleed and stable cryo site.  - Initial Hgb in ED was 13.1, down to 9.0 and pt received 1 unit PRBC transfusion on 12/17.  - Hgb today stable at 8.7. Continue to monitor HGB.  - Urology consult on 12/18, we thank them for their input, felt the bleed was stable.   # Atrial fibrillation: Currently in a. Fib with RVR. Placed on diltiazem drip in ED. Cardiology consulted in ED, appreciate input.  - Previously on diltiazem 240mg  daily during this hospitalization, which has since been discontinued in favor of home metoprolol. - Will increase metoprolol XR to 50mg  daily for better rate control, as pulse has been up to low 100s  # Mechanical aortic valve: Needs anticoagulation but has had retroperitoneal bleed. Hemoglobin has been waxing/waning between 8.6-9.3.  - On IV heparin. Per urology and cardiology notes, the plan is to monitor for another 24 hours, and if Hgb still stable, start coumadin tomorrow (12/21).  # HTN:  - Held amlodipine for now. BP's controlled. - Increasing metoprolol as above  # Renal mass: S/p core biopsy and cryo ablation on 12/13.  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

## 2012-02-26 NOTE — Progress Notes (Signed)
ANTICOAGULATION CONSULT NOTE - Follow Up Consult  Pharmacy Consult for Heparin Indication: Mechanical Aortic Valve, Atrial Fibrillation  No Known Allergies  Patient Measurements: Height: 6\' 4"  (193 cm) Weight: 223 lb 15.8 oz (101.6 kg) IBW/kg (Calculated) : 86.8  Heparin Dosing Weight: 97 kg  Vital Signs: Temp: 98 F (36.7 C) (12/20 1136) Temp src: Oral (12/20 1136) BP: 115/79 mmHg (12/20 1136) Pulse Rate: 63  (12/20 1136)  Labs:  Basename 02/26/12 1037 02/26/12 0806 02/26/12 0300 02/25/12 1830 02/25/12 0833 02/24/12 1718  HGB -- 8.4* 8.7* -- -- --  HCT -- 24.3* 25.1* 29.1* -- --  PLT -- 146* 139* 149* -- --  APTT -- -- -- -- -- --  LABPROT -- -- 15.4* -- 15.3* 15.0  INR -- -- 1.24 -- 1.23 1.20  HEPARINUNFRC 0.51 -- <0.10* 0.13* -- --  CREATININE -- -- -- -- -- --  CKTOTAL -- -- -- -- -- --  CKMB -- -- -- -- -- --  TROPONINI -- -- -- -- -- --   Estimated Creatinine Clearance: 85.4 ml/min (by C-G formula based on Cr of 0.96).  Medications:  Infusions:     . sodium chloride 1,000 mL (02/26/12 0851)  . heparin 2,000 Units/hr (02/26/12 0428)  . [DISCONTINUED] heparin 1,350 Units/hr (02/25/12 0933)   Assessment: 71 YOM admitted 12/15 with retroperitoneal bleed after renal cryoablation procedure 12/13. He is chronically anticoagulated with Coumadin for his mechanical valve and atrial fibrillation, and was being bridged with Lovenox around his procedure. Coumadin and Lovenox were held on admission, and he received 1 dose of Coumadin 12/18. Coumadin was discontinued and Heparin bridging was started 12/19.  His heparin level is now therapeutic after multiple rate adjustments.  No bleeding noted, his Hgb is stable at this time.  Goal of Therapy:  Heparin level 0.3-0.7 units/ml Monitor platelets by anticoagulation protocol: Yes   Plan:  Continue Heparin at 2000 units/hr. Recheck Heparin level in 8 hours to confirm Follow-up q8h CBCs with medical team.  Daily heparin  level and CBC.   Estella Husk, Pharm.D., BCPS Clinical Pharmacist  Phone [phone] Pager [phone] 02/26/2012, 12:39 PM

## 2012-02-26 NOTE — Progress Notes (Signed)
ANTICOAGULATION CONSULT NOTE - Follow Up Consult  Pharmacy Consult for heparin Indication: AVR/Afib  Labs:  Basename 02/26/12 0300 02/25/12 1830 02/25/12 0833 02/24/12 1718  HGB 8.7* 10.0* -- --  HCT 25.1* 29.1* 28.4* --  PLT 139* 149* 144* --  APTT -- -- -- --  LABPROT 15.4* -- 15.3* 15.0  INR 1.24 -- 1.23 1.20  HEPARINUNFRC <0.10* 0.13* -- --  CREATININE -- -- -- --  CKTOTAL -- -- -- --  CKMB -- -- -- --  TROPONINI -- -- -- --    Assessment: 71yo male now undetectable on heparin despite rate increase; no interruptions or issues with gtt per RN; H/H back down though no overt signs of bleeding.  Goal of Therapy:  Heparin level 0.3-0.7 units/ml   Plan:  Will increase heparin gtt by ~3 units/kg/hr to 2000 units/hr (RN changed heparin bag after lab drawn which may affect level as well) and check level in 6hr.  Colleen Can PharmD BCPS 02/26/2012,4:27 AM

## 2012-02-26 NOTE — Progress Notes (Signed)
Subjective: RP bleed post Lt renal lesion cryoablation 12/13 Stable 12/18 CT shows stable to smaller hematoma Pt up and feeling well  Objective: Vital signs in last 24 hours: Temp:  [98.7 F (37.1 C)-99.1 F (37.3 C)] 98.7 F (37.1 C) (12/20 0334) Pulse Rate:  [64-95] 82  (12/20 0334) Resp:  [13-28] 15  (12/20 0600) BP: (109-134)/(63-86) 122/73 mmHg (12/20 0600) SpO2:  [95 %-99 %] 95 % (12/20 0000) Weight:  [223 lb 15.8 oz (101.6 kg)] 223 lb 15.8 oz (101.6 kg) (12/19 2331) Last BM Date: 02/24/12  Intake/Output from previous day: 12/19 0701 - 12/20 0700 In: 2247 [P.O.:720; I.V.:1527] Out: 2100 [Urine:2100] Intake/Output this shift:    PE:  Afeb; VSS H/H: 8.7/25.1 (10/29) On hep drip since yesterday To start coumadin today  Lab Results:   Basename 02/26/12 0300 02/25/12 1830  WBC 9.3 10.0  HGB 8.7* 10.0*  HCT 25.1* 29.1*  PLT 139* 149*   BMET No results found for this basename: NA:2,K:2,CL:2,CO2:2,GLUCOSE:2,BUN:2,CREATININE:2,CALCIUM:2 in the last 72 hours PT/INR  Basename 02/26/12 0300 02/25/12 0833  LABPROT 15.4* 15.3*  INR 1.24 1.23   ABG No results found for this basename: PHART:2,PCO2:2,PO2:2,HCO3:2 in the last 72 hours  Studies/Results: Ct Angio Abd/pel W/ And/or W/o  02/24/2012  *RADIOLOGY REPORT*  Clinical Data:  Renal lesion, post percutaneous ablation, with retroperitoneal hemorrhage.  CT ANGIOGRAPHY ABDOMEN AND PELVIS  Technique:  Multidetector CT imaging of the abdomen and pelvis was performed using the standard protocol during bolus administration of intravenous contrast.  Multiplanar reconstructed images including MIPs were obtained and reviewed to evaluate the vascular anatomy.  Contrast: OMNIPAQUE IOHEXOL 350 MG/ML SOLN  Comparison:  02/21/2012  Arterial findings: Previous AVR with patchy coronary calcifications noted. Aorta:                  Mild scattered partially calcified atheromatous plaque.  Mild tortuosity.  No aneurysm, dissection,  or stenosis.  Celiac axis:            Ectatic, distal branching unremarkable  Superior mesenteric:Widely patent  Left renal:             Widely patent.  No evidence of pseudoaneurysm, AV fistula, or active extravasation.  Right renal:            Single, widely patent  Inferior mesenteric:Patent  Left iliac:             Common iliac ectatic  2.2 cm diameter extending to its bifurcation.  External and internal iliacs are ectatic, patent.  Right iliac:            Common iliac ectatic distally to 21 mm diameter.  Internal and external iliacs are ectatic, patent.  Venous findings:  Patent hepatic veins, portal vein, superior mesenteric vein, splenic vein, bilateral renal veins, IVC, and iliac venous system.   Review of the MIP images confirms the above findings.  Nonvascular findings: Previous median sternotomy.  Small bilateral pleural effusions, left greater than right, increased since prior study.  Stable hepatic cysts.  Gallbladder physiologically distended.  Spleen and adrenal glands unremarkable.  Interval decrease in size of left perirenal retroperitoneal hematoma, largest component 5.6 x 10.1 cm transverse dimensions at the level of the lower pole (previously 6.3 x 10.3).  No new components are evident.  There is some layering of blood products in the more inferior components anterior to the psoas musculature, with slightly decreased mass effect.  Ablation defect in the upper pole left kidney is stable.  There  are smaller bilateral renal cysts.  No hydronephrosis.  No ascites.  No free air.  Stomach, small bowel, and colon are decompressed. Orthopedic hardware across the right femoral neck.  Mild spondylitic changes in the lumbar spine.  Dependent atelectasis posteriorly in the visualized lung bases.  IMPRESSION:  1.  Slight interval decrease in size of left perinephric/retroperitoneal hematoma. No new components. 2.  No evidence of active extravasation or pseudoaneurysm.   Original Report Authenticated By: D.  Andria Rhein, MD     Anti-infectives: Anti-infectives    None      Assessment/Plan: s/p Lt renal lesion cryoablation 12/13 Post RP bleed Pt doing well On hep drip since yesterday To start coumadin today per cardio We will follow   LOS: 5 days    Juan Wilkerson A 02/26/2012

## 2012-02-26 NOTE — Evaluation (Signed)
Physical Therapy Evaluation Patient Details Name: Juan Wilkerson MRN: [id number] DOB: 09-21-1940 Today's Date: 02/26/2012 Time: 2130-8657 PT Time Calculation (min): 21 min  PT Assessment / Plan / Recommendation Clinical Impression  Pt admitted with retroperitoneal bleed and progressing well with mobility. Will follow acutely to maximize gait and mobility to return pt to PLOF prior to discharge. Do not anticipate needs post acute. Pt normally lives alone but states he has a friend he can go stay with when he leaves the hospital as needed.     PT Assessment  Patient needs continued PT services    Follow Up Recommendations  No PT follow up    Does the patient have the potential to tolerate intense rehabilitation      Barriers to Discharge Decreased caregiver support      Equipment Recommendations  None recommended by PT    Recommendations for Other Services     Frequency Min 3X/week    Precautions / Restrictions Precautions Precautions: Fall pt reports 1 fall in last year  Pertinent Vitals/Pain No pain HR 110-124 with activity, pt fatigued end of session sats 95% on RA      Mobility  Bed Mobility Bed Mobility: Not assessed Transfers Transfers: Sit to Stand;Stand to Sit Sit to Stand: 6: Modified independent (Device/Increase time);With armrests;From chair/3-in-1 Stand to Sit: 6: Modified independent (Device/Increase time);To chair/3-in-1;With armrests Ambulation/Gait Ambulation/Gait Assistance: 5: Supervision Ambulation Distance (Feet): 400 Feet Assistive device: Rolling walker;None Ambulation/Gait Assistance Details: Pt walked 300' with RW for pt comfort with cueing for position in RW and posture and last 100' without AD with increased sway to gait but no LOB Gait Pattern: Step-through pattern;Decreased stride length;Trunk flexed Gait velocity: WFL Stairs: No    Shoulder Instructions     Exercises     PT Diagnosis: Difficulty walking  PT Problem List: Decreased  activity tolerance;Decreased mobility;Decreased knowledge of use of DME PT Treatment Interventions: Therapeutic activities;Therapeutic exercise;Functional mobility training;Patient/family education;Stair training;Gait training   PT Goals Acute Rehab PT Goals PT Goal Formulation: With patient Time For Goal Achievement: 03/04/12 Potential to Achieve Goals: Good Pt will go Supine/Side to Sit: with modified independence;with HOB 0 degrees PT Goal: Supine/Side to Sit - Progress: Goal set today Pt will go Sit to Supine/Side: with modified independence;with HOB 0 degrees PT Goal: Sit to Supine/Side - Progress: Goal set today Pt will Ambulate: >150 feet;Independently PT Goal: Ambulate - Progress: Goal set today Pt will Go Up / Down Stairs: Flight;with rail(s);with modified independence PT Goal: Up/Down Stairs - Progress: Goal set today  Visit Information  Last PT Received On: 02/26/12 Assistance Needed: +1    Subjective Data  Subjective: 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, 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 Patient Stated Goal: return home   Prior Functioning  Home Living Lives With: Alone Available Help at Discharge: Friend(s);Available PRN/intermittently Type of Home: House Home Access: Stairs to enter Entrance Stairs-Number of Steps: 3 Home Layout: Two level;Bed/bath upstairs Alternate Level Stairs-Number of Steps: 12 Alternate Level Stairs-Rails: Right Bathroom Shower/Tub: Health visitor: Standard Home Adaptive Equipment: Crutches;Walker - rolling Prior Function Level of Independence: Independent Able to Take Stairs?: Yes Driving: Yes Vocation: Retired 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: No difficulties    Cognition  Overall Cognitive Status: Appears within functional limits for tasks assessed/performed Arousal/Alertness: Awake/alert Orientation Level: Appears intact for tasks assessed Behavior  During Session: [facility] for tasks performed    Extremity/Trunk Assessment Right  Upper Extremity Assessment RUE ROM/Strength/Tone: [facility] for tasks assessed Left Upper Extremity Assessment LUE ROM/Strength/Tone: [facility] for tasks assessed Right Lower Extremity Assessment RLE ROM/Strength/Tone: [facility] for tasks assessed Left Lower Extremity Assessment LLE ROM/Strength/Tone: WFL for tasks assessed Trunk Assessment Trunk Assessment: Normal   Balance    End of Session PT - End of Session Activity Tolerance: Patient tolerated treatment well Patient left: in chair;with call bell/phone within reach Nurse Communication: Mobility status       Juan Wilkerson 02/26/2012, 4:49 PM  Juan Wilkerson, PT [phone]

## 2012-02-27 LAB — HEPARIN LEVEL (UNFRACTIONATED)
Heparin Unfractionated: 0.29 IU/mL — ABNORMAL LOW (ref 0.30–0.70)
Heparin Unfractionated: 0.53 IU/mL (ref 0.30–0.70)

## 2012-02-27 LAB — CBC
Hemoglobin: 9.8 g/dL — ABNORMAL LOW (ref 13.0–17.0)
MCHC: 33.8 g/dL (ref 30.0–36.0)
MCV: 89.8 fL (ref 78.0–100.0)
Platelets: 155 10*3/uL (ref 150–400)
Platelets: 206 10*3/uL (ref 150–400)
RBC: 2.77 MIL/uL — ABNORMAL LOW (ref 4.22–5.81)
RDW: 13.9 % (ref 11.5–15.5)
WBC: 8 10*3/uL (ref 4.0–10.5)
WBC: 9.3 10*3/uL (ref 4.0–10.5)

## 2012-02-27 LAB — PROTIME-INR: Prothrombin Time: 16.6 seconds — ABNORMAL HIGH (ref 11.6–15.2)

## 2012-02-27 MED ORDER — FUROSEMIDE 8 MG/ML PO SOLN
40.0000 mg | Freq: Once | ORAL | Status: AC
  Administered 2012-02-27: 40 mg via ORAL
  Filled 2012-02-27 (×2): qty 5

## 2012-02-27 MED ORDER — WARFARIN SODIUM 3 MG PO TABS
3.0000 mg | ORAL_TABLET | Freq: Once | ORAL | Status: AC
  Administered 2012-02-27: 3 mg via ORAL
  Filled 2012-02-27: qty 1

## 2012-02-27 MED ORDER — METOPROLOL SUCCINATE ER 25 MG PO TB24
25.0000 mg | ORAL_TABLET | Freq: Every day | ORAL | Status: DC
  Administered 2012-02-27 – 2012-03-02 (×5): 25 mg via ORAL
  Filled 2012-02-27 (×8): qty 1

## 2012-02-27 MED ORDER — WARFARIN - PHARMACIST DOSING INPATIENT
Freq: Every day | Status: DC
  Administered 2012-02-27 – 2012-03-01 (×2)

## 2012-02-27 NOTE — Progress Notes (Signed)
Patient ID: Juan Wilkerson, male   DOB: January 11, 1941, 71 y.o.   MRN: [id number]    SUBJECTIVE:   Feeling better. Walking halls with walker. AFL rate elevated last night but back down now. Hgb stable on heparin.      . metoprolol succinate  50 mg Oral Daily  . polyethylene glycol  17 g Oral Daily  . sodium chloride  3 mL Intravenous Q12H  heparin gtt   Filed Vitals:   02/27/12 0900 02/27/12 1000 02/27/12 1100 02/27/12 1142  BP:    127/71  Pulse:    67  Temp:    97.6 F (36.4 C)  TempSrc:    Oral  Resp: 27 22 15 13   Height:      Weight:      SpO2:    98%    Intake/Output Summary (Last 24 hours) at 02/27/12 1205 Last data filed at 02/27/12 1142  Gross per 24 hour  Intake   2664 ml  Output   1900 ml  Net    764 ml    LABS: Basic Metabolic Panel: No results found for this basename: NA:2,K:2,CL:2,CO2:2,GLUCOSE:2,BUN:2,CREATININE:2,CALCIUM:2,MG:2,PHOS:2 in the last 72 hours Liver Function Tests: No results found for this basename: AST:2,ALT:2,ALKPHOS:2,BILITOT:2,PROT:2,ALBUMIN:2 in the last 72 hours No results found for this basename: LIPASE:2,AMYLASE:2 in the last 72 hours CBC:  Basename 02/27/12 0916 02/27/12 0128  WBC 9.3 8.0  NEUTROABS -- --  HGB 10.0* 8.3*  HCT 29.8* 24.6*  MCV 89.8 88.8  PLT 206 155   Cardiac Enzymes: No results found for this basename: CKTOTAL:3,CKMB:3,CKMBINDEX:3,TROPONINI:3 in the last 72 hours BNP: No components found with this basename: POCBNP:3 D-Dimer: No results found for this basename: DDIMER:2 in the last 72 hours Hemoglobin A1C: No results found for this basename: HGBA1C in the last 72 hours Fasting Lipid Panel: No results found for this basename: CHOL,HDL,LDLCALC,TRIG,CHOLHDL,LDLDIRECT in the last 72 hours Thyroid Function Tests: No results found for this basename: TSH,T4TOTAL,FREET3,T3FREE,THYROIDAB in the last 72 hours Anemia Panel: No results found for this basename: VITAMINB12,FOLATE,FERRITIN,TIBC,IRON,RETICCTPCT in the last  72 hours  RADIOLOGY: Ct Abdomen Pelvis W Contrast  02/21/2012  *RADIOLOGY REPORT*  Clinical Data: Flank pain.  Recent left renal mass ablation.  CT ABDOMEN AND PELVIS WITH CONTRAST  Technique:  Multidetector CT imaging of the abdomen and pelvis was performed following the standard protocol during bolus administration of intravenous contrast.  Contrast: OMNIPAQUE IOHEXOL 300 MG/ML  SOLN  Comparison: CT images dated 12/13 and 11/20/2011  Findings: The patient has a large perirenal and retroperitoneal hemorrhage.  The left kidney is superiorly and anteriorly displaced by the hemorrhage.  There is no obstruction of the kidney.  The left upper pole renal mass that was ablated is visible.  There is also blood extending across the midline adjacent to the duodenum and into the right pericolic gutter.  The hematoma extends into the left side of the pelvis and slightly compresses the left side of the bladder.  There is also a small amount of blood in the  left upper quadrant around the spleen.  Foley catheter in the bladder.  Delayed imaging demonstrates normal excretion of contrast from both kidneys.  Incidental note is made of multiple small cysts in the liver as well as small cysts in both kidneys.  Pancreas and spleen and adrenal glands are normal.  The bowel is normal except for being displaced by the large hematoma.  No acute osseous abnormality.  IMPRESSION:    Large retroperitoneal hemorrhage originating from the upper  pole of the left kidney at the site of tumor ablation.  Secondary mass effect upon the kidney and adjacent bowel and bladder.  Critical Value/emergent results were called by telephone at the time of interpretation on 02/21/2012 at  7:21 p.m. to  Dr. Linwood Dibbles and to Dr. Malachy Moan,, who verbally acknowledged these results.   Original Report Authenticated By: Francene Boyers, M.D.    PHYSICAL EXAM  General: NAD. Walking halls with walker.  Neck: JVP 8 no thyromegaly or thyroid nodule.    Lungs: Clear to auscultation bilaterally with normal respiratory effort.  CV: Nondisplaced PMI. irregular S1/S2, no S3/S4, mechanical S2, no murmur. Trace  edema. No carotid bruit. Normal pedal pulses.  Abdomen: Soft, nontender, no hepatosplenomegaly, no distention.  Neurologic: Alert and oriented x 3.  Psych: Normal affect.  Extremities: No clubbing or cyanosis.   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  1. Atrial flutter: HR a bit labile will increase Toprol XL slightly. Can give some lasix as needed for volume overload. 2. Mechanical aortic valve: Tolerating heparin. Hgb stable. Willstart coumadin per pharmacy. Be careful with heparin overlap.  Continue to follow CBC closely.   Reuel Boom Bensimhon 02/27/2012 12:05 PM

## 2012-02-27 NOTE — Progress Notes (Signed)
Patient ID: Juan Wilkerson, male   DOB: [birth year], 71 y.o.   MRN: [id number]    Subjective: No new complaints. Hemoglobin has been stable.  ROS: Negative: chest pain or SOB  Objective: Vital signs in last 24 hours: Temp:  [97.4 F (36.3 C)-98.4 F (36.9 C)] 97.4 F (36.3 C) (12/21 0454) Pulse Rate:  [63-126] 65  (12/21 0500) Resp:  [10-24] 11  (12/21 0500) BP: (104-142)/(62-96) 116/87 mmHg (12/21 0454) SpO2:  [93 %-99 %] 95 % (12/21 0500) Weight:  [100.4 kg (221 lb 5.5 oz)] 100.4 kg (221 lb 5.5 oz) (12/21 0454)  Intake/Output from previous day: 12/20 0701 - 12/21 0700 In: 2483 [P.O.:960; I.V.:1523] Out: 1151 [Urine:1150; Stool:1] Intake/Output this shift: Total I/O In: 1163 [P.O.:480; I.V.:683] Out: 450 [Urine:450]  Physical Exam:  General: Alert and oriented Abdomen: Soft, ND, NT, Minimal CVAT, small left flank ecchymosis (outlined today) GU: foley in place draining clear yellow urine, genital ecchymosis (outlined today)   Lab Results:  Basename 02/27/12 0128 02/26/12 1750 02/26/12 0806  HGB 8.3* 8.9* 8.4*  HCT 24.6* 26.7* 24.3*      Assessment/Plan: - Continue IV heparin and serial H/H labs.  -Monitor INR when starting coumadin.    LOS: 6 days   Milford Cage 02/27/2012, 6:11 AM

## 2012-02-27 NOTE — Progress Notes (Signed)
Pt post void residual 290 ml, MD notified

## 2012-02-27 NOTE — Progress Notes (Signed)
FMTS Attending Daily Note:  Renold Don MD  [phone] pager  Family Practice pager:  [phone] I have seen and examined this patient and have reviewed their chart. I have discussed this patient with the resident. I agree with the resident's findings, assessment and care plan.  Restarting Coumadin today.  Appreciate cardiology/urology inputs.  Will monitor CBC closely.   SL IVF.  Trial off Foley.  Lasix for some LE edema secondary to volume overload today.  Continue ambulation.

## 2012-02-27 NOTE — Progress Notes (Signed)
ANTICOAGULATION CONSULT NOTE - Follow Up Consult  Pharmacy Consult for heparin Indication: AVR/Afib  Labs:  Basename 02/27/12 0128 02/26/12 1750 02/26/12 1037 02/26/12 0806 02/26/12 0300 02/25/12 0833  HGB 8.3* 8.9* -- -- -- --  HCT 24.6* 26.7* -- 24.3* -- --  PLT 155 171 -- 146* -- --  APTT -- -- -- -- -- --  LABPROT 16.6* -- -- -- 15.4* 15.3*  INR 1.38 -- -- -- 1.24 1.23  HEPARINUNFRC 0.29* 0.46 0.51 -- -- --  CREATININE -- -- -- -- -- --  CKTOTAL -- -- -- -- -- --  CKMB -- -- -- -- -- --  TROPONINI -- -- -- -- -- --    Assessment: 71yo male now subtherapeutic on heparin after two levels at goal; bag was changed within last 12 hours which could account for variability.  Goal of Therapy:  Heparin level 0.3-0.7 units/ml   Plan:  Will increase heparin gtt by 1 units/kg/hr to 2100 units/hr and check level in 6hr.  Colleen Can PharmD BCPS 02/27/2012,2:28 AM

## 2012-02-27 NOTE — Progress Notes (Signed)
Patient ID: Juan Wilkerson, male   DOB: 1940-12-27, 71 y.o.   MRN: [id number] [facility] Medicine Teaching Service PGY-1 Progress Note   Overnight Events: Patient is doing well today. He has no complaints, awaiting breakfast to be delivered. Would like to attempt to ambulate more.  Objective: BP 105/66  Pulse 65  Temp 99.3 F (37.4 C) (Oral)  Resp 15  Ht 6\' 4"  (1.93 m)  Wt 221 lb 5.5 oz (100.4 kg)  BMI 26.94 kg/m2  SpO2 95%  Physical Exam: Gen: NAD.  CV: irregularly irregular. Mechanical valve.  Lungs: CTAB. No wheezing, rhonchi or rales.  Abd: Soft. NT.ND, except for LLQ- with no change. No HSM. Marked areas of ecchymosis today. EXT: NT. No erythema. Bilateral feet swollen today, not pitting edema.   CBC    Component Value Date/Time   WBC 8.0 02/27/2012 0128   RBC 2.77* 02/27/2012 0128   HGB 8.3* 02/27/2012 0128   HCT 24.6* 02/27/2012 0128   PLT 155 02/27/2012 0128   MCV 88.8 02/27/2012 0128   MCH 30.0 02/27/2012 0128   MCHC 33.7 02/27/2012 0128   RDW 13.8 02/27/2012 0128   LYMPHSABS 0.9 02/21/2012 1520   MONOABS 1.8* 02/21/2012 1520   EOSABS 0.1 02/21/2012 1520   BASOSABS 0.0 02/21/2012 1520    Assessment and Plan: Assessment/Plan:  71 yo male with history of A. 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    Retroperitoneal bleed: Was on coumadin for a.fib but transitioned to lovenox for recent ablation procedure. Had procedure on 12/13 and now found to have retroperitoneal bleed on CT.  - CTA abdomen and pelvis 12/18: Resulted with slight improvement in retroperitoneal bleed and stable cryo site.  - Initial Hgb in ED was 13.1, down to 9.0 and pt received 1 unit PRBC transfusion on 12/17.  - Hgb today stable at 10.0. Continue to monitor HGB every 8hour - Urology consult on 12/18, we thank them for their input, felt the bleed was stable. Patient still has foley  catheter in place d/t difficulty with urination on admission. Would like to remove to access voiding ability. Will remove today.  Atrial fibrillation: Currently in a. Fib with RVR. Placed on diltiazem drip in ED. Cardiology consulted in ED, appreciate input.  - Previously on diltiazem 240mg  daily during this hospitalization, which has since been discontinued in favor of home metoprolol.  -  Metoprolol XR to 50mg  daily for better rate control, as pulse has been up to low 100s yesterday, still has moments of tachycardic. Rate controlled for the most part.    Mechanical aortic valve: Needs anticoagulation but has had retroperitoneal bleed. Hemoglobin has been waxing/waning between 8.6-9.3.  - On IV heparin. Patient has been stable with is hgb, will start coumadin per pharmacy today.   HTN:  - Held amlodipine for now. BP's controlled.  - Increasing metoprolol as above   Renal mass: S/p core biopsy and cryo ablation on 12/13.  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

## 2012-02-27 NOTE — Progress Notes (Signed)
ANTICOAGULATION CONSULT NOTE - Follow Up Consult  Pharmacy Consult for Heparin Indication: Mechanical Aortic Valve, Atrial Fibrillation  No Known Allergies  Patient Measurements: Height: 6\' 4"  (193 cm) Weight: 221 lb 5.5 oz (100.4 kg) IBW/kg (Calculated) : 86.8  Heparin Dosing Weight: 97 kg  Vital Signs: Temp: 97.6 F (36.4 C) (12/21 1142) Temp src: Oral (12/21 1142) BP: 127/71 mmHg (12/21 1142) Pulse Rate: 67  (12/21 1142)  Labs:  Basename 02/27/12 0916 02/27/12 0128 02/26/12 1750 02/26/12 0300 02/25/12 0833  HGB 10.0* 8.3* -- -- --  HCT 29.8* 24.6* 26.7* -- --  PLT 206 155 171 -- --  APTT -- -- -- -- --  LABPROT -- 16.6* -- 15.4* 15.3*  INR -- 1.38 -- 1.24 1.23  HEPARINUNFRC 0.53 0.29* 0.46 -- --  CREATININE -- -- -- -- --  CKTOTAL -- -- -- -- --  CKMB -- -- -- -- --  TROPONINI -- -- -- -- --   Estimated Creatinine Clearance: 85.4 ml/min (by C-G formula based on Cr of 0.96).  Medications:  Infusions:     . heparin 2,100 Units/hr (02/27/12 0239)  . [DISCONTINUED] sodium chloride 1,000 mL (02/27/12 0548)   Assessment: 71 y.o. M admitted 12/15 with retroperitoneal bleed after renal cryoablation procedure 12/13. He is chronically anticoagulated with Coumadin for his mechanical valve and atrial fibrillation, and was being bridged with Lovenox around his procedure. Coumadin and Lovenox were held on admission, and he received 1 dose of Coumadin 12/18. Coumadin was discontinued and Heparin bridging was started 12/19.  Heparin level this morning remains therapeutic (HL 0.53, goal of 0.3-0.7). Hgb/Hct/Plt remain stable this morning. Per cardiology, can resume warfarin dosing this evening. INR 1.38 this morning. PTA dose was 3 mg daily. Will dose cautiously given recent retroperitoneal bleed and aim for lower end of goal INR range.  Goal of Therapy:  Heparin level 0.3-0.7 units/ml INR 2.5-3.5 (will aim for lower end of range -- 2.5-3) Monitor platelets by anticoagulation  protocol: Yes   Plan:  1. Continue heparin at current rate of 2100 units/hr (21 ml/hr) 2. Warfarin 3 mg x 1 dose at 1800 today 3. Will continue to monitor for any signs/symptoms of bleeding and will follow up with heparin level and PT/INR in the a.m.   Georgina Pillion, PharmD, BCPS Clinical Pharmacist Pager: [phone] 02/27/2012 12:36 PM

## 2012-02-28 LAB — CBC
Hemoglobin: 8.6 g/dL — ABNORMAL LOW (ref 13.0–17.0)
MCH: 30 pg (ref 26.0–34.0)
MCHC: 33.9 g/dL (ref 30.0–36.0)
MCV: 88.4 fL (ref 78.0–100.0)
Platelets: 181 10*3/uL (ref 150–400)
Platelets: 212 10*3/uL (ref 150–400)
RDW: 13.9 % (ref 11.5–15.5)
WBC: 8.4 10*3/uL (ref 4.0–10.5)

## 2012-02-28 LAB — BASIC METABOLIC PANEL
BUN: 14 mg/dL (ref 6–23)
GFR calc Af Amer: >90 mL/min (ref >90)
GFR calc non Af Amer: >90 mL/min (ref >90)
Potassium: 3.3 mEq/L — ABNORMAL LOW (ref 3.5–5.1)

## 2012-02-28 LAB — PROTIME-INR
INR: 1.24 (ref 0.00–1.49)
Prothrombin Time: 15.9 seconds — ABNORMAL HIGH (ref 11.6–15.2)
Prothrombin Time: 15.4 seconds — ABNORMAL HIGH (ref 11.6–15.2)

## 2012-02-28 LAB — HEPARIN LEVEL (UNFRACTIONATED)
Heparin Unfractionated: 0.3 IU/mL (ref 0.30–0.70)
Heparin Unfractionated: 0.27 IU/mL — ABNORMAL LOW (ref 0.30–0.70)

## 2012-02-28 MED ORDER — WARFARIN SODIUM 3 MG PO TABS
3.0000 mg | ORAL_TABLET | Freq: Once | ORAL | Status: AC
  Administered 2012-02-28: 3 mg via ORAL
  Filled 2012-02-28: qty 1

## 2012-02-28 MED ORDER — TAMSULOSIN HCL 0.4 MG PO CAPS
0.4000 mg | ORAL_CAPSULE | Freq: Every day | ORAL | Status: DC
  Administered 2012-02-28 – 2012-03-03 (×5): 0.4 mg via ORAL
  Filled 2012-02-28 (×5): qty 1

## 2012-02-28 NOTE — Progress Notes (Signed)
1409 Post void residual . Pt encouraged to void again with output. Pt. Encouraged to try to urinate every hour

## 2012-02-28 NOTE — Progress Notes (Signed)
Patient ID: Juan Wilkerson, male   DOB: [birth year], 71 y.o.   MRN: [id number] Family Medicine Teaching Service Progress Note [phone]   Subjective: Feels well. Notices bruising on penis and scrotum. His pain is improved. Has some urinary hesitancy and PVR was about 290 yesterday. No blood in stool or urine.  Objective: BP 134/71  Pulse 65  Temp 98.4 F (36.9 C) (Oral)  Resp 18  Ht 6\' 4"  (1.93 m)  Wt 221 lb 5.5 oz (100.4 kg)  BMI 26.94 kg/m2  SpO2 93%  Physical Exam: Gen: NAD.  CV: irregularly irregular. Click at S2 Lungs: CTAB. No wheezing, rhonchi or rales.  Abd: Soft. Tenderness on left flank. No HSM. Marked areas of ecchymosis in pubic area and left flank within boundaries of previous pen markings. Extends to scrotum and shaft of penis. There is trace scrotal edema.  EXT: NT. No erythema or sig LE edema.  Neuro: A/O x3, normal speech.  CBC    Component Value Date/Time   WBC 7.4 02/28/2012 0504   RBC 2.87* 02/28/2012 0504   HGB 8.6* 02/28/2012 0504   HCT 25.4* 02/28/2012 0504   PLT 181 02/28/2012 0504   MCV 88.5 02/28/2012 0504   MCH 30.0 02/28/2012 0504   MCHC 33.9 02/28/2012 0504   RDW 13.9 02/28/2012 0504   LYMPHSABS 0.9 02/21/2012 1520   MONOABS 1.8* 02/21/2012 1520   EOSABS 0.1 02/21/2012 1520   BASOSABS 0.0 02/21/2012 1520    Assessment and Plan: 71 yo male with history of Atrial Fibrillation, Marfan syndrome, HTN, mechanical aortic valve and 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    Retroperitoneal bleed: Was on coumadin for a.fib but transitioned to lovenox for recent ablation procedure. Had procedure on 12/13 and now found to have retroperitoneal bleed on CT. CTA abdomen and pelvis 12/18: Resulted with slight improvement in retroperitoneal bleed and stable cryo site. Initial Hgb in ED was 13.1, down to 9.0 and pt received 1 unit PRBC transfusion on 12/17.   - Hgb down to 8.6 this am from 10.0 yesterday.  Will repeat today and continue to monitor HGB every 12 hour - Urology consult on 12/18, foley removed yesterday with post void residual 290. Will repeat today to determine if he may need foley for obstruction.  Atrial fibrillation:  RVR on admission is resolved s/p diltiazem drip in ED. Cardiology consulted in ED. - Diltiazem 240mg  discontinued in favor of home metoprolol.  -  Metoprolol XR increased to 50mg  qam and 25 mg qhs with improved rate control. -appreciate cards recommendations.   Mechanical aortic valve: Needs anticoagulation goal 2.5-3.5.  Hemoglobin has been waxing/waning between 8.6-10.  - On IV heparin bridge.  -coumadin per pharmacy to continue if hemoglobin remains stable. INR 1.24 today. -If plans for DC, will need lovenox bridge.  HTN:  - Hold amlodipine for now. BP's controlled.  - Increasing metoprolol as above   Renal mass: S/p core biopsy and cryo ablation on 12/13.  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 -f/u post void residual   Lloyd Huger, MD [facility] Resident - PGY-3 02/28/2012 6:36 AM

## 2012-02-28 NOTE — Progress Notes (Signed)
Patient ID: Juan Wilkerson, male   DOB: 05/23/1940, 71 y.o.   MRN: [id number]    SUBJECTIVE:   NO complaints ambulating no palpitations or dyspnea     . metoprolol succinate  25 mg Oral QHS  . metoprolol succinate  50 mg Oral Daily  . polyethylene glycol  17 g Oral Daily  . sodium chloride  3 mL Intravenous Q12H  . Warfarin - Pharmacist Dosing Inpatient   Does not apply q1800  heparin gtt   Filed Vitals:   02/27/12 1200 02/27/12 1615 02/27/12 2100 02/28/12 0500  BP: 116/70 129/74 133/74 134/71  Pulse: 67  69 65  Temp:  98.3 F (36.8 C) 98.6 F (37 C) 98.4 F (36.9 C)  TempSrc:  Oral    Resp: 22 20 18 18   Height:      Weight:      SpO2: 98% 96% 94% 93%    Intake/Output Summary (Last 24 hours) at 02/28/12 0918 Last data filed at 02/28/12 0744  Gross per 24 hour  Intake   1391 ml  Output   2325 ml  Net   -934 ml    CBC:  Basename 02/28/12 0504 02/27/12 1745  WBC 7.4 9.5  NEUTROABS -- --  HGB 8.6* 9.8*  HCT 25.4* 29.0*  MCV 88.5 88.7  PLT 181 221    RADIOLOGY: Ct Abdomen Pelvis W Contrast  02/21/2012  *RADIOLOGY REPORT*  Clinical Data: Flank pain.  Recent left renal mass ablation.  CT ABDOMEN AND PELVIS WITH CONTRAST  Technique:  Multidetector CT imaging of the abdomen and pelvis was performed following the standard protocol during bolus administration of intravenous contrast.  Contrast: OMNIPAQUE IOHEXOL 300 MG/ML  SOLN  Comparison: CT images dated 12/13 and 11/20/2011  Findings: The patient has a large perirenal and retroperitoneal hemorrhage.  The left kidney is superiorly and anteriorly displaced by the hemorrhage.  There is no obstruction of the kidney.  The left upper pole renal mass that was ablated is visible.  There is also blood extending across the midline adjacent to the duodenum and into the right pericolic gutter.  The hematoma extends into the left side of the pelvis and slightly compresses the left side of the bladder.  There is also a small  amount of blood in the  left upper quadrant around the spleen.  Foley catheter in the bladder.  Delayed imaging demonstrates normal excretion of contrast from both kidneys.  Incidental note is made of multiple small cysts in the liver as well as small cysts in both kidneys.  Pancreas and spleen and adrenal glands are normal.  The bowel is normal except for being displaced by the large hematoma.  No acute osseous abnormality.  IMPRESSION:    Large retroperitoneal hemorrhage originating from the upper pole of the left kidney at the site of tumor ablation.  Secondary mass effect upon the kidney and adjacent bowel and bladder.  Critical Value/emergent results were called by telephone at the time of interpretation on 02/21/2012 at  7:21 p.m. to  Dr. Linwood Dibbles and to Dr. Malachy Moan,, who verbally acknowledged these results.   Original Report Authenticated By: Francene Boyers, M.D.    PHYSICAL EXAM  General: NAD. Walking halls with walker.  Neck: JVP 8 no thyromegaly or thyroid nodule.  Lungs: Clear to auscultation bilaterally with normal respiratory effort.  CV: Nondisplaced PMI. irregular S1/S2, no S3/S4, mechanical S2, no murmur. Trace  edema. No carotid bruit. Normal pedal pulses.  Abdomen: Soft, nontender,  no hepatosplenomegaly, no distention.  Neurologic: Alert and oriented x 3.  Psych: Normal affect.  Extremities: No clubbing or cyanosis.   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  1. Atrial flutter: HR a bit labile will increase Toprol XL slightly. Can give some lasix as needed for volume overload. 2. Mechanical aortic valve: Tolerating heparin. Follow Hb seems to have taken a dip today . Coumadin started yesterday per pharmacy    Continue to follow CBC closely.   [name] 02/28/2012 9:18 AM

## 2012-02-28 NOTE — Progress Notes (Signed)
Post residual void-420ml per bladder scan. Encouraged pt to attempt to use urinal again. Pt. Voided, however, spilled urinal in bed with noted 100cc in urinal.

## 2012-02-28 NOTE — Progress Notes (Signed)
ANTICOAGULATION CONSULT NOTE - Follow Up Consult  Pharmacy Consult for Heparin Indication: atrial fibrillation, mechanical aortic valve  No Known Allergies  Patient Measurements: Height: 6\' 4"  (193 cm) Weight: 221 lb 5.5 oz (100.4 kg) IBW/kg (Calculated) : 86.8   Vital Signs: Temp: 98.4 F (36.9 C) (12/22 0500) BP: 132/79 mmHg (12/22 1013) Pulse Rate: 79  (12/22 1013)  Labs:  Basename 02/28/12 1250 02/28/12 0918 02/28/12 0504 02/28/12 0151 02/27/12 1745 02/27/12 0916 02/27/12 0128  HGB -- -- 8.6* -- 9.8* -- --  HCT -- -- 25.4* -- 29.0* 29.8* --  PLT -- -- 181 -- 221 206 --  APTT -- -- -- -- -- -- --  LABPROT -- -- 15.4* 15.9* -- -- 16.6*  INR -- -- 1.24 1.30 -- -- 1.38  HEPARINUNFRC 0.51 -- 0.27* 0.30 -- -- --  CREATININE -- 0.68 -- -- -- -- --  CKTOTAL -- -- -- -- -- -- --  CKMB -- -- -- -- -- -- --  TROPONINI -- -- -- -- -- -- --    Estimated Creatinine Clearance: 102.5 ml/min (by C-G formula based on Cr of 0.68).   Medications:  Scheduled:    . [COMPLETED] furosemide  40 mg Oral Once  . metoprolol succinate  25 mg Oral QHS  . metoprolol succinate  50 mg Oral Daily  . polyethylene glycol  17 g Oral Daily  . sodium chloride  3 mL Intravenous Q12H  . Tamsulosin HCl  0.4 mg Oral Daily  . [COMPLETED] warfarin  3 mg Oral ONCE-1800  . Warfarin - Pharmacist Dosing Inpatient   Does not apply q1800    Assessment: 71 y.o. M admitted 12/15 with retroperitoneal bleed after renal cryoablation procedure 12/13. He is chronically anticoagulated with Coumadin for his mechanical AORTIC valve and atrial fibrillation, and was being bridged with Lovenox around his procedure. Coumadin and Lovenox were held on admission, and he received 1 dose of Coumadin 12/18. Coumadin was discontinued and Heparin bridging was started 12/19.   Coumadin was re-started on 12/21. INR this AM is 1.24 after one dose of 3 mg of warfarin. HL this AM was 0.27 and rate was increased to 2300 units/hr. HL this  afternoon is 0.51. H/H 8.6/25.4 (down slightly, MD aware, monitoring closely for bleeding). Renal function stable  Goal of Therapy:  Heparin level 0.3-0.7 units/ml INR 2.5-3 Monitor platelets by anticoagulation protocol: Yes   Plan:  - Continue heparin infusion at 2300 units/hr - Warfarin 3 mg x 1 at 1800 - Daily CBC/HL - Daily PT/INR - Monitor closely for bleeding  Abran Duke, PharmD Clinical Pharmacist Phone: [phone] Pager: [phone] 02/28/2012 2:13 PM

## 2012-02-28 NOTE — Progress Notes (Signed)
Patient ID: SYNCERE EBLE, male   DOB: 14-Mar-1940, 71 y.o.   MRN: [id number]    Subjective: No new complaints. Hemoglobin has been stable. Foley removed yesterday. He states he does not feel he is emptying his bladder all the way. He voided close to 300cc; bladder scan showed 477cc. He has no discomfort. Reviewed options which include replacing catheter versus double voiding/timed voiding.  ROS: Negative: chest pain or SOB  Objective: Vital signs in last 24 hours: Temp:  [97.6 F (36.4 C)-99.3 F (37.4 C)] 98.4 F (36.9 C) (12/22 0500) Pulse Rate:  [65-69] 65  (12/22 0500) Resp:  [13-27] 18  (12/22 0500) BP: (105-134)/(66-74) 134/71 mmHg (12/22 0500) SpO2:  [93 %-98 %] 93 % (12/22 0500)  Intake/Output from previous day: 12/21 0701 - 12/22 0700 In: 1893 [P.O.:1200; I.V.:693] Out: 3050 [Urine:3050] Intake/Output this shift: Total I/O In: -  Out: 175 [Urine:175]  Physical Exam:  General: Alert and oriented Abdomen: Soft, ND, NT, Minimal CVAT, small left flank ecchymosis unchanged. GU: No foley, voiding yellow urine, genital ecchymosis unchanged.   Lab Results:  Basename 02/28/12 0504 02/27/12 1745 02/27/12 0916  HGB 8.6* 9.8* 10.0*  HCT 25.4* 29.0* 29.8*      Assessment/Plan: -Monitor INR when starting coumadin. -Patient would like to continue with catheter out. If he goes another 24 hours with hight residuals, then catheter will need to be replaced. This should not delay his discharge when ready as he can go home with a catheter and f/u in GU clinic for voiding trial. -Replace catheter today if he becomes uncomfortable.    LOS: 7 days   Milford Cage 02/28/2012, 7:49 AM

## 2012-02-28 NOTE — Progress Notes (Signed)
ANTICOAGULATION CONSULT NOTE - Follow Up Consult  Pharmacy Consult for heparin Indication: AVR/Afib  Labs:  Basename 02/28/12 0504 02/28/12 0151 02/27/12 1745 02/27/12 0916 02/27/12 0128  HGB 8.6* -- 9.8* -- --  HCT 25.4* -- 29.0* 29.8* --  PLT 181 -- 221 206 --  APTT -- -- -- -- --  LABPROT 15.4* 15.9* -- -- 16.6*  INR 1.24 1.30 -- -- 1.38  HEPARINUNFRC 0.27* 0.30 -- 0.53 --  CREATININE -- -- -- -- --  CKTOTAL -- -- -- -- --  CKMB -- -- -- -- --  TROPONINI -- -- -- -- --    Assessment: 71yo male now with heparin levels trending down to subtherapeutic (0.52-->0.3-->0.27); no issues with gtt per RN.  Goal of Therapy:  Heparin level 0.3-0.7 units/ml   Plan:  Will increase heparin gtt by 2 units/kg/hr to 2300 units/hr and check level in 6hr.  Colleen Can PharmD BCPS 02/28/2012,6:24 AM

## 2012-02-28 NOTE — Progress Notes (Signed)
FMTS Attending Daily Note:  Renold Don MD  [phone] pager  Family Practice pager:  [phone] I have seen and examined this patient and have reviewed their chart. I have discussed this patient with the resident. I agree with the resident's findings, assessment and care plan.  Doing well.  Examined bruising in scrotum.  Likely dependent drainage from gravity.  Has not extended beyond borders marked yesterday.  Continue to trend Hgb/INR.  Will likely need to be therapeutic here with stable Hgb before able to DC.

## 2012-02-29 ENCOUNTER — Other Ambulatory Visit: Payer: Self-pay | Admitting: Emergency Medicine

## 2012-02-29 DIAGNOSIS — N2889 Other specified disorders of kidney and ureter: Secondary | ICD-10-CM

## 2012-02-29 LAB — URINALYSIS, ROUTINE W REFLEX MICROSCOPIC
Leukocytes, UA: NEGATIVE
Protein, ur: 30 mg/dL — AB
Urobilinogen, UA: 2 mg/dL — ABNORMAL HIGH (ref 0.0–1.0)

## 2012-02-29 LAB — CBC
MCH: 29.7 pg (ref 26.0–34.0)
MCHC: 33.5 g/dL (ref 30.0–36.0)
MCHC: 34.2 g/dL (ref 30.0–36.0)
MCV: 88.8 fL (ref 78.0–100.0)
Platelets: 212 10*3/uL (ref 150–400)
RDW: 13.9 % (ref 11.5–15.5)
RDW: 13.9 % (ref 11.5–15.5)
WBC: 11.5 10*3/uL — ABNORMAL HIGH (ref 4.0–10.5)

## 2012-02-29 LAB — GRAM STAIN

## 2012-02-29 LAB — BASIC METABOLIC PANEL
CO2: 25 mEq/L (ref 19–32)
Calcium: 8.9 mg/dL (ref 8.4–10.5)
Creatinine, Ser: 0.7 mg/dL (ref 0.50–1.35)
Glucose, Bld: 126 mg/dL — ABNORMAL HIGH (ref 70–99)

## 2012-02-29 LAB — URINE MICROSCOPIC-ADD ON

## 2012-02-29 LAB — HEPARIN LEVEL (UNFRACTIONATED): Heparin Unfractionated: 0.64 IU/mL (ref 0.30–0.70)

## 2012-02-29 MED ORDER — POTASSIUM CHLORIDE CRYS ER 20 MEQ PO TBCR
20.0000 meq | EXTENDED_RELEASE_TABLET | Freq: Two times a day (BID) | ORAL | Status: DC
  Administered 2012-02-29 (×2): 20 meq via ORAL
  Filled 2012-02-29 (×4): qty 1

## 2012-02-29 MED ORDER — WARFARIN SODIUM 3 MG PO TABS
3.0000 mg | ORAL_TABLET | Freq: Once | ORAL | Status: AC
  Administered 2012-02-29: 3 mg via ORAL
  Filled 2012-02-29: qty 1

## 2012-02-29 NOTE — Progress Notes (Signed)
Seen and examined. Discussed with Dr. Claiborne Billings.  Agree with her management and documentation.  Briefly, despite feeling a little worse this morning, this afternoon he feels like he is making slow but steady progress.  No objective evidence of continued bleeding even though we have restarted heparin.  Will continue to restart anticoag and watch for any evidence of bleeding.  Also, we needed to restart foley for continued high Post void residuals.  Uro on board and we are following their recs.

## 2012-02-29 NOTE — Progress Notes (Signed)
Family Practice Teaching Service Interval Progress Note  Stopped by patient's room earlier (around 3:15pm) to check on him after getting call earlier from RN that pt was briefly tachycardic and complaining of shortness of breath. Pt was resting comfortably in his bed. Denied chest pain or shortness of breath to me.   Exam: Gen: NAD, resting comfortably Heart: RRR, mechanical valve audible Lungs: CTAB, diminished air movement throughout especially at bases, normal respiratory effort Abd: mildly distended, nontender to light palpation GU: foley in place  Will continue to follow and monitor pt on telemetry.  Levert Feinstein, MD Family Medicine PGY-1 Pager [phone]

## 2012-02-29 NOTE — Progress Notes (Signed)
Patient ID: Juan Wilkerson, male   DOB: [birth year], 71 y.o.   MRN: [id number] [facility] Medicine Teaching Service PGY-1 Progress Note   Overnight Events: Patient is not feeling well today. He is complaining of increased pressure on his left flank area and around to his back. He is able to tolerate meals well, and his last BM was yesterday morning and was loose. He is able to ambulate well.  Objective: Temp:  [98.1 F (36.7 C)-99.4 F (37.4 C)] 98.5 F (36.9 C) (12/23 0500) Pulse Rate:  [53-110] 53  (12/23 0500) Cardiac Rhythm:  [-] Atrial flutter (12/23 0806) Resp:  [18] 18  (12/23 0500) BP: (125-164)/(73-91) 142/91 mmHg (12/23 0500) SpO2:  [92 %-96 %] 96 % (12/23 0500) Weight change:   BP 142/91  Pulse 53  Temp 98.5 F (36.9 C) (Oral)  Resp 18  Ht 6\' 4"  (1.93 m)  Wt 221 lb 5.5 oz (100.4 kg)  BMI 26.94 kg/m2  SpO2 96%  Physical Exam: Gen: NAD.  CV: irregularly irregular. Mechanical heart valve appreciated. Lungs: CTAB. No wheezing, rhonchi or rales.  Abd: Soft. Mild tenderness on left flank. No HSM. Marked areas of ecchymosis in pubic area and left flank, extends to scrotum and shaft of penis. There is trace scrotal edema, ecchymosis appears to be increasing on his left flank.   EXT: NT. No erythema or sig LE edema.  Neuro: A/O x3, normal speech.   CBC    Component Value Date/Time   WBC 11.5* 02/29/2012 0530   RBC 3.03* 02/29/2012 0530   HGB 9.0* 02/29/2012 0530   HCT 26.9* 02/29/2012 0530   PLT 212 02/29/2012 0530   MCV 88.8 02/29/2012 0530   MCH 29.7 02/29/2012 0530   MCHC 33.5 02/29/2012 0530   RDW 13.9 02/29/2012 0530   LYMPHSABS 0.9 02/21/2012 1520   MONOABS 1.8* 02/21/2012 1520   EOSABS 0.1 02/21/2012 1520   BASOSABS 0.0 02/21/2012 1520     BMET    Component Value Date/Time   NA 134* 02/29/2012 0530   K 3.3* 02/29/2012 0530   CL 99 02/29/2012 0530   CO2 25 02/29/2012 0530   GLUCOSE 126* 02/29/2012 0530   BUN 13 02/29/2012 0530   CREATININE 0.70  02/29/2012 0530   CREATININE 0.79 08/15/2010 0845   CALCIUM 8.9 02/29/2012 0530   GFRNONAA >90 02/29/2012 0530   GFRAA >90 02/29/2012 0530    Assessment and Plan: 71 yo male with history of Atrial Fibrillation, Marfan syndrome, HTN, mechanical aortic valve and 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   Retroperitoneal bleed: Was on coumadin for a.fib but transitioned to lovenox for recent ablation procedure. Had procedure on 12/13 and now found to have retroperitoneal bleed on CT. CTA abdomen and pelvis 12/18: Resulted with slight improvement in retroperitoneal bleed and stable cryo site. Initial Hgb in ED was 13.1, down to 9.0 and pt received 1 unit PRBC transfusion on 12/17.  - HGB has been stable, today at 9.1.  Will repeat today and continue to monitor HGB  - His pain level has increased today, along with pressure and ecchymosis increase, concern for re-bleed, will monitor HGB every 8 hours and CT scan if patient HGB drops.  - Urology consult on 12/18, foley removed Saturday and has constant post void residual. Patient has been encourage to urinate hourly.   Atrial fibrillation: RVR on admission is resolved s/p diltiazem drip in ED. Cardiology consulted in ED.  - Diltiazem 240mg   discontinued in favor of home metoprolol.  - Metoprolol XR increased to 50mg  qam and 25 mg qhs with improved rate control.  -appreciate cards recommendations.   Mechanical aortic valve: Needs anticoagulation goal 2.5-3.5. Hemoglobin has been waxing/waning between 8.6-10.  - On IV heparin bridge.  -coumadin per pharmacy to continue if hemoglobin remains stable. INR 1.38 today.  - Will remain inpatient until therapeutic    HTN:  - Hold amlodipine for now. BP's controlled.  - Increasing metoprolol as above   Renal mass: S/p core biopsy and cryo ablation on 12/13.  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 No antibiotics have been administered to consider C.diff.    FEN/GI: Heart healthy diet - KDUR 20mg  BID x4 doses today for potassium repletion (3.3) PPx:  -currently receiving IV heparin bridge  Dispo:  -Pending continued stability of hemoglobin, transition to oral coumadin to therapeutic levels  -f/u post void residual

## 2012-02-29 NOTE — Progress Notes (Signed)
Pt's post residual was 387 around 2200. Pt encouraged to void every hour. Will continue to monitor the pt. Sanda Linger

## 2012-02-29 NOTE — Progress Notes (Addendum)
Patient c/o intense pressure in lower abdominal area.  Patient having difficulty completely emptying bladder while voiding.  Post void residual obtained and showed greater than 300 ml in bladder.  MD paged and will continue to monitor. Nolon Nations  13:21-Patient's HR sustaining in the 110-120's and pt SOB.  Patient instructed to lay in bed and rest and HR decreased to the mid-60's.  MD notified.  Will continue to monitor. Nolon Nations

## 2012-02-29 NOTE — Progress Notes (Signed)
ANTICOAGULATION CONSULT NOTE - Follow Up Consult  Pharmacy Consult for Heparin Indication: atrial fibrillation, mechanical aortic valve  No Known Allergies  Labs:  Basename 02/29/12 0530 02/28/12 1839 02/28/12 1250 02/28/12 0918 02/28/12 0504 02/28/12 0151  HGB 9.0* 9.1* -- -- -- --  HCT 26.9* 26.8* -- -- 25.4* --  PLT 212 212 -- -- 181 --  APTT -- -- -- -- -- --  LABPROT 16.6* -- -- -- 15.4* 15.9*  INR 1.38 -- -- -- 1.24 1.30  HEPARINUNFRC 0.64 -- 0.51 -- 0.27* --  CREATININE 0.70 -- -- 0.68 -- --  CKTOTAL -- -- -- -- -- --  CKMB -- -- -- -- -- --  TROPONINI -- -- -- -- -- --    Estimated Creatinine Clearance: 102.5 ml/min (by C-G formula based on Cr of 0.7).   Assessment: 71 y.o. M admitted 12/15 with retroperitoneal bleed after renal cryoablation procedure 12/13. He is chronically anticoagulated with Coumadin for his mechanical AORTIC valve and atrial fibrillation, and was being bridged with Lovenox around his procedure. Coumadin and Lovenox were held on admission, and he received 1 dose of Coumadin 12/18. Coumadin was discontinued and Heparin bridging was started 12/19.   Coumadin was re-started on 12/21. INR this AM is 1.38 (trending up).  Heparin level therapeutic.  CBC stable  Goal of Therapy:  Heparin level 0.3-0.7 units/ml INR 2.5-3 Monitor platelets by anticoagulation protocol: Yes   Plan:  - Continue heparin infusion at 2300 units/hr - Warfarin 3 mg x 1 at 1800 - Daily CBC/HL - Daily PT/INR - Monitor closely for bleeding  Thank you. Okey Regal, PharmD [phone]  02/29/2012 9:10 AM

## 2012-02-29 NOTE — Progress Notes (Signed)
Patient ID: Juan Wilkerson, male   DOB: December 08, 1940, 71 y.o.   MRN: [id number]    Subjective: Pt with increased left flank pain this morning.  Now improved.  Difficulty voiding persisted with increased PVRs.  Foley catheter placed this afternoon.  Now resting comfortably.  Objective: Vital signs in last 24 hours: Temp:  [98 F (36.7 C)-99.4 F (37.4 C)] 98 F (36.7 C) (12/23 1417) Pulse Rate:  [53-110] 64  (12/23 1417) Resp:  [18] 18  (12/23 1417) BP: (125-164)/(73-92) 136/76 mmHg (12/23 1417) SpO2:  [96 %-100 %] 100 % (12/23 1417)  Intake/Output from previous day: 12/22 0701 - 12/23 0700 In: 53 [P.O.:50; I.V.:3] Out: 1875 [Urine:1875] Intake/Output this shift: Total I/O In: 230 [I.V.:230] Out: 700 [Urine:700]  Physical Exam:  General: Alert and oriented Abdomen: Soft, ND, Minimal CVAT, Ecchymosis along left flank and LLQ  Lab Results:  [facility] 02/29/12 1753 02/29/12 0530 02/28/12 1839  HGB 9.3* 9.0* 9.1*  HCT 27.2* 26.9* 26.8*   BMET  Basename 02/29/12 0530 02/28/12 0918  NA 134* 134*  K 3.3* 3.3*  CL 99 99  CO2 25 25  GLUCOSE 126* 137*  BUN 13 14  CREATININE 0.70 0.68  CALCIUM 8.9 8.7   Lab Results  Component Value Date   INR 1.38 02/29/2012   INR 1.24 02/28/2012   INR 1.30 02/28/2012     Studies/Results: No results found.  Assessment/Plan: 1) Perinephric hematoma s/p percutaneous cryoablation: Hgb stable. Ecchymosis may be due to recent acute bleed but does not necessarily mean he is actively bleeding now.  Continue to monitor Hgb and continue Coumadin. 2) Urinary retention: Continue catheter.  Can start tamsulosin 0.4 mg and may be able to undergo a voiding trial in the hospital before discharge.  Otherwise, will need outpatient followup for voiding trial.   LOS: 8 days   Neill Jurewicz,LES 02/29/2012, 6:36 PM

## 2012-02-29 NOTE — Progress Notes (Signed)
PT Cancellation Note  Patient Details Name: Juan Wilkerson MRN: [id number] DOB: 06-Jan-1941   Cancelled Treatment:    Reason Eval/Treat Not Completed: Medical issues which prohibited therapy (Pt with sustained HR in 110-120s this pm.) Pt with current instructions to stay in bed and rest. HR currently noted to be 60s-70s with pt supine resting. Will follow as able.   Lexus Barletta M 02/29/2012, 2:16 PM  02/29/2012 Cephus Shelling, PT, DPT [phone]

## 2012-02-29 NOTE — Progress Notes (Signed)
Patient ID: Juan Wilkerson, male   DOB: [birth year], 71 y.o.   MRN: [id number]    SUBJECTIVE:   NO complaints ambulating no palpitations or dyspnea  Post void residual a bit high     . metoprolol succinate  25 mg Oral QHS  . metoprolol succinate  50 mg Oral Daily  . polyethylene glycol  17 g Oral Daily  . sodium chloride  3 mL Intravenous Q12H  . Tamsulosin HCl  0.4 mg Oral Daily  . Warfarin - Pharmacist Dosing Inpatient   Does not apply q1800  heparin gtt   Filed Vitals:   02/28/12 1400 02/28/12 2100 02/28/12 2240 02/29/12 0500  BP: 125/81 125/73 164/77 142/91  Pulse: 80 65 110 53  Temp: 98.1 F (36.7 C) 99.4 F (37.4 C)  98.5 F (36.9 C)  TempSrc:  Oral  Oral  Resp: 18 18  18   Height:      Weight:      SpO2: 92% 96%  96%    Intake/Output Summary (Last 24 hours) at 02/29/12 0743 Last data filed at 02/29/12 0320  Gross per 24 hour  Intake     53 ml  Output   1875 ml  Net  -1822 ml    CBC:  Basename 02/29/12 0530 02/28/12 1839  WBC 11.5* 8.4  NEUTROABS -- --  HGB 9.0* 9.1*  HCT 26.9* 26.8*  MCV 88.8 88.4  PLT 212 212    RADIOLOGY: Ct Abdomen Pelvis W Contrast  02/21/2012  *RADIOLOGY REPORT*  Clinical Data: Flank pain.  Recent left renal mass ablation.  CT ABDOMEN AND PELVIS WITH CONTRAST  Technique:  Multidetector CT imaging of the abdomen and pelvis was performed following the standard protocol during bolus administration of intravenous contrast.  Contrast: OMNIPAQUE IOHEXOL 300 MG/ML  SOLN  Comparison: CT images dated 12/13 and 11/20/2011  Findings: The patient has a large perirenal and retroperitoneal hemorrhage.  The left kidney is superiorly and anteriorly displaced by the hemorrhage.  There is no obstruction of the kidney.  The left upper pole renal mass that was ablated is visible.  There is also blood extending across the midline adjacent to the duodenum and into the right pericolic gutter.  The hematoma extends into the left side of the pelvis and  slightly compresses the left side of the bladder.  There is also a small amount of blood in the  left upper quadrant around the spleen.  Foley catheter in the bladder.  Delayed imaging demonstrates normal excretion of contrast from both kidneys.  Incidental note is made of multiple small cysts in the liver as well as small cysts in both kidneys.  Pancreas and spleen and adrenal glands are normal.  The bowel is normal except for being displaced by the large hematoma.  No acute osseous abnormality.  IMPRESSION:    Large retroperitoneal hemorrhage originating from the upper pole of the left kidney at the site of tumor ablation.  Secondary mass effect upon the kidney and adjacent bowel and bladder.  Critical Value/emergent results were called by telephone at the time of interpretation on 02/21/2012 at  7:21 p.m. to  Dr. Linwood Dibbles and to Dr. Malachy Moan,, who verbally acknowledged these results.   Original Report Authenticated By: Francene Boyers, M.D.    PHYSICAL EXAM  General: NAD. Walking halls with walker.  Neck: JVP 8 no thyromegaly or thyroid nodule.  Lungs: Clear to auscultation bilaterally with normal respiratory effort.  CV: Nondisplaced PMI. irregular S1/S2, no  S3/S4, mechanical S2, no murmur. Trace  edema. No carotid bruit. Normal pedal pulses.  Abdomen: Soft, nontender, no hepatosplenomegaly, no distention.  Neurologic: Alert and oriented x 3.  Psych: Normal affect.  Extremities: No clubbing or cyanosis.   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  1. Atrial flutter: stable toprol increased yesterday 2. Mechanical aortic valve: Tolerating heparin. Hct stable about 26  Normal valve sounds  INR 1.36 continue heparin.    [name] 02/29/2012 7:43 AM

## 2012-03-01 LAB — CBC
HCT: 25.4 % — ABNORMAL LOW (ref 39.0–52.0)
MCH: 30.1 pg (ref 26.0–34.0)
MCHC: 34.3 g/dL (ref 30.0–36.0)
MCV: 87.9 fL (ref 78.0–100.0)
RDW: 14.1 % (ref 11.5–15.5)

## 2012-03-01 LAB — HEPARIN LEVEL (UNFRACTIONATED): Heparin Unfractionated: 0.69 IU/mL (ref 0.30–0.70)

## 2012-03-01 LAB — URINE CULTURE: Culture: NO GROWTH

## 2012-03-01 MED ORDER — POTASSIUM CHLORIDE CRYS ER 20 MEQ PO TBCR
20.0000 meq | EXTENDED_RELEASE_TABLET | Freq: Two times a day (BID) | ORAL | Status: DC
  Administered 2012-03-01 – 2012-03-03 (×5): 20 meq via ORAL
  Filled 2012-03-01 (×5): qty 1

## 2012-03-01 MED ORDER — WARFARIN SODIUM 2.5 MG PO TABS
2.5000 mg | ORAL_TABLET | Freq: Once | ORAL | Status: AC
  Administered 2012-03-01: 2.5 mg via ORAL
  Filled 2012-03-01: qty 1

## 2012-03-01 NOTE — Progress Notes (Signed)
Seen and examined.  Discussed with Dr. Claiborne Billings.  Agree with her documentation and management.  He has no evidence of continued bleeding despite restarting anticoag.  Plan is to DC home on coumadin when therapeutic INR.  Also, needs to ambulate.  Instructed PT to work with him despite his heart rate >100 with activity.

## 2012-03-01 NOTE — Progress Notes (Signed)
Physical Therapy Treatment Patient Details Name: Juan Wilkerson MRN: [id number] DOB: May 04, 1940 Today's Date: 03/01/2012 Time: 1478-2956 PT Time Calculation (min): 20 min  PT Assessment / Plan / Recommendation Comments on Treatment Session  Pt admitted with retroperitineal bleed and continues to progress with therapy. Pt motivated and able to tolerate slight ambulation distance with therapy this pm.    Follow Up Recommendations  No PT follow up     Does the patient have the potential to tolerate intense rehabilitation     Barriers to Discharge        Equipment Recommendations  None recommended by PT    Recommendations for Other Services    Frequency Min 3X/week   Plan Discharge plan remains appropriate;Frequency remains appropriate    Precautions / Restrictions Precautions Precautions: Fall Restrictions Weight Bearing Restrictions: No   Pertinent Vitals/Pain None    Mobility  Bed Mobility Bed Mobility: Supine to Sit;Sit to Supine Supine to Sit: 6: Modified independent (Device/Increase time) Sit to Supine: 6: Modified independent (Device/Increase time) Transfers Transfers: Sit to Stand;Stand to Sit Sit to Stand: 6: Modified independent (Device/Increase time) Stand to Sit: 6: Modified independent (Device/Increase time) Ambulation/Gait Ambulation/Gait Assistance: 5: Supervision Ambulation Distance (Feet): 200 Feet Assistive device: None Ambulation/Gait Assistance Details: Verbal cues for safety and tall posture as well as pursed lip breathing to slow DOE. Gait Pattern: Step-through pattern;Decreased stride length;Trunk flexed Gait velocity: WFL Stairs: No Wheelchair Mobility Wheelchair Mobility: No    Exercises     PT Diagnosis:    PT Problem List:   PT Treatment Interventions:     PT Goals Acute Rehab PT Goals PT Goal Formulation: With patient Time For Goal Achievement: 03/04/12 Potential to Achieve Goals: Good PT Goal: Supine/Side to Sit - Progress: Met PT  Goal: Sit to Supine/Side - Progress: Met PT Goal: Ambulate - Progress: Progressing toward goal  Visit Information  Last PT Received On: 03/01/12 Assistance Needed: +1    Subjective Data  Subjective: "I just got done going through all my stuff, but I can do a little." Patient Stated Goal: return home   Cognition  Overall Cognitive Status: Appears within functional limits for tasks assessed/performed Arousal/Alertness: Awake/alert Orientation Level: Appears intact for tasks assessed Behavior During Session: [facility] for tasks performed    Balance  Balance Balance Assessed: No  End of Session PT - End of Session Activity Tolerance: Patient tolerated treatment well Patient left: in bed;with call bell/phone within reach Nurse Communication: Mobility status   GP     Cephus Shelling 03/01/2012, 1:33 PM  03/01/2012 Cephus Shelling, PT, DPT [phone]

## 2012-03-01 NOTE — Progress Notes (Signed)
  Subjective: Left renal cryoablation performed in IR 02/19/12 restarted coumadin 1 day later and developed RP bleed Now is stable and back on coumadin/hep both INR 1.9 today Pt experiencing urinary voiding issues Plan per Urology  Objective: Vital signs in last 24 hours: Temp:  [98 F (36.7 C)-98.6 F (37 C)] 98.6 F (37 C) (12/24 0626) Pulse Rate:  [64-84] 65  (12/24 0626) Resp:  [18] 18  (12/23 2035) BP: (134-153)/(69-92) 134/69 mmHg (12/24 0626) SpO2:  [93 %-100 %] 93 % (12/24 0626) Weight:  [210 lb (95.255 kg)] 210 lb (95.255 kg) (12/24 0626) Last BM Date: 02/29/12  Intake/Output from previous day: 12/23 0701 - 12/24 0700 In: 230 [I.V.:230] Out: 1200 [Urine:1200] Intake/Output this shift:    PE:  Afeb; vss Wbc 12.9 Hg: 8.7 Hc: 25.4 INR 1.9 Pt resting; good spirits   Lab Results:   [facility] 03/01/12 0525 02/29/12 1753  WBC 12.9* 14.2*  HGB 8.7* 9.3*  HCT 25.4* 27.2*  PLT 230 240   BMET  Basename 02/29/12 0530 02/28/12 0918  NA 134* 134*  K 3.3* 3.3*  CL 99 99  CO2 25 25  GLUCOSE 126* 137*  BUN 13 14  CREATININE 0.70 0.68  CALCIUM 8.9 8.7   PT/INR  Basename 03/01/12 0525 02/29/12 0530  LABPROT 21.1* 16.6*  INR 1.90* 1.38   ABG No results found for this basename: PHART:2,PCO2:2,PO2:2,HCO3:2 in the last 72 hours  Studies/Results: No results found.  Anti-infectives: Anti-infectives    None      Assessment/Plan: s/p L renal lesion cryoablation 02/19/12 RP bleed post procedure Stable Back on coumadin Plan per Uro Will follow with Dr Fredia Sorrow 2 weeks post cryoablation scheduler will call pt with time and date   LOS: 9 days    Jasiyah Paulding A 03/01/2012

## 2012-03-01 NOTE — Progress Notes (Signed)
Patient ID: Juan Wilkerson, male   DOB: [birth year], 71 y.o.   MRN: [id number]    Subjective: On tamsulosin.  Catheter in place.  Objective: Vital signs in last 24 hours: Temp:  [98 F (36.7 C)-98.6 F (37 C)] 98.6 F (37 C) (12/24 0626) Pulse Rate:  [64-84] 65  (12/24 0626) Resp:  [18] 18  (12/23 2035) BP: (134-153)/(69-92) 134/69 mmHg (12/24 0626) SpO2:  [93 %-100 %] 93 % (12/24 0626) Weight:  [95.255 kg (210 lb)] 95.255 kg (210 lb) (12/24 0626)  Intake/Output from previous day: 12/23 0701 - 12/24 0700 In: 230 [I.V.:230] Out: 700 [Urine:700] Intake/Output this shift:    Lab Results:  Basename 03/01/12 0525 02/29/12 1753 02/29/12 0530  HGB 8.7* 9.3* 9.0*  HCT 25.4* 27.2* 26.9*   BMET  Basename 02/29/12 0530 02/28/12 0918  NA 134* 134*  K 3.3* 3.3*  CL 99 99  CO2 25 25  GLUCOSE 126* 137*  BUN 13 14  CREATININE 0.70 0.68  CALCIUM 8.9 8.7   Lab Results  Component Value Date   INR 1.90* 03/01/2012   INR 1.38 02/29/2012   INR 1.24 02/28/2012    Studies/Results: No results found.  Assessment/Plan: 1) Perinephric hematoma s/p percutaneous cryoablation: Hgb stable. INR increasing but remains subtherapeutic. Once INR is therapeutic, if patient remains stable for 24 hrs, would be ok to d/c home from urologic standpoint. 2) Urinary retention: On tamsulosin 0.4 mg. Would be appropriate to consider voiding trial tomorrow morning.  If unable to void well (PVR > 350cc), would replace catheter and d/c home with plans for outpatient voiding trial at [facility].     LOS: 9 days   Dezirea Mccollister,LES 03/01/2012, 6:28 AM

## 2012-03-01 NOTE — Progress Notes (Signed)
Patient ID: Juan Wilkerson, male   DOB: March 31, 1940, 71 y.o.   MRN: [id number] [facility] Medicine Teaching Service PGY-1 Progress Note   Overnight Events: Patient is overall doing well today. He reports his pain is better today than yesterday. He is still having loose stools today. He understands he will be here for at least 2-3 more days. He would like to get up an walk more frequently, at least twice a day to keep his strength. He does have stairs at home, in order to get to his bathroom. Objective: Temp:  [98 F (36.7 C)-98.6 F (37 C)] 98.6 F (37 C) (12/24 0626) Pulse Rate:  [64-84] 65  (12/24 0626) Cardiac Rhythm:  [-] Atrial flutter (12/23 2015) Resp:  [18] 18  (12/23 2035) BP: (134-153)/(69-92) 134/69 mmHg (12/24 0626) SpO2:  [93 %-100 %] 93 % (12/24 0626) Weight:  [210 lb (95.255 kg)] 210 lb (95.255 kg) (12/24 0626) Weight change:   BP 134/69  Pulse 65  Temp 98.6 F (37 C) (Oral)  Resp 18  Ht 6\' 4"  (1.93 m)  Wt 210 lb (95.255 kg)  BMI 25.56 kg/m2  SpO2 93%  Physical Exam: Gen: NAD. Afebrile. CV: irregularly irregular. Mechanical heart valve appreciated.  Lungs: CTAB. No wheezing, rhonchi or rales.  Abd: Soft. Mild tenderness on left flank. No HSM. Marked areas of ecchymosis in pubic area and left flank, extends to scrotum and shaft of penis are stable.  EXT: NT. No erythema, trace edema bilateral LE Neuro: A/O x3, normal speech.   CBC    Component Value Date/Time   WBC 12.9* 03/01/2012 0525   RBC 2.89* 03/01/2012 0525   HGB 8.7* 03/01/2012 0525   HCT 25.4* 03/01/2012 0525   PLT 230 03/01/2012 0525   MCV 87.9 03/01/2012 0525   MCH 30.1 03/01/2012 0525   MCHC 34.3 03/01/2012 0525   RDW 14.1 03/01/2012 0525   LYMPHSABS 0.9 02/21/2012 1520   MONOABS 1.8* 02/21/2012 1520   EOSABS 0.1 02/21/2012 1520   BASOSABS 0.0 02/21/2012 1520    Assessment and Plan: 71 yo male with history of Atrial Fibrillation, Marfan syndrome, HTN, mechanical aortic valve and Renal neoplasm  s/p biopsy with cryo ablation on 02/19/12 here with large retroperitoneal bleed.   Retroperitoneal bleed: Was on coumadin for a.fib but transitioned to lovenox for recent ablation procedure. Had procedure on 12/13 and now found to have retroperitoneal bleed on CT. CTA abdomen and pelvis 12/18: Resulted with slight improvement in retroperitoneal bleed and stable cryo site. Initial Hgb in ED was 13.1, down to 9.0 and pt received 1 unit PRBC transfusion on 12/17.  - HGB has been stable, today at 9.3.  - Urology consult on 12/18, foley removed Saturday and has constant post void residual >250. Foley was reinserted 12/23 and will be followed up outpatient with urology.   Atrial fibrillation: RVR on admission is resolved s/p diltiazem drip in ED. Cardiology consulted in ED.  - Diltiazem 240mg  discontinued in favor of home metoprolol.  - Metoprolol XR increased to 50mg  qam and 25 mg qhs with improved rate control. Patient continues to have tachycardia in spurts with increased doses of metoprolol that self-resolve.  -appreciate cards recommendations.   Mechanical aortic valve: Needs anticoagulation goal 2.5-3.5. Hemoglobin has been waxing/waning between 8.6-10.  - On IV heparin bridge.  -coumadin per pharmacy to continue if hemoglobin remains stable. INR 1.9 today.  - Will remain inpatient until therapeutic   HTN:  - Hold amlodipine for now. BP's controlled.  Renal mass: S/p core biopsy and cryo ablation on 12/13.  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 No antibiotics have been administered to consider C.diff.   FEN/GI: Heart healthy diet  - KDUR 20mg  BID  today for potassium repletion (3.3) --> total of 8 doses - Stopped miralax PRN PPx:  -currently receiving IV heparin bridge  Dispo:  -Pending continued stability  of hemoglobin, transition to oral coumadin to therapeutic levels  -f/u post void residual outpatient - Need to ambulate today, even if HR is increased.

## 2012-03-01 NOTE — Progress Notes (Signed)
ANTICOAGULATION CONSULT NOTE - Follow Up Consult  Pharmacy Consult for Heparin Indication: atrial fibrillation, mechanical aortic valve  No Known Allergies  Labs:  Basename 03/01/12 0525 02/29/12 1753 02/29/12 0530 02/28/12 1250 02/28/12 0918 02/28/12 0504  HGB 8.7* 9.3* -- -- -- --  HCT 25.4* 27.2* 26.9* -- -- --  PLT 230 240 212 -- -- --  APTT -- -- -- -- -- --  LABPROT 21.1* -- 16.6* -- -- 15.4*  INR 1.90* -- 1.38 -- -- 1.24  HEPARINUNFRC 0.69 -- 0.64 0.51 -- --  CREATININE -- -- 0.70 -- 0.68 --  CKTOTAL -- -- -- -- -- --  CKMB -- -- -- -- -- --  TROPONINI -- -- -- -- -- --    Estimated Creatinine Clearance: 102.5 ml/min (by C-G formula based on Cr of 0.7).   Assessment: 71 y.o. M admitted 12/15 with retroperitoneal bleed after renal cryoablation procedure 12/13. He is chronically anticoagulated with Coumadin for his mechanical AORTIC valve and atrial fibrillation, and was being bridged with Lovenox around his procedure. Coumadin and Lovenox were held on admission, and he received 1 dose of Coumadin 12/18. Coumadin was discontinued and Heparin bridging was started 12/19.   Coumadin was re-started on 12/21. INR this AM is 1.90 (trending up).  Heparin level therapeutic.  CBC trending down  Goal of Therapy:  Heparin level 0.3-0.7 units/ml INR 2.5-3 Monitor platelets by anticoagulation protocol: Yes   Plan:  - Continue heparin infusion at 2300 units/hr - Warfarin 2.5 mg x 1 at 1800 - Daily CBC/HL - Daily PT/INR - Monitor closely for bleeding  Thank you. Okey Regal, PharmD [phone]  03/01/2012 8:57 AM

## 2012-03-02 DIAGNOSIS — D649 Anemia, unspecified: Secondary | ICD-10-CM

## 2012-03-02 LAB — CBC
HCT: 26.5 % — ABNORMAL LOW (ref 39.0–52.0)
Hemoglobin: 9 g/dL — ABNORMAL LOW (ref 13.0–17.0)
MCH: 30 pg (ref 26.0–34.0)
MCHC: 34 g/dL (ref 30.0–36.0)
MCV: 88.3 fL (ref 78.0–100.0)
RBC: 3 MIL/uL — ABNORMAL LOW (ref 4.22–5.81)

## 2012-03-02 MED ORDER — WARFARIN SODIUM 3 MG PO TABS
3.0000 mg | ORAL_TABLET | Freq: Once | ORAL | Status: AC
  Administered 2012-03-02: 3 mg via ORAL
  Filled 2012-03-02: qty 1

## 2012-03-02 NOTE — Progress Notes (Signed)
ANTICOAGULATION CONSULT NOTE - Follow Up Consult  Pharmacy Consult for Heparin Indication: atrial fibrillation, mechanical aortic valve  No Known Allergies  Labs:  Basename 03/02/12 0445 03/01/12 0525 02/29/12 1753 02/29/12 0530 02/28/12 0918  HGB 9.0* 8.7* -- -- --  HCT 26.5* 25.4* 27.2* -- --  PLT 249 230 240 -- --  APTT -- -- -- -- --  LABPROT 22.1* 21.1* -- 16.6* --  INR 2.03* 1.90* -- 1.38 --  HEPARINUNFRC 0.55 0.69 -- 0.64 --  CREATININE -- -- -- 0.70 0.68  CKTOTAL -- -- -- -- --  CKMB -- -- -- -- --  TROPONINI -- -- -- -- --    Estimated Creatinine Clearance: 102.5 ml/min (by C-G formula based on Cr of 0.7).   Assessment: 71 y.o. M admitted 12/15 with retroperitoneal bleed after renal cryoablation procedure 12/13. He is chronically anticoagulated with Coumadin for his mechanical AORTIC valve and atrial fibrillation, and was being bridged with Lovenox around his procedure. Coumadin and Lovenox were held on admission, and he received 1 dose of Coumadin 12/18. Coumadin was discontinued and Heparin bridging was started 12/19.   Coumadin was re-started on 12/21. INR this AM is 2.03 (trending up).  Heparin level therapeutic.  CBC trending down  Goal of Therapy:  Heparin level 0.3-0.7 units/ml INR 2.5-3 Monitor platelets by anticoagulation protocol: Yes   Plan:  - Continue heparin infusion at 2300 units/hr - Warfarin 3 mg x 1 at 1800 - Daily CBC/HL - Daily PT/INR - Monitor closely for bleeding  Thank you. Okey Regal, PharmD [phone]  03/02/2012 8:13 AM

## 2012-03-02 NOTE — Progress Notes (Signed)
Patient ID: Juan Wilkerson, male   DOB: [birth year], 71 y.o.   MRN: [id number] Family Medicine Teaching Service Progress Note   Overnight Events: He reports doing well this morning.  Does still have some pain in the left flank, but much improved.  Walked with PT yesterday.  He would like to go home today.  Discussed with him that ideally he would be here until his INR is 2.5-3.5.  He understands this and states he would be able to follow up at [facility] with Kendal Hymen tomorrow for an INR check.  Objective: Temp:  [97.5 F (36.4 C)-99 F (37.2 C)] 99 F (37.2 C) (12/25 0733) Pulse Rate:  [60-98] 67  (12/25 0733) Cardiac Rhythm:  [-] Atrial flutter;Atrial fibrillation (12/25 0920) Resp:  [20] 20  (12/24 1300) BP: (108-139)/(74-94) 132/74 mmHg (12/25 0733) SpO2:  [94 %-98 %] 94 % (12/25 0733) Weight:  [210 lb (95.255 kg)] 210 lb (95.255 kg) (12/25 0733) Weight change:   BP 132/74  Pulse 67  Temp 99 F (37.2 C) (Oral)  Resp 20  Ht 6\' 4"  (1.93 m)  Wt 210 lb (95.255 kg)  BMI 25.56 kg/m2  SpO2 94%  Physical Exam: Gen: NAD. Afebrile. CV: mild tachycardia, prominent S2.  Lungs: CTAB. No wheezing, rhonchi or rales.  Abd: Soft. Mild tenderness on left flank. Fading areas of ecchymosis in pubic area and left flank, extends to scrotum and shaft of penis are stable.  EXT: NT. No erythema, trace edema bilateral LE Neuro: A/O x3, normal speech.   CBC    Component Value Date/Time   WBC 11.4* 03/02/2012 0445   RBC 3.00* 03/02/2012 0445   HGB 9.0* 03/02/2012 0445   HCT 26.5* 03/02/2012 0445   PLT 249 03/02/2012 0445   MCV 88.3 03/02/2012 0445   MCH 30.0 03/02/2012 0445   MCHC 34.0 03/02/2012 0445   RDW 14.5 03/02/2012 0445   LYMPHSABS 0.9 02/21/2012 1520   MONOABS 1.8* 02/21/2012 1520   EOSABS 0.1 02/21/2012 1520   BASOSABS 0.0 02/21/2012 1520   INR: 2.03  Assessment and Plan: 71 yo male with history of Atrial Fibrillation, Marfan syndrome, HTN, mechanical aortic valve and Renal neoplasm  s/p biopsy with cryo ablation on 02/19/12 here with large retroperitoneal bleed.   Retroperitoneal bleed: Was on coumadin for a.fib but transitioned to lovenox for recent ablation procedure. Had procedure on 12/13 and now found to have retroperitoneal bleed on CT. CTA abdomen and pelvis 12/18: Resulted with slight improvement in retroperitoneal bleed and stable cryo site. Initial Hgb in ED was 13.1, down to 9.0 and pt received 1 unit PRBC transfusion on 12/17.  - HGB has been stable around 9  Urinary retention: Had multiple post void residuals >250 after Foley removal on 12/21.  Foley replaced and started on Flomax. - appreciate urology recommendations - continue Flomax - will do voiding trial today  Atrial fibrillation: RVR on admission is resolved s/p diltiazem drip in ED. Cardiology consulted in ED.  - Diltiazem 240mg  discontinued in favor of home metoprolol.  - Metoprolol XR increased to 50mg  qam and 25 mg qhs with improved rate control. Patient continues to have tachycardia in spurts with increased doses of metoprolol that self-resolve.  -appreciate cards recommendations.   Mechanical aortic valve: Needs anticoagulation goal 2.5-3.5. Hemoglobin has been waxing/waning between 8.6-10.  - On IV heparin bridge.  - coumadin per pharmacy to continue if hemoglobin remains stable. INR 2.03 today.  - Will remain inpatient until therapeutic   HTN:  -  Hold amlodipine for now. BP's controlled with metoprolol XL.   Renal mass: S/p core biopsy and cryo ablation on 12/13.  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 No antibiotics have been administered to consider C.diff.  - New onset of increased WBC yesterday 11.5--> 12.9 - tending down   FEN/GI: Heart healthy diet  - KDUR 20mg  BID  today for potassium repletion (3.3) --> total of 8 doses - will check BMP tomorrow to reassess K - Stopped miralax PRN PPx:   -currently receiving IV heparin bridge - ambulate TID with assistance  Dispo:  -Pending continued stability of hemoglobin, transition to oral coumadin to therapeutic levels  -Discussed with patient safety concerns with discharge today, especially rushed discharged this morning.  Recommended staying until INR 2.5-3.5.  BOOTH, Jestin Burbach 03/02/2012, 9:56 AM

## 2012-03-02 NOTE — Progress Notes (Signed)
I have seen and examined this patient. I have discussed with Dr Elwyn Reach.  I agree with their findings and plans as documented in their progress note.  Awaiting patient's anticoagulation to reach 2.5 to 3.0 range before discharge.  Patient's post-void residual ~ 170 mL.  Foley catheter not replaced.

## 2012-03-02 NOTE — Progress Notes (Signed)
Subjective: Problem# 1: urinary retention. Hx of multi-month increasing bladder outlet symptoms, with urinary retention Monday. Catheter inserted, and Flomax started. Tolerating foley/flomax well.   Objective: Vital signs in last 24 hours: Temp:  [97.5 F (36.4 C)-99 F (37.2 C)] 99 F (37.2 C) (12/25 0733) Pulse Rate:  [60-98] 67  (12/25 0733) Resp:  [20] 20  (12/24 1300) BP: (108-139)/(74-94) 132/74 mmHg (12/25 0733) SpO2:  [94 %-98 %] 94 % (12/25 0733) Weight:  [95.255 kg (210 lb)] 95.255 kg (210 lb) (12/25 0733)A  Intake/Output from previous day: 12/24 0701 - 12/25 0700 In: 720 [P.O.:720] Out: 1050 [Urine:1050] Intake/Output this shift:    Past Medical History  Diagnosis Date  . Marfan's syndrome with aortic dilation   . Hypertension   . Aortic aneurysm 1994    From presumed Marfan's Syndrome.  Followed by Dr Jens Som.  Aneurysm involving the proximal descending thoracic aorta .    Marland Kitchen Pulmonary nodules     Found on chest CT 08/2007.  Monitoring with yearly CT.  3.93mm nodule in R iddle lobe.  Marland Kitchen Hyperlipidemia   . Atrial fibrillation     Followed by Dr Jens Som.  On coumadin.  . Atrial flutter     Followed by Dr Jens Som  . Osteoarthritis   . History of blood transfusion     Physical Exam:  General: awake alert. NAD. Lungs - Normal respiratory effort, chest expands symmetrically.  Abdomen - Soft, non-tender & non-distended.  Lab Results:  Basename 03/02/12 0445 03/01/12 0525 02/29/12 1753  WBC 11.4* 12.9* 14.2*  HGB 9.0* 8.7* 9.3*  HCT 26.5* 25.4* 27.2*   BMET  Basename 02/29/12 0530 02/28/12 0918  NA 134* 134*  K 3.3* 3.3*  CL 99 99  CO2 25 25  GLUCOSE 126* 137*  BUN 13 14  CREATININE 0.70 0.68  CALCIUM 8.9 8.7   No results found for this basename: LABURIN:1 in the last 72 hours Results for orders placed during the hospital encounter of 02/21/12  MRSA PCR SCREENING     Status: Normal   Collection Time   02/21/12  9:49 PM      Component Value  Range Status Comment   MRSA by PCR NEGATIVE  NEGATIVE Final   URINE CULTURE     Status: Normal   Collection Time   02/29/12 10:37 AM      Component Value Range Status Comment   Specimen Description URINE, CATHETERIZED   Final    Special Requests NONE   Final    Culture  Setup Time 02/29/2012 19:12   Final    Colony Count NO GROWTH   Final    Culture NO GROWTH   Final    Report Status 03/01/2012 FINAL   Final   GRAM STAIN     Status: Normal   Collection Time   02/29/12 10:37 AM      Component Value Range Status Comment   Specimen Description URINE, CATHETERIZED   Final    Special Requests NONE   Final    Gram Stain     Final    Value: CYTOSPIN     WBC PRESENT,BOTH PMN AND MONONUCLEAR     NEGATIVE FOR BACTERIA   Report Status 02/29/2012 FINAL   Final     Assessment/Plan: Pt has been on Flomax for 2 days. Needs voiding trial. Spoke to RN this AM. Will remove foley with voiding trial and replace foley if unable to void. He has no transportation, and lives by self. ? D/c  planning.   Ranya Fiddler I 03/02/2012, 8:38 AM

## 2012-03-02 NOTE — Progress Notes (Signed)
Pt foley catheter removed at 0945 am. Pt voided 75 ml at 1045.  Post void residual measured 171 ml. MD notified. Efraim Kaufmann

## 2012-03-02 NOTE — Progress Notes (Signed)
Patient ID: Juan Wilkerson, male   DOB: 1940/08/28, 71 y.o.   MRN: [id number]    SUBJECTIVE:   No complaints Ambulating Afib rate around 95 post ambulation Needs foley out with check of post void residual     . metoprolol succinate  25 mg Oral QHS  . metoprolol succinate  50 mg Oral Daily  . potassium chloride SA  20 mEq Oral BID  . sodium chloride  3 mL Intravenous Q12H  . Tamsulosin HCl  0.4 mg Oral Daily  . warfarin  3 mg Oral ONCE-1800  . Warfarin - Pharmacist Dosing Inpatient   Does not apply q1800  heparin gtt   Filed Vitals:   03/01/12 1300 03/01/12 2202 03/02/12 0733 03/02/12 0943  BP: 108/94 139/74 132/74 139/90  Pulse: 98 60 67 99  Temp: 97.5 F (36.4 C) 98.1 F (36.7 C) 99 F (37.2 C)   TempSrc: Oral Oral Oral   Resp: 20     Height:      Weight:   210 lb (95.255 kg)   SpO2: 98% 94% 94%     Intake/Output Summary (Last 24 hours) at 03/02/12 0944 Last data filed at 03/01/12 1900  Gross per 24 hour  Intake    480 ml  Output   1050 ml  Net   -570 ml    CBC:  Basename 03/02/12 0445 03/01/12 0525  WBC 11.4* 12.9*  NEUTROABS -- --  HGB 9.0* 8.7*  HCT 26.5* 25.4*  MCV 88.3 87.9  PLT 249 230    RADIOLOGY: Ct Abdomen Pelvis W Contrast  02/21/2012  *RADIOLOGY REPORT*  Clinical Data: Flank pain.  Recent left renal mass ablation.  CT ABDOMEN AND PELVIS WITH CONTRAST  Technique:  Multidetector CT imaging of the abdomen and pelvis was performed following the standard protocol during bolus administration of intravenous contrast.  Contrast: OMNIPAQUE IOHEXOL 300 MG/ML  SOLN  Comparison: CT images dated 12/13 and 11/20/2011  Findings: The patient has a large perirenal and retroperitoneal hemorrhage.  The left kidney is superiorly and anteriorly displaced by the hemorrhage.  There is no obstruction of the kidney.  The left upper pole renal mass that was ablated is visible.  There is also blood extending across the midline adjacent to the duodenum and into the  right pericolic gutter.  The hematoma extends into the left side of the pelvis and slightly compresses the left side of the bladder.  There is also a small amount of blood in the  left upper quadrant around the spleen.  Foley catheter in the bladder.  Delayed imaging demonstrates normal excretion of contrast from both kidneys.  Incidental note is made of multiple small cysts in the liver as well as small cysts in both kidneys.  Pancreas and spleen and adrenal glands are normal.  The bowel is normal except for being displaced by the large hematoma.  No acute osseous abnormality.  IMPRESSION:    Large retroperitoneal hemorrhage originating from the upper pole of the left kidney at the site of tumor ablation.  Secondary mass effect upon the kidney and adjacent bowel and bladder.  Critical Value/emergent results were called by telephone at the time of interpretation on 02/21/2012 at  7:21 p.m. to  Dr. Linwood Dibbles and to Dr. Malachy Moan,, who verbally acknowledged these results.   Original Report Authenticated By: Francene Boyers, M.D.    PHYSICAL EXAM  General: NAD. Walking halls with walker.  Neck: JVP 8 no thyromegaly or thyroid nodule.  Lungs: Clear to auscultation bilaterally with normal respiratory effort.  CV: Nondisplaced PMI. irregular S1/S2, no S3/S4, mechanical S2, no murmur. Trace  edema. No carotid bruit. Normal pedal pulses.  Abdomen: Soft, nontender, no hepatosplenomegaly, no distention.  Neurologic: Alert and oriented x 3.  Psych: Normal affect.  Extremities: No clubbing or cyanosis.   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  1. Afib rate stable with Toprol 50 am and 25 pm  2. Mechanical aortic valve: Normal exam no AR   INR 2.03  Ok to D/C heparin and discharge home if other medical issues stable  Will arrnage F/U in coumadin clinic next week and Dr Jens Som in 2-3 weeks.  Needs primary or someone to follow Hct and retroperitoneal issues  [name] 03/02/2012 9:44 AM

## 2012-03-03 LAB — BASIC METABOLIC PANEL
CO2: 25 mEq/L (ref 19–32)
Calcium: 9 mg/dL (ref 8.4–10.5)
Chloride: 100 mEq/L (ref 96–112)
Creatinine, Ser: 0.83 mg/dL (ref 0.50–1.35)
Glucose, Bld: 106 mg/dL — ABNORMAL HIGH (ref 70–99)

## 2012-03-03 LAB — CBC
Hemoglobin: 8.6 g/dL — ABNORMAL LOW (ref 13.0–17.0)
MCH: 29.3 pg (ref 26.0–34.0)
MCV: 88.4 fL (ref 78.0–100.0)
RBC: 2.94 MIL/uL — ABNORMAL LOW (ref 4.22–5.81)
WBC: 10.8 10*3/uL — ABNORMAL HIGH (ref 4.0–10.5)

## 2012-03-03 MED ORDER — METOPROLOL SUCCINATE ER 50 MG PO TB24: 50.0000 mg | ORAL_TABLET | Freq: Every day | ORAL | Status: DC

## 2012-03-03 NOTE — Progress Notes (Signed)
Patient ID: Juan Wilkerson, male   DOB: 27-Nov-1940, 72 y.o.   MRN: [id number] [facility] Medicine Teaching Service PGY-1 Progress Note   Overnight Events: Patient is awake reading a book this morning. He is looking forward to going home today. He is experiencing diarrhea. Objective: Temp:  [98.7 F (37.1 C)-99 F (37.2 C)] 98.8 F (37.1 C) (12/25 2100) Pulse Rate:  [65-99] 97  (12/25 2100) Cardiac Rhythm:  [-] Atrial flutter (12/25 2000) Resp:  [16-18] 18  (12/25 2100) BP: (132-151)/(74-90) 151/86 mmHg (12/25 2100) SpO2:  [94 %-95 %] 95 % (12/25 2100) Weight:  [210 lb (95.255 kg)] 210 lb (95.255 kg) (12/25 0733) Weight change:  BP 151/86  Pulse 97  Temp 98.8 F (37.1 C) (Oral)  Resp 18  Ht 6\' 4"  (1.93 m)  Wt 210 lb (95.255 kg)  BMI 25.56 kg/m2  SpO2 95%  Physical Exam: Gen: NAD. Afebrile.  CV: mild tachycardia, mechanical valve appreciated. Lungs: CTAB. No wheezing, rhonchi or rales.  Abd: Soft. Mild tenderness on left flank. Fading areas of ecchymosis in pubic area and left flank, extends to scrotum and shaft of penis are stable.  EXT: NT. No erythema, trace edema bilateral LE  Neuro: A/O x3, normal speech.   CBC    Component Value Date/Time   WBC 10.8* 03/03/2012 0535   RBC 2.94* 03/03/2012 0535   HGB 8.6* 03/03/2012 0535   HCT 26.0* 03/03/2012 0535   PLT 271 03/03/2012 0535   MCV 88.4 03/03/2012 0535   MCH 29.3 03/03/2012 0535   MCHC 33.1 03/03/2012 0535   RDW 14.9 03/03/2012 0535   LYMPHSABS 0.9 02/21/2012 1520   MONOABS 1.8* 02/21/2012 1520   EOSABS 0.1 02/21/2012 1520   BASOSABS 0.0 02/21/2012 1520    Lab 03/03/12 0535 03/02/12 0445 03/01/12 0525 02/29/12 0530 02/28/12 0504  INR 2.52* 2.03* 1.90* 1.38 1.24    Assessment and Plan:  71 yo male with history of Atrial Fibrillation, Marfan syndrome, HTN, mechanical aortic valve and 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    Retroperitoneal bleed: Was on  coumadin for a.fib but transitioned to lovenox for recent ablation procedure. Had procedure on 12/13 and now found to have retroperitoneal bleed on CT. CTA abdomen and pelvis 12/18: Resulted with slight improvement in retroperitoneal bleed and stable cryo site. Initial Hgb in ED was 13.1, down to 9.0 and pt received 1 unit PRBC transfusion on 12/17.  - HGB has been stable around 9   Urinary retention: Had multiple post void residuals >250 after Foley removal on 12/21. Foley replaced and started on Flomax.  - appreciate urology recommendations  - continue Flomax  -Foley dc'd yesterday, with a post-void residual of < 200cc  Atrial fibrillation: RVR on admission is resolved s/p diltiazem drip in ED. Cardiology consulted in ED.  - Diltiazem 240mg  discontinued in favor of home metoprolol.  - Metoprolol XR increased to 50mg  qam and 25 mg qhs with improved rate control. Patient continues to have tachycardia in spurts with increased doses of metoprolol that self-resolve.  -appreciate cards recommendations.   Mechanical aortic valve: Needs anticoagulation goal 2.5-3.5. Hemoglobin has been waxing/waning between 8.6-10.  - On IV heparin bridge discontinued - coumadin per pharmacy to continue if hemoglobin remains stable. INR 2.52 today.  - d/c today   HTN:  - Hold amlodipine for now. BP's controlled with metoprolol XL.   Renal mass: S/p core biopsy and cryo ablation on 12/13.  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 No antibiotics have been administered to consider C.diff.  - New onset of increased WBC yesterday 11.5--> 12.9-->10.8  - trending down   FEN/GI: Heart healthy diet  - Stopped miralax PRN 2 days ago  PPx:  -currently receiving IV heparin bridge  - ambulate TID with assistance  Dispo:  -Pending continued stability of hemoglobin, transition to oral coumadin to therapeutic levels  - Therapeutic  levels of coumadin have been achieved, probable discharge today.

## 2012-03-03 NOTE — Progress Notes (Signed)
Pt discharged to home per MD order.  Pt received and reviewed all discharge instructions and medication information including follow-up appointments and prescriptions.  Pt alert and oriented at discharge with no complaints of pain.  Pt escorted to private vehicle via wheelchair by guest services. Pt confirmed follow-up appointment tomorrow at 8:45 am with family practice. Efraim Kaufmann

## 2012-03-04 ENCOUNTER — Ambulatory Visit (INDEPENDENT_AMBULATORY_CARE_PROVIDER_SITE_OTHER): Payer: Medicare Other | Admitting: *Deleted

## 2012-03-04 ENCOUNTER — Ambulatory Visit: Payer: Medicare Other

## 2012-03-04 ENCOUNTER — Ambulatory Visit (INDEPENDENT_AMBULATORY_CARE_PROVIDER_SITE_OTHER): Payer: Medicare Other | Admitting: Family Medicine

## 2012-03-04 ENCOUNTER — Encounter: Payer: Self-pay | Admitting: Family Medicine

## 2012-03-04 VITALS — BP 155/87 | HR 106 | Temp 97.9°F | Ht 77.0 in | Wt 207.8 lb

## 2012-03-04 DIAGNOSIS — R58 Hemorrhage, not elsewhere classified: Secondary | ICD-10-CM

## 2012-03-04 DIAGNOSIS — Z954 Presence of other heart-valve replacement: Secondary | ICD-10-CM

## 2012-03-04 DIAGNOSIS — R339 Retention of urine, unspecified: Secondary | ICD-10-CM | POA: Insufficient documentation

## 2012-03-04 DIAGNOSIS — D649 Anemia, unspecified: Secondary | ICD-10-CM

## 2012-03-04 DIAGNOSIS — Z7901 Long term (current) use of anticoagulants: Secondary | ICD-10-CM

## 2012-03-04 LAB — CBC
HCT: 29.1 % — ABNORMAL LOW (ref 39.0–52.0)
MCH: 29.3 pg (ref 26.0–34.0)
MCV: 87.1 fL (ref 78.0–100.0)
Platelets: 358 10*3/uL (ref 150–400)
RBC: 3.34 MIL/uL — ABNORMAL LOW (ref 4.22–5.81)
RDW: 15.4 % (ref 11.5–15.5)
WBC: 9 10*3/uL (ref 4.0–10.5)

## 2012-03-04 MED ORDER — TAMSULOSIN HCL 0.4 MG PO CAPS: 0.4000 mg | ORAL_CAPSULE | Freq: Every day | ORAL | Status: DC

## 2012-03-04 NOTE — Progress Notes (Signed)
I discussed with Dr Kuneff.  I agree with their plans documented in their progress note for today.  

## 2012-03-04 NOTE — Progress Notes (Signed)
S: Pt comes in today for hospital follow up.  71 yo male with history of A. 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 Patient was on coumadin for a.fib but transitioned to lovenox for recent ablation procedure. Upon admission held all anticoagulate meds.  Admit was from 12/15-12/16 during which time he was initially anticoagulated with heparin gtt and transitioned to coumadin.  His hemoglobin remained stable.  He did require 1 unit of PRBC during his admission for blood loss.  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  Since d/c home, patient reports still having a little bit of pain in left flank, but has not worsened.  Also feels weak in general.  Got home late yesterday.    Did have urinary retention in the hospital and was sent home with out foley.  Feels like he is voiding well with a better stream.  Is not taking Flomax because he does not think it was prescribed.  He needs to make follow up with Urology for follow up post void residual.    ROS: Per HPI  History  Smoking status  . Never Smoker   Smokeless tobacco  . Never Used    O:  Filed Vitals:   03/04/12 0843  BP: 155/87  Pulse: 106  Temp: 97.9 F (36.6 C)    Gen: NAD, pleasant CV: irreg irreg, no murmur Pulm: CTA bilat, no wheezes or crackles Abd: soft, NT, no CVA tenderness, still with ecchymosis that appears to be resolving/slowly improving    A/P: 71 y.o. male p/w HFU s/p retroperitoneal bleed after IR guided cryoablation on 02/19/12 -See problem list -f/u in 2 weeks

## 2012-03-04 NOTE — Assessment & Plan Note (Signed)
Recheck CBC today to ensure hgb is still stable. INR slightly sub therapeutic at 2.4 today, but will not increase from current regimen of Coumadin 3mg  daily.  Repeat INR in 5 days. F/u with PCP in 2 weeks. Advised bruising will likely take weeks to improve. Red flags for sooner return discussed including worsening pain, weakness, dizziness.

## 2012-03-04 NOTE — Assessment & Plan Note (Signed)
Started on Flomax in house 2/2 elevated PVRs.  Foley was d/c'ed prior to d/c home and pt was doing well.  Reports he feels like he is emptying his bladder.  Flomax was not Rx'ed at d/c so I sent it in today.  He is aware he needs to f/u with Urology so they can check another PVR.

## 2012-03-04 NOTE — Patient Instructions (Addendum)
It was good to see you today not in the hospital!  We are checking your INR and red blood cell count again today.  We will figure out your follow up based on your INR but I want to see you in at least 2 weeks to see how your weakness is doing.  Make sure you call to follow up with Urology to make sure you are emptying your bladder.  I sent in your Flomax to [facility]-- start taking that again today.  Call or come back if you feel like your weakness is worsening, you're getting dizzy, or your back/side start hurting more.  The bruising will take a number of weeks to go away.

## 2012-03-05 NOTE — Discharge Summary (Signed)
I have reviewed this discharge summary and agree.    

## 2012-03-07 ENCOUNTER — Ambulatory Visit: Payer: Medicare Other

## 2012-03-08 ENCOUNTER — Other Ambulatory Visit: Payer: Self-pay | Admitting: Interventional Radiology

## 2012-03-08 ENCOUNTER — Telehealth: Payer: Self-pay | Admitting: *Deleted

## 2012-03-08 DIAGNOSIS — D3000 Benign neoplasm of unspecified kidney: Secondary | ICD-10-CM

## 2012-03-08 DIAGNOSIS — N2889 Other specified disorders of kidney and ureter: Secondary | ICD-10-CM

## 2012-03-08 NOTE — Telephone Encounter (Signed)
Telephoned pt and discussed scheduling a coumadin appt next week here per Dr Fabio Bering request. Pt states he follows at [facility] for his coumadin therapy and doesn't want to change providers at this time. Thus, informed him that he should have INR checked next week per cardiology suggestion.

## 2012-03-10 ENCOUNTER — Other Ambulatory Visit: Payer: Self-pay | Admitting: Radiology

## 2012-03-10 DIAGNOSIS — N2889 Other specified disorders of kidney and ureter: Secondary | ICD-10-CM

## 2012-03-17 ENCOUNTER — Other Ambulatory Visit: Payer: Self-pay | Admitting: Family Medicine

## 2012-03-18 ENCOUNTER — Ambulatory Visit (INDEPENDENT_AMBULATORY_CARE_PROVIDER_SITE_OTHER): Payer: Medicare Other | Admitting: Family Medicine

## 2012-03-18 ENCOUNTER — Encounter: Payer: Self-pay | Admitting: Family Medicine

## 2012-03-18 ENCOUNTER — Ambulatory Visit (INDEPENDENT_AMBULATORY_CARE_PROVIDER_SITE_OTHER): Payer: Medicare Other | Admitting: *Deleted

## 2012-03-18 VITALS — BP 145/95 | HR 121 | Ht 77.0 in | Wt 193.0 lb

## 2012-03-18 DIAGNOSIS — Z7901 Long term (current) use of anticoagulants: Secondary | ICD-10-CM

## 2012-03-18 DIAGNOSIS — R58 Hemorrhage, not elsewhere classified: Secondary | ICD-10-CM

## 2012-03-18 DIAGNOSIS — Z954 Presence of other heart-valve replacement: Secondary | ICD-10-CM

## 2012-03-18 DIAGNOSIS — I4891 Unspecified atrial fibrillation: Secondary | ICD-10-CM

## 2012-03-18 DIAGNOSIS — K5900 Constipation, unspecified: Secondary | ICD-10-CM

## 2012-03-18 DIAGNOSIS — R339 Retention of urine, unspecified: Secondary | ICD-10-CM

## 2012-03-18 LAB — CREATININE WITH EST GFR
Creat: 0.85 mg/dL (ref 0.50–1.35)
GFR, Est African American: >89 mL/min

## 2012-03-18 LAB — POCT INR: INR: 2.4

## 2012-03-18 MED ORDER — POLYETHYLENE GLYCOL 3350 17 G PO PACK: 17.0000 g | PACK | Freq: Every day | ORAL | Status: DC

## 2012-03-18 NOTE — Assessment & Plan Note (Signed)
HR elevated today, in a fib.  Currently on Toprol 25 qAM + 50 qPM.  Will defer to Cards for management of this.  Pt is wondering if he can have generic form due to cost of medicine, which I don't foresee as an issue.

## 2012-03-18 NOTE — Progress Notes (Signed)
S: Pt comes in today for follow up.  Patient recently admitted s/p renal mass biopsy for retroperitoneal bleed from 12/15-12/26/13.  Coumadin was restarted for his a fib while he was in house.  He feels like his weakness is slowly getting better and isn't getting dizzy anymore.  His bruising is slowly improving but still present.  Is able to do some chores now.  No CP or SOB.  Is still having some RLQ irritation that is similar to when he was in the hospital.  Last BM was yesterday but it was hard, small pebbles.    He also had urinary retention while in house and was started on flomax.  He was d/c'ed home without a foley.  He has followed up with urology as recommended.  They did a PVR and did not feel like he needed to have a foley replaced.  He feels like he is urinating well.  Emptying bladder fully, no hesitancy, less urgency, increased amount with voids.    A fib-- is currently taking Toprol XL 25mg  qAM and 50mg  qPM.  No palpitations, no feelings of heart racing, no dizziness.     ROS: Per HPI  History  Smoking status  . Never Smoker   Smokeless tobacco  . Never Used    O:  Filed Vitals:   03/18/12 0842  BP: 145/95  Pulse: 121    Gen: NAD, pleasant, talkative  CV: tachycardic, irregularly irregular, no murmur noted  Pulm: CTA bilat, no wheezes or crackles Abd: soft, NT, no CVA tenderness, no bruising noted   Ext: Warm, no chronic skin changes, no edema   A/P: 72 y.o. male p/w s/p retroperitoneal bleed, a fib, constipation, BPH -See problem list -f/u in 2-3 weeks for INR, 1-2 months with me

## 2012-03-18 NOTE — Patient Instructions (Addendum)
It was good to see you today.  For your stomach pain and constipation, we will try Miralax which is a very gentle laxative.  Takes 1 packet or capful in 8oz of water or juice every day.  If in a few days you are still not having soft bowel movements every day, increase to 2 time per day, then 3 times, etc, increasing until you have 1 soft, formed BM daily.  Once you have these for a few days, you can try to decrease back down to 1 time per day if you needed to take it multiple times per day.  I would take the miralax for about 1 month.  For your coumadin, increase to 1 1/2 tablets on SATURDAYS.  Otherwise, keep taking 1 tablet (3mg ) per day.  I will let your cardiologist make adjustments to your metoprolol later this month.    I'm glad you are slowly feeling better!  Come back to have your INR rechecked in 2-3 weeks, come back to see me within 2 months.

## 2012-03-18 NOTE — Assessment & Plan Note (Signed)
Has followed up with urology- no further intervention needed. Continue flomax, appears to be working well.

## 2012-03-18 NOTE — Assessment & Plan Note (Signed)
Will try Miralax, likely the cause of his LLQ discomfort. No blood in his stools.

## 2012-03-18 NOTE — Assessment & Plan Note (Signed)
External bruising has resolved.  Symptoms improved.  No need to recheck Hgb at this time.

## 2012-03-23 ENCOUNTER — Ambulatory Visit
Admission: RE | Admit: 2012-03-23 | Discharge: 2012-03-23 | Disposition: A | Discharge status: Home / Self Care | Payer: Medicare Other | Source: Ambulatory Visit | Attending: Interventional Radiology | Admitting: Interventional Radiology

## 2012-03-23 ENCOUNTER — Ambulatory Visit (HOSPITAL_COMMUNITY)
Admission: RE | Admit: 2012-03-23 | Discharge: 2012-03-23 | Disposition: A | Discharge status: Home / Self Care | Payer: Medicare Other | Source: Ambulatory Visit | Attending: Interventional Radiology | Admitting: Interventional Radiology

## 2012-03-23 DIAGNOSIS — Z09 Encounter for follow-up examination after completed treatment for conditions other than malignant neoplasm: Secondary | ICD-10-CM | POA: Insufficient documentation

## 2012-03-23 DIAGNOSIS — N281 Cyst of kidney, acquired: Secondary | ICD-10-CM | POA: Insufficient documentation

## 2012-03-23 DIAGNOSIS — N289 Disorder of kidney and ureter, unspecified: Secondary | ICD-10-CM | POA: Insufficient documentation

## 2012-03-23 DIAGNOSIS — S36899A Unspecified injury of other intra-abdominal organs, initial encounter: Secondary | ICD-10-CM | POA: Insufficient documentation

## 2012-03-23 DIAGNOSIS — K7689 Other specified diseases of liver: Secondary | ICD-10-CM | POA: Insufficient documentation

## 2012-03-23 DIAGNOSIS — N2889 Other specified disorders of kidney and ureter: Secondary | ICD-10-CM

## 2012-03-23 DIAGNOSIS — D3000 Benign neoplasm of unspecified kidney: Secondary | ICD-10-CM

## 2012-03-23 DIAGNOSIS — J90 Pleural effusion, not elsewhere classified: Secondary | ICD-10-CM | POA: Insufficient documentation

## 2012-03-23 DIAGNOSIS — K8689 Other specified diseases of pancreas: Secondary | ICD-10-CM | POA: Insufficient documentation

## 2012-03-23 DIAGNOSIS — X58XXXA Exposure to other specified factors, initial encounter: Secondary | ICD-10-CM | POA: Insufficient documentation

## 2012-03-23 DIAGNOSIS — I517 Cardiomegaly: Secondary | ICD-10-CM | POA: Insufficient documentation

## 2012-03-23 MED ORDER — IOHEXOL 300 MG/ML  SOLN
100.0000 mL | Freq: Once | INTRAMUSCULAR | Status: AC | PRN
  Administered 2012-03-23: 100 mL via INTRAVENOUS

## 2012-03-23 NOTE — Progress Notes (Signed)
S/P:  Cryoablation of left renal mass on 02/19/2012.  Denies hematuria.    Recently seen by Dr Fara Boros (03/18/2012)  & started on Flomax & Miralax.  States that he has been experiencing LLQ pain.  Has been routinely taking Tylenol ES tabs 2 bid (for some time prior to procedure).  Afebrile  Appetite:  Fair.  Weight 193 lbs.  Pt states that he has lost about 10-15 lbs.    Fatigues easily.  Gradually increasing activities.    Sleeping:  Well.

## 2012-03-24 NOTE — Progress Notes (Signed)
Patient ID: Juan Wilkerson, male   DOB: 08/11/1940, 72 y.o.   MRN: [id number]  ESTABLISHED PATIENT OFFICE VISIT  Chief Complaint: Status post percutaneous cryoablation of a left renal oncocytoma on 02/19/2012.  History: Juan Wilkerson is status post cryoablation of a left renal neoplasm with biopsy demonstrating evidence of oncocytoma with focal psammoma bodies. The procedure was complicated by a left retroperitoneal hemorrhage which developed 2 days after the procedure once the patient resume Coumadin on top of a Lovenox bridge for chronic anticoagulation necessary due to an indwelling mechanical aortic valve. The patient was hospitalized after the bleed and did not require transfusion. Symptoms of left-sided abdominal pain radiating into the pelvis have improved to the point of only currently mild pain which does respond to use of Tylenol. The patient is now back on his regular Coumadin dosage.  Review of Systems: No fever or chills. No hematuria or dysuria.  Exam: Vital signs: Blood pressure 158/88, pulse 82, respirations 16, temperature 98.2, oxygen saturation 98% on room air. Abdomen: Soft and nontender. No focal left flank tenderness.  Labs: BUN 16, creatinine 0.85 and estimated GFR 87 ml/minute on 03/18/2012. Most recent INR is 2.4 on 03/18/2012. Hemoglobin 9.8 on 03/04/2012.  Imaging: Follow-up CT was performed today. Ablation defect on the left encompasses the treated neoplasm with no evidence of abnormal enhancement. No hydronephrosis is identified. An evolving left retroperitoneal/posterior perirenal hematoma shows more well- circumscribed appearance since prior imaging and is decreasing in size.  Assessment and Plan: The patient shows clinical improvement after complication of a left retroperitoneal hemorrhage following cryoablation of a left renal oncocytoma. Hemorrhage is decreasing in size by imaging. Due to the persistent hematoma which still measures over 10 cm in  greatest dimensions, I have recommended follow-up CT in 2-3 months to continue to evaluate the hemorrhage and ablation site. The patient is agreeable.

## 2012-04-01 ENCOUNTER — Ambulatory Visit (INDEPENDENT_AMBULATORY_CARE_PROVIDER_SITE_OTHER): Payer: Medicare Other | Admitting: *Deleted

## 2012-04-01 ENCOUNTER — Encounter: Payer: Medicare Other | Admitting: Physician Assistant

## 2012-04-01 DIAGNOSIS — Z7901 Long term (current) use of anticoagulants: Secondary | ICD-10-CM

## 2012-04-01 DIAGNOSIS — Z954 Presence of other heart-valve replacement: Secondary | ICD-10-CM

## 2012-04-04 ENCOUNTER — Encounter: Payer: Self-pay | Admitting: Physician Assistant

## 2012-04-04 ENCOUNTER — Ambulatory Visit (INDEPENDENT_AMBULATORY_CARE_PROVIDER_SITE_OTHER): Payer: Medicare Other | Admitting: Physician Assistant

## 2012-04-04 VITALS — BP 154/92 | HR 82 | Ht 76.0 in | Wt 195.0 lb

## 2012-04-04 DIAGNOSIS — I4891 Unspecified atrial fibrillation: Secondary | ICD-10-CM

## 2012-04-04 DIAGNOSIS — I10 Essential (primary) hypertension: Secondary | ICD-10-CM

## 2012-04-04 DIAGNOSIS — I359 Nonrheumatic aortic valve disorder, unspecified: Secondary | ICD-10-CM

## 2012-04-04 DIAGNOSIS — R58 Hemorrhage, not elsewhere classified: Secondary | ICD-10-CM

## 2012-04-04 DIAGNOSIS — Z954 Presence of other heart-valve replacement: Secondary | ICD-10-CM

## 2012-04-04 MED ORDER — METOPROLOL SUCCINATE ER 50 MG PO TB24: 50.0000 mg | ORAL_TABLET | Freq: Two times a day (BID) | ORAL | Status: DC

## 2012-04-04 NOTE — Progress Notes (Signed)
[address], [address] [place], [place]  [address] Phone: [phone], Fax:  [phone]  Date:  04/04/2012   ID:  Juan Wilkerson, DOB 08-13-1940, MRN [id number]  PCP:  Demetria Pore, MD  Primary Cardiologist:  Dr. Olga Millers     History of Present Illness: Juan Wilkerson is a 72 y.o. male who returns for followup after recent hospital admission with retroperitoneal bleed after cryoablation for renal neoplasm in the setting of chronic Coumadin therapy for atrial fibrillation/flutter and St Jude mechanical aortic valve replacement.  He has a history of Marfan syndrome, status post St. Jude AVR and aortic reconstruction as well as resection of an aortic aneurysm in 1995, permanent atrial fibrillation/flutter, chronic Coumadin therapy.  Echo 9/13: Mild LVH, EF 50-55%, prosthetic AVR not well seen but with normal gradients, mild LAE. 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 Last seen by Dr. Jens Som 9/13. Patient had cryoablation for his renal mass by Dr., Lorin Picket a. He developed a retroperitoneal hematoma. He had been bridged with Lovenox. He had some rapid rates with his atrial fibrillation/flutter. Cardiology was asked to see the patient regarding his anticoagulation and arrhythmia. He did receive 1 unit of PRBCs. Bleeding stabilized and he was eventually placed back on heparin bridge to Coumadin. Of note, amlodipine was held.  Since discharge, he is doing better. He still has some left flank pain. This is improving. Followup CBC with his PCP demonstrated improving hemoglobin. He denies chest pain, significant shortness of breath, syncope, orthopnea, PND or significant pedal edema.  Labs (12/13):   K 3.7, creatinine 0.83, Hgb 8.7 => 9.8  Wt Readings from Last 3 Encounters:  03/23/12  193 lb (87.544 kg)  03/18/12 193 lb (87.544 kg)  03/04/12 207 lb 12.8 oz (94.257 kg)     Past Medical History  Diagnosis Date  . Marfan's syndrome with aortic dilation   . Hypertension   . Aortic aneurysm 1994    From presumed Marfan's Syndrome.  Followed by Dr Jens Som.  Aneurysm involving the proximal descending thoracic aorta .    Marland Kitchen Pulmonary nodules     Found on chest CT 08/2007.  Monitoring with yearly CT.  3.41mm nodule in R iddle lobe.  Marland Kitchen Hyperlipidemia   . Atrial fibrillation     Followed by Dr Jens Som.  On coumadin.  . Atrial flutter     Followed by Dr Jens Som  . Osteoarthritis   . History of blood transfusion     Current Outpatient Prescriptions  Medication Sig Dispense Refill  . acetaminophen (TYLENOL) 500 MG tablet Take 1,000 mg by mouth 2 (two) times daily as needed. As needed for pain      . Glucosamine-Chondroit-Vit C-Mn (GLUCOSAMINE-CHONDROITIN) TABS Take 2 tablets by mouth every morning.       . hydrocortisone 1 % cream Apply topically. Apply as needed for itching.      . metoprolol succinate (TOPROL-XL) 25 MG 24 hr tablet Take 25 mg by mouth every morning.      . metoprolol succinate (TOPROL-XL) 50 MG 24 hr tablet Take 1 tablet (50 mg total) by mouth daily. Take with or immediately following a meal.  30 tablet  0  . Multiple Vitamin (MULTIVITAMIN) capsule Take 1 capsule by mouth every morning.       . polyethylene glycol (MIRALAX / GLYCOLAX) packet  Take 17 g by mouth daily. Increase to BID if needed.  100 each  5  . pravastatin (PRAVACHOL) 40 MG tablet Take 40 mg by mouth every evening.      . Tamsulosin HCl (FLOMAX) 0.4 MG CAPS Take 1 capsule (0.4 mg total) by mouth daily.  30 capsule  3  . warfarin (COUMADIN) 3 MG tablet Take 3 mg by mouth every morning.        Allergies:   No Known Allergies  Social History:  The patient  reports that he has never smoked. He has never used smokeless tobacco. He reports that he does not drink alcohol or use illicit drugs.    ROS:  Please see the history of present illness.      All other systems reviewed and negative.   PHYSICAL EXAM: VS:  BP 154/92  Pulse 82  Ht 6\' 4"  (1.93 m)  Wt 195 lb (88.451 kg)  BMI 23.74 kg/m2 Well nourished, well developed, in no acute distress HEENT: normal Neck: no JVD Cardiac:  normal S1, mechanical S2; irregularly irregular rhythm Lungs:  clear to auscultation bilaterally, no wheezing, rhonchi or rales Abd: soft, nontender, no hepatomegaly Ext: no edema Skin: warm and dry Neuro:  CNs 2-12 intact, no focal abnormalities noted  EKG:  Atrial flutter, HR 82, LAD, no change from prior tracing     ASSESSMENT AND PLAN:  1. Atrial Fibrillation/Flutter:  Rate controlled. Coumadin is managed by our Coumadin clinic. 2. Hypertension:  Blood pressure above target. Increase Toprol to 50 mg twice daily. 3. Status Post Mechanical Aortic Valve Replacement:  Coumadin managed by Coumadin clinic. 4. Retroperitoneal Hematoma:  Resolving. 5. Disposition: Followup with Dr. Jens Som in 3 months.  Signed, Tereso Newcomer, PA-C  10:45 AM 04/04/2012

## 2012-04-04 NOTE — Patient Instructions (Addendum)
PLEASE MAKE AN APPT TO SEE DR. CRENSHAW IN 3 MONTHS  CHANGE METOPROLOLTO 50 MG TWICE DAILY

## 2012-04-15 ENCOUNTER — Ambulatory Visit (INDEPENDENT_AMBULATORY_CARE_PROVIDER_SITE_OTHER): Payer: Medicare Other | Admitting: *Deleted

## 2012-04-15 DIAGNOSIS — Z7901 Long term (current) use of anticoagulants: Secondary | ICD-10-CM

## 2012-04-15 DIAGNOSIS — Z954 Presence of other heart-valve replacement: Secondary | ICD-10-CM

## 2012-04-20 ENCOUNTER — Other Ambulatory Visit (HOSPITAL_COMMUNITY): Payer: Self-pay | Admitting: Interventional Radiology

## 2012-04-20 DIAGNOSIS — N2889 Other specified disorders of kidney and ureter: Secondary | ICD-10-CM

## 2012-04-25 ENCOUNTER — Other Ambulatory Visit: Payer: Self-pay | Admitting: Emergency Medicine

## 2012-04-25 DIAGNOSIS — D3002 Benign neoplasm of left kidney: Secondary | ICD-10-CM

## 2012-05-06 ENCOUNTER — Ambulatory Visit (INDEPENDENT_AMBULATORY_CARE_PROVIDER_SITE_OTHER): Payer: Medicare Other | Admitting: *Deleted

## 2012-05-06 DIAGNOSIS — Z954 Presence of other heart-valve replacement: Secondary | ICD-10-CM

## 2012-05-06 DIAGNOSIS — Z7901 Long term (current) use of anticoagulants: Secondary | ICD-10-CM

## 2012-05-06 LAB — POCT INR: INR: 2.3

## 2012-05-20 ENCOUNTER — Ambulatory Visit: Payer: Medicare Other

## 2012-05-24 ENCOUNTER — Ambulatory Visit: Payer: Medicare Other

## 2012-05-26 ENCOUNTER — Ambulatory Visit (INDEPENDENT_AMBULATORY_CARE_PROVIDER_SITE_OTHER): Payer: Medicare Other | Admitting: *Deleted

## 2012-05-26 DIAGNOSIS — Z954 Presence of other heart-valve replacement: Secondary | ICD-10-CM

## 2012-05-26 DIAGNOSIS — Z7901 Long term (current) use of anticoagulants: Secondary | ICD-10-CM

## 2012-05-27 LAB — BUN: BUN: 22 mg/dL (ref 6–23)

## 2012-05-31 ENCOUNTER — Ambulatory Visit (HOSPITAL_COMMUNITY)
Admission: RE | Admit: 2012-05-31 | Discharge: 2012-05-31 | Disposition: A | Discharge status: Home / Self Care | Payer: Medicare Other | Source: Ambulatory Visit | Attending: Interventional Radiology | Admitting: Interventional Radiology

## 2012-05-31 ENCOUNTER — Inpatient Hospital Stay
Admission: RE | Admit: 2012-05-31 | Discharge: 2012-05-31 | Disposition: A | Discharge status: Home / Self Care | Payer: Medicare Other | Source: Ambulatory Visit | Attending: Interventional Radiology | Admitting: Interventional Radiology

## 2012-06-07 ENCOUNTER — Ambulatory Visit (HOSPITAL_COMMUNITY)
Admission: RE | Admit: 2012-06-07 | Discharge: 2012-06-07 | Disposition: A | Discharge status: Home / Self Care | Payer: Medicare Other | Source: Ambulatory Visit | Attending: Urology | Admitting: Urology

## 2012-06-07 ENCOUNTER — Other Ambulatory Visit (HOSPITAL_COMMUNITY): Payer: Self-pay | Admitting: Urology

## 2012-06-07 DIAGNOSIS — I10 Essential (primary) hypertension: Secondary | ICD-10-CM | POA: Insufficient documentation

## 2012-06-07 DIAGNOSIS — D4959 Neoplasm of unspecified behavior of other genitourinary organ: Secondary | ICD-10-CM | POA: Insufficient documentation

## 2012-06-08 ENCOUNTER — Telehealth: Payer: Self-pay | Admitting: Emergency Medicine

## 2012-06-08 NOTE — Telephone Encounter (Signed)
Called pt to make him aware that Dr Fredia Sorrow tried doing a peer-2-peer w/ his insurance to get an approval for the CT scan. Per Dr Lanier Prude, he will have to write a formal appeal letter and send it to the insurance company along with any pertinent medical records.  I did as the pt. His status of how he was feeling.  Pt. Denies diarrhea, constipation, urinary issues, fever, chills, sweats.   Appetite good.  The severe pain he was experiencing has diminished to a nagging pain on the left side, only becomes noticeable when he is  More active.  Some sharp pains, and he does not experience it each day. (he wasn't too concerned with it.)  He takes ES Tylenol 4x daily anyway for his knee and that may be helping with his pain.  Pt has an appt. To see Dr Laverle Patter on 06-15-12.  We will contact his as soon as we hear back from his insurance.   Jari Sportsman, EMT 06/08/2012 4:23 PM

## 2012-06-16 ENCOUNTER — Ambulatory Visit (HOSPITAL_COMMUNITY): Payer: Medicare Other

## 2012-06-16 ENCOUNTER — Other Ambulatory Visit: Payer: Medicare Other

## 2012-06-23 ENCOUNTER — Ambulatory Visit (INDEPENDENT_AMBULATORY_CARE_PROVIDER_SITE_OTHER): Payer: Medicare Other | Admitting: *Deleted

## 2012-06-23 DIAGNOSIS — Z954 Presence of other heart-valve replacement: Secondary | ICD-10-CM

## 2012-06-23 DIAGNOSIS — Z7901 Long term (current) use of anticoagulants: Secondary | ICD-10-CM

## 2012-06-23 LAB — POCT INR: INR: 4.1

## 2012-06-30 ENCOUNTER — Other Ambulatory Visit: Payer: Self-pay | Admitting: Family Medicine

## 2012-07-01 ENCOUNTER — Encounter: Payer: Self-pay | Admitting: Emergency Medicine

## 2012-07-03 ENCOUNTER — Other Ambulatory Visit: Payer: Self-pay | Admitting: Family Medicine

## 2012-07-03 ENCOUNTER — Other Ambulatory Visit: Payer: Self-pay | Admitting: Cardiology

## 2012-07-06 ENCOUNTER — Ambulatory Visit (INDEPENDENT_AMBULATORY_CARE_PROVIDER_SITE_OTHER): Payer: Medicare Other | Admitting: Cardiology

## 2012-07-06 ENCOUNTER — Encounter: Payer: Self-pay | Admitting: Cardiology

## 2012-07-06 VITALS — BP 170/100 | HR 61 | Wt 192.0 lb

## 2012-07-06 DIAGNOSIS — I4891 Unspecified atrial fibrillation: Secondary | ICD-10-CM

## 2012-07-06 DIAGNOSIS — I712 Thoracic aortic aneurysm, without rupture: Secondary | ICD-10-CM

## 2012-07-06 MED ORDER — AMLODIPINE BESYLATE 5 MG PO TABS: 5.0000 mg | ORAL_TABLET | Freq: Every day | ORAL | Status: DC

## 2012-07-06 NOTE — Progress Notes (Signed)
HPI: Pleasant male for fu of atrial fibrillation/flutter and St Jude mechanical aortic valve replacement. History of Marfan syndrome, status post St. Jude AVR and aortic reconstruction as well as resection of an aortic aneurysm in 1995, permanent atrial fibrillation/flutter, chronic Coumadin therapy. Echo 9/13: Mild LVH, EF 50-55%, prosthetic AVR not well seen but with normal gradients, mild LAE. 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 Patient had cryoablation for his renal mass by Dr. Lorin Picket. He developed a retroperitoneal hematoma. He was bridged with Lovenox. He had some rapid rates with his atrial fibrillation/flutter. Cardiology was asked to see the patient regarding his anticoagulation and arrhythmia. He did receive 1 unit of PRBCs. Bleeding stabilized and he was eventually placed back on Coumadin. Patient last seen in Jan 2014. Since then, the patient denies any dyspnea on exertion, orthopnea, PND, pedal edema, palpitations, syncope or chest pain.    Current Outpatient Prescriptions  Medication Sig Dispense Refill  . acetaminophen (TYLENOL) 500 MG tablet Take 1,000 mg by mouth 2 (two) times daily as needed. As needed for pain      . Glucosamine-Chondroit-Vit C-Mn (GLUCOSAMINE-CHONDROITIN) TABS Take 2 tablets by mouth every morning.       . hydrocortisone 1 % cream Apply topically. Apply as needed for itching.      . loratadine (CLARITIN) 10 MG tablet Take 10 mg by mouth daily.      . metoprolol succinate (TOPROL-XL) 50 MG 24 hr tablet Take 1 tablet (50 mg total) by mouth 2 (two) times daily. Take with or immediately following a meal.  60 tablet  11  . Multiple Vitamin (MULTIVITAMIN) capsule Take 1 capsule by mouth every morning.       . polyethylene glycol (MIRALAX / GLYCOLAX) packet Take 17 g by  mouth daily. Increase to BID if needed.  100 each  5  . pravastatin (PRAVACHOL) 40 MG tablet TAKE 1 TABLET BY MOUTH EVERY DAY  30 tablet  5  . tamsulosin (FLOMAX) 0.4 MG CAPS TAKE ONE CAPSULE BY MOUTH EVERY DAY  30 capsule  3  . warfarin (COUMADIN) 3 MG tablet TAKE 1 TABLET BY MOUTH DAILY. TAKE AS DIRECTED.  40 tablet  3   No current facility-administered medications for this visit.     Past Medical History  Diagnosis Date  . Marfan's syndrome with aortic dilation   . Hypertension   . Aortic aneurysm 1994    From presumed Marfan's Syndrome.  Followed by Dr Jens Som.  Aneurysm involving the proximal descending thoracic aorta .    Marland Kitchen Pulmonary nodules     Found on chest CT 08/2007.  Monitoring with yearly CT.  3.42mm nodule in R iddle lobe.  Marland Kitchen Hyperlipidemia   . Atrial fibrillation     Followed by Dr Jens Som.  On coumadin.  . Atrial flutter     Followed by Dr Jens Som  . Osteoarthritis   . History of blood transfusion     Past Surgical History  Procedure Laterality Date  . Virtual colonoscopy  11/01/2005    Pt on chronic coumadin for Aortic valve replacement and Atrial fibrilliation therefore at high risk if stopped.  Need to repeat every 5 yrs.   . Aortic valve replacement  03/1993    St Jude Valve  . Hip fracture surgery  03/1992    s/p R hip Fx  .  Vascular surgery    . Cardiac valve replacement    . Kidney surgery    . Coronary artery bypass graft      History   Social History  . Marital Status: Divorced    Spouse Name: N/A    Number of Children: N/A  . Years of Education: College   Occupational History  .     Social History Main Topics  . Smoking status: Never Smoker   . Smokeless tobacco: Never Used  . Alcohol Use: No  . Drug Use: No  . Sexually Active: No   Other Topics Concern  . Not on file   Social History Narrative   Divorced, lives alone. Has a daughter (pt does not have contact with her, has not seen her since she was 4) .    Studied music in  college.   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   No smoking. No alcohol use. No recreational drugs.             ROS: no fevers or chills, productive cough, hemoptysis, dysphasia, odynophagia, melena, hematochezia, dysuria, hematuria, rash, seizure activity, orthopnea, PND, pedal edema, claudication. Remaining systems are negative.  Physical Exam: Well-developed well-nourished in no acute distress.  Skin is warm and dry.  HEENT is normal.  Neck is supple.  Chest is clear to auscultation with normal expansion.  Cardiovascular exam is irregular, crisp mechanical valve sounds Abdominal exam nontender or distended. No masses palpated. Extremities show no edema. neuro grossly intact  ECG atrial flutter at a rate of 61. Incomplete right bundle branch block.

## 2012-07-06 NOTE — Assessment & Plan Note (Signed)
Continue SBE prophylaxis. 

## 2012-07-06 NOTE — Assessment & Plan Note (Signed)
Continue Toprol for rate control. Continue Coumadin.

## 2012-07-06 NOTE — Assessment & Plan Note (Signed)
Plan repeat CTA in 6 months when he returns to evaluate thoracic aneurysm and dilated iliacs.

## 2012-07-06 NOTE — Patient Instructions (Addendum)
Your physician wants you to follow-up in: 6 MONTHS WITH DR CRENSHAW You will receive a reminder letter in the mail two months in advance. If you don't receive a letter, please call our office to schedule the follow-up appointment.   START AMLODIPINE 5 MG ONCE DAILY 

## 2012-07-06 NOTE — Assessment & Plan Note (Signed)
Continue statin. 

## 2012-07-06 NOTE — Assessment & Plan Note (Signed)
Blood pressure is elevated. Add Norvasc 5 mg daily. Follow and adjust regimen as indicated.

## 2012-07-07 ENCOUNTER — Ambulatory Visit (INDEPENDENT_AMBULATORY_CARE_PROVIDER_SITE_OTHER): Payer: Medicare Other | Admitting: *Deleted

## 2012-07-07 DIAGNOSIS — Z954 Presence of other heart-valve replacement: Secondary | ICD-10-CM

## 2012-07-07 DIAGNOSIS — Z7901 Long term (current) use of anticoagulants: Secondary | ICD-10-CM

## 2012-07-07 LAB — POCT INR: INR: 4.5

## 2012-07-15 ENCOUNTER — Ambulatory Visit (INDEPENDENT_AMBULATORY_CARE_PROVIDER_SITE_OTHER): Payer: Medicare Other | Admitting: *Deleted

## 2012-07-15 DIAGNOSIS — Z954 Presence of other heart-valve replacement: Secondary | ICD-10-CM

## 2012-07-15 DIAGNOSIS — Z7901 Long term (current) use of anticoagulants: Secondary | ICD-10-CM

## 2012-07-22 ENCOUNTER — Ambulatory Visit (INDEPENDENT_AMBULATORY_CARE_PROVIDER_SITE_OTHER): Payer: Medicare Other | Admitting: *Deleted

## 2012-07-22 DIAGNOSIS — Z954 Presence of other heart-valve replacement: Secondary | ICD-10-CM

## 2012-07-22 DIAGNOSIS — Z7901 Long term (current) use of anticoagulants: Secondary | ICD-10-CM

## 2012-08-02 ENCOUNTER — Other Ambulatory Visit: Payer: Medicare Other

## 2012-08-04 ENCOUNTER — Ambulatory Visit
Admission: RE | Admit: 2012-08-04 | Discharge: 2012-08-04 | Disposition: A | Discharge status: Home / Self Care | Payer: Medicare Other | Source: Ambulatory Visit | Attending: Interventional Radiology | Admitting: Interventional Radiology

## 2012-08-04 DIAGNOSIS — N2889 Other specified disorders of kidney and ureter: Secondary | ICD-10-CM

## 2012-08-04 NOTE — Progress Notes (Signed)
Denies hematuria or any other problems with urination.  Occasional very minimal discomfort of Left flank.  Resolved without Rx.  Overall doing well.  Juan Wilkerson, [place] 08/04/2012 10:38 AM

## 2012-08-04 NOTE — Progress Notes (Signed)
Patient ID: Juan Wilkerson, male   DOB: Jul 11, 1940, 72 y.o.   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  History: Mr. Juan Wilkerson returns for follow-up. Symptoms of left abdominal pain radiating into the pelvis has further improved with only minimal occasional "twinges" felt. The patient is not requiring pain medicine. He denies any numbness or tingling. He remains on Coumadin for treatment of atrial fibrillation and a mechanical aortic valve. He recently saw Dr. Jens Wilkerson in follow- up. He also saw Dr. Laverle Wilkerson recently and feels that addition of Tamsulosin has helped with bladder emptying, urgency and frequency.  Review of Systems: No hematuria or dysuria. No fever or chills.  Exam: Vital signs: Blood pressure 157/92, pulse 62, respirations 15, temperature 97.5, oxygen saturation 97% on room air. General: No distress. Abdomen: Soft and nontender. Minimal tenderness was elicited in the left flank region with palpation.  Labs: BUN 22, creatinine 0.74 and estimated GFR greater than 89 ml/minute.  Imaging: Follow-up imaging was performed on 06/28/2012. Small pulmonary nodules on the right were felt to be postinflammatory. Left posterior renal cryoablation zone shows retraction of scar with no evidence of abnormal enhancement. The posterior left retroperitoneal hematoma shows further retraction in size.  Assessment and Plan: Mr. Juan Wilkerson continues to improve symptomatically after left renal cryoablation. Postprocedural retroperitoneal hematoma continues to decrease in size with no signs of further bleeding. The ablation defect is also retracting shows no evidence of abnormal enhancement to suggest residual or recurrent tumor. I have recommended a followup CT in December to follow the ablation site.

## 2012-08-05 ENCOUNTER — Ambulatory Visit (INDEPENDENT_AMBULATORY_CARE_PROVIDER_SITE_OTHER): Payer: Medicare Other | Admitting: *Deleted

## 2012-08-05 ENCOUNTER — Ambulatory Visit: Payer: Medicare Other

## 2012-08-05 DIAGNOSIS — Z954 Presence of other heart-valve replacement: Secondary | ICD-10-CM

## 2012-08-05 DIAGNOSIS — Z7901 Long term (current) use of anticoagulants: Secondary | ICD-10-CM

## 2012-09-02 ENCOUNTER — Ambulatory Visit (INDEPENDENT_AMBULATORY_CARE_PROVIDER_SITE_OTHER): Payer: Medicare Other | Admitting: *Deleted

## 2012-09-02 DIAGNOSIS — Z7901 Long term (current) use of anticoagulants: Secondary | ICD-10-CM

## 2012-09-02 DIAGNOSIS — Z954 Presence of other heart-valve replacement: Secondary | ICD-10-CM

## 2012-09-02 LAB — POCT INR: INR: 3.1

## 2012-09-05 ENCOUNTER — Ambulatory Visit: Payer: Medicare Other

## 2012-09-25 ENCOUNTER — Other Ambulatory Visit: Payer: Self-pay | Admitting: Family Medicine

## 2012-09-25 DIAGNOSIS — N400 Enlarged prostate without lower urinary tract symptoms: Secondary | ICD-10-CM

## 2012-09-29 ENCOUNTER — Other Ambulatory Visit: Payer: Self-pay | Admitting: *Deleted

## 2012-09-29 DIAGNOSIS — I4891 Unspecified atrial fibrillation: Secondary | ICD-10-CM

## 2012-09-29 DIAGNOSIS — I10 Essential (primary) hypertension: Secondary | ICD-10-CM

## 2012-09-29 DIAGNOSIS — N400 Enlarged prostate without lower urinary tract symptoms: Secondary | ICD-10-CM

## 2012-09-29 MED ORDER — PRAVASTATIN SODIUM 40 MG PO TABS: ORAL_TABLET | ORAL | Status: DC

## 2012-09-29 MED ORDER — TAMSULOSIN HCL 0.4 MG PO CAPS: 0.4000 mg | ORAL_CAPSULE | Freq: Every day | ORAL | Status: DC

## 2012-09-29 MED ORDER — METOPROLOL SUCCINATE ER 50 MG PO TB24: 50.0000 mg | ORAL_TABLET | Freq: Two times a day (BID) | ORAL | Status: DC

## 2012-09-29 MED ORDER — AMLODIPINE BESYLATE 5 MG PO TABS: 5.0000 mg | ORAL_TABLET | Freq: Every day | ORAL | Status: DC

## 2012-09-30 ENCOUNTER — Ambulatory Visit (INDEPENDENT_AMBULATORY_CARE_PROVIDER_SITE_OTHER): Payer: Medicare Other | Admitting: *Deleted

## 2012-09-30 DIAGNOSIS — Z7901 Long term (current) use of anticoagulants: Secondary | ICD-10-CM

## 2012-09-30 DIAGNOSIS — Z954 Presence of other heart-valve replacement: Secondary | ICD-10-CM

## 2012-10-28 ENCOUNTER — Ambulatory Visit (INDEPENDENT_AMBULATORY_CARE_PROVIDER_SITE_OTHER): Payer: Medicare Other | Admitting: *Deleted

## 2012-10-28 DIAGNOSIS — Z7901 Long term (current) use of anticoagulants: Secondary | ICD-10-CM

## 2012-10-28 DIAGNOSIS — Z954 Presence of other heart-valve replacement: Secondary | ICD-10-CM

## 2012-10-30 ENCOUNTER — Other Ambulatory Visit: Payer: Self-pay | Admitting: Family Medicine

## 2012-11-25 ENCOUNTER — Ambulatory Visit (INDEPENDENT_AMBULATORY_CARE_PROVIDER_SITE_OTHER): Payer: Medicare Other | Admitting: *Deleted

## 2012-11-25 DIAGNOSIS — Z954 Presence of other heart-valve replacement: Secondary | ICD-10-CM

## 2012-11-25 DIAGNOSIS — Z7901 Long term (current) use of anticoagulants: Secondary | ICD-10-CM

## 2012-11-25 DIAGNOSIS — Z23 Encounter for immunization: Secondary | ICD-10-CM

## 2012-11-25 LAB — POCT INR: INR: 3.5

## 2012-11-29 ENCOUNTER — Other Ambulatory Visit: Payer: Self-pay | Admitting: Cardiology

## 2012-12-06 ENCOUNTER — Other Ambulatory Visit: Payer: Self-pay | Admitting: Family Medicine

## 2012-12-23 ENCOUNTER — Ambulatory Visit: Payer: Medicare Other

## 2012-12-26 ENCOUNTER — Ambulatory Visit (INDEPENDENT_AMBULATORY_CARE_PROVIDER_SITE_OTHER): Payer: Medicare Other | Admitting: *Deleted

## 2012-12-26 DIAGNOSIS — Z7901 Long term (current) use of anticoagulants: Secondary | ICD-10-CM

## 2012-12-26 DIAGNOSIS — Z954 Presence of other heart-valve replacement: Secondary | ICD-10-CM

## 2013-01-03 ENCOUNTER — Encounter: Payer: Self-pay | Admitting: Cardiology

## 2013-01-03 ENCOUNTER — Ambulatory Visit (INDEPENDENT_AMBULATORY_CARE_PROVIDER_SITE_OTHER): Payer: Medicare Other | Admitting: Cardiology

## 2013-01-03 VITALS — BP 140/82 | HR 65 | Ht 76.0 in | Wt 198.4 lb

## 2013-01-03 DIAGNOSIS — I729 Aneurysm of unspecified site: Secondary | ICD-10-CM

## 2013-01-03 DIAGNOSIS — I4891 Unspecified atrial fibrillation: Secondary | ICD-10-CM

## 2013-01-03 DIAGNOSIS — Z954 Presence of other heart-valve replacement: Secondary | ICD-10-CM

## 2013-01-03 LAB — CBC WITH DIFFERENTIAL/PLATELET
Basophils Absolute: 0 10*3/uL (ref 0.0–0.1)
Basophils Relative: 0.5 % (ref 0.0–3.0)
Eosinophils Relative: 7.3 % — ABNORMAL HIGH (ref 0.0–5.0)
HCT: 42.7 % (ref 39.0–52.0)
Lymphs Abs: 0.7 10*3/uL (ref 0.7–4.0)
MCV: 90.5 fl (ref 78.0–100.0)
Monocytes Absolute: 0.5 10*3/uL (ref 0.1–1.0)
RBC: 4.72 Mil/uL (ref 4.22–5.81)
WBC: 6.3 10*3/uL (ref 4.5–10.5)

## 2013-01-03 LAB — BASIC METABOLIC PANEL
BUN: 19 mg/dL (ref 6–23)
CO2: 29 mEq/L (ref 19–32)
Chloride: 103 mEq/L (ref 96–112)
Potassium: 4.2 mEq/L (ref 3.5–5.1)
Sodium: 139 mEq/L (ref 135–145)

## 2013-01-03 NOTE — Patient Instructions (Signed)
Your physician wants you to follow-up in: ONE YEAR WITH DR Shelda Pal will receive a reminder letter in the mail two months in advance. If you don't receive a letter, please call our office to schedule the follow-up appointment.   Your physician recommends that you HAVE LAB WORK TODAY  Your physician has requested that you have an echocardiogram. Echocardiography is a painless test that uses sound waves to create images of your heart. It provides your doctor with information about the size and shape of your heart and how well your heart's chambers and valves are working. This procedure takes approximately one hour. There are no restrictions for this procedure.   CTA OF CHEST AND ABDOMEN W AND W/O CONTRAST TO FOLLOW UP ANEURYSM

## 2013-01-03 NOTE — Assessment & Plan Note (Signed)
Plan repeat CTA of chest, abdominal aorta and iliacs.

## 2013-01-03 NOTE — Progress Notes (Signed)
HPI: FU atrial fibrillation/flutter and St Jude mechanical aortic valve replacement. History of Marfan syndrome, status post St. Jude AVR and aortic reconstruction as well as resection of an aortic aneurysm in 1995, permanent atrial fibrillation/flutter, chronic Coumadin therapy. Echo 9/13: Mild LVH, EF 50-55%, prosthetic AVR not well seen but with normal gradients, mild LAE. 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 Patient had cryoablation for his renal mass by Dr. Lorin Picket. Patient last seen in April of 2014. Since then, the patient denies any dyspnea on exertion, orthopnea, PND, pedal edema, palpitations, syncope or chest pain.    Current Outpatient Prescriptions  Medication Sig Dispense Refill  . acetaminophen (TYLENOL) 500 MG tablet Take 1,000 mg by mouth 2 (two) times daily as needed. As needed for pain      . amLODipine (NORVASC) 5 MG tablet Take 1 tablet (5 mg total) by mouth daily.  90 tablet  3  . Glucosamine-Chondroit-Vit C-Mn (GLUCOSAMINE-CHONDROITIN) TABS Take 2 tablets by mouth every morning.       . hydrocortisone 1 % cream Apply topically. Apply as needed for itching.      . metoprolol succinate (TOPROL-XL) 50 MG 24 hr tablet Take 1 tablet (50 mg total) by mouth 2 (two) times daily. Take with or immediately following a meal.  180 tablet  1  . Multiple Vitamin (MULTIVITAMIN) capsule Take 1 capsule by mouth every morning.       . pravastatin (PRAVACHOL) 40 MG tablet TAKE 1 TABLET BY MOUTH EVERY DAY  90 tablet  3  . tamsulosin (FLOMAX) 0.4 MG CAPS Take 1 capsule (0.4 mg total) by mouth daily.  30 capsule  0  . warfarin (COUMADIN) 3 MG tablet TAKE 1 TABLET BY MOUTH DAILY. TAKE AS DIRECTED.  40 tablet  3  . polyethylene glycol (MIRALAX / GLYCOLAX) packet Take 17 g by mouth daily.  Increase to BID if needed.  100 each  5   No current facility-administered medications for this visit.     Past Medical History  Diagnosis Date  . Marfan's syndrome with aortic dilation   . Hypertension   . Aortic aneurysm 1994    From presumed Marfan's Syndrome.  Followed by Dr Jens Som.  Aneurysm involving the proximal descending thoracic aorta .    Marland Kitchen Pulmonary nodules     Found on chest CT 08/2007.  Monitoring with yearly CT.  3.70mm nodule in R iddle lobe.  Marland Kitchen Hyperlipidemia   . Atrial fibrillation     Followed by Dr Jens Som.  On coumadin.  . Atrial flutter     Followed by Dr Jens Som  . Osteoarthritis   . History of blood transfusion     Past Surgical History  Procedure Laterality Date  . Virtual colonoscopy  11/01/2005    Pt on chronic coumadin for Aortic valve replacement and Atrial fibrilliation therefore at high risk if stopped.  Need to repeat every 5 yrs.   . Aortic valve replacement  03/1993    St Jude Valve  . Hip fracture surgery  03/1992    s/p R hip Fx  . Vascular surgery    . Cardiac valve replacement    . Kidney surgery    . Coronary artery bypass graft      History   Social History  . Marital Status: Divorced  Spouse Name: N/A    Number of Children: N/A  . Years of Education: College   Occupational History  .     Social History Main Topics  . Smoking status: Never Smoker   . Smokeless tobacco: Never Used  . Alcohol Use: No  . Drug Use: No  . Sexual Activity: No   Other Topics Concern  . Not on file   Social History Narrative   Divorced, lives alone. Has a daughter (pt does not have contact with her, has not seen her since she was 26) .    Studied music in college.   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   No smoking. No alcohol use. No recreational drugs.             ROS: no fevers or chills, productive cough, hemoptysis, dysphasia, odynophagia, melena,  hematochezia, dysuria, hematuria, rash, seizure activity, orthopnea, PND, pedal edema, claudication. Remaining systems are negative.  Physical Exam: Well-developed well-nourished in no acute distress.  Skin is warm and dry.  HEENT is normal.  Neck is supple.  Chest is clear to auscultation with normal expansion.  Cardiovascular exam is irregular, crisp mechanical valve sound Abdominal exam nontender or distended. No masses palpated. Extremities show no edema. neuro grossly intact  ECG atrial flutter at a rate of 65. Right bundle branch block, left anterior fascicular block.

## 2013-01-03 NOTE — Assessment & Plan Note (Signed)
Continue beta blocker for rate control. Continue Coumadin. Check hemoglobin. 

## 2013-01-03 NOTE — Assessment & Plan Note (Signed)
Blood pressure controlled. Continue present medications. Check potassium and renal function. 

## 2013-01-03 NOTE — Assessment & Plan Note (Signed)
Plan repeat echocardiogram. Continue SBE prophylaxis. 

## 2013-01-03 NOTE — Assessment & Plan Note (Signed)
Continue statin. 

## 2013-01-09 ENCOUNTER — Other Ambulatory Visit: Payer: Self-pay | Admitting: Cardiology

## 2013-01-12 ENCOUNTER — Inpatient Hospital Stay: Admission: RE | Admit: 2013-01-12 | Payer: Medicare Other | Source: Ambulatory Visit

## 2013-01-12 ENCOUNTER — Ambulatory Visit (INDEPENDENT_AMBULATORY_CARE_PROVIDER_SITE_OTHER)
Admission: RE | Admit: 2013-01-12 | Discharge: 2013-01-12 | Disposition: A | Discharge status: Home / Self Care | Payer: Medicare Other | Source: Ambulatory Visit | Attending: Cardiology | Admitting: Cardiology

## 2013-01-12 DIAGNOSIS — I729 Aneurysm of unspecified site: Secondary | ICD-10-CM

## 2013-01-12 MED ORDER — IOHEXOL 350 MG/ML SOLN
100.0000 mL | Freq: Once | INTRAVENOUS | Status: AC | PRN
  Administered 2013-01-12: 100 mL via INTRAVENOUS

## 2013-01-17 ENCOUNTER — Ambulatory Visit (HOSPITAL_COMMUNITY): Payer: Medicare Other | Attending: Cardiology

## 2013-01-17 DIAGNOSIS — Z951 Presence of aortocoronary bypass graft: Secondary | ICD-10-CM | POA: Insufficient documentation

## 2013-01-17 DIAGNOSIS — Z954 Presence of other heart-valve replacement: Secondary | ICD-10-CM | POA: Insufficient documentation

## 2013-01-17 DIAGNOSIS — I079 Rheumatic tricuspid valve disease, unspecified: Secondary | ICD-10-CM | POA: Insufficient documentation

## 2013-01-17 DIAGNOSIS — I359 Nonrheumatic aortic valve disorder, unspecified: Secondary | ICD-10-CM | POA: Insufficient documentation

## 2013-01-17 NOTE — Progress Notes (Signed)
Echocardiogram performed.  

## 2013-01-18 ENCOUNTER — Other Ambulatory Visit (HOSPITAL_COMMUNITY): Payer: Self-pay | Admitting: Interventional Radiology

## 2013-01-18 DIAGNOSIS — D3002 Benign neoplasm of left kidney: Secondary | ICD-10-CM

## 2013-01-19 ENCOUNTER — Other Ambulatory Visit: Payer: Medicare Other

## 2013-01-24 ENCOUNTER — Ambulatory Visit (INDEPENDENT_AMBULATORY_CARE_PROVIDER_SITE_OTHER): Payer: Medicare Other | Admitting: *Deleted

## 2013-01-24 DIAGNOSIS — Z954 Presence of other heart-valve replacement: Secondary | ICD-10-CM

## 2013-01-24 DIAGNOSIS — Z7901 Long term (current) use of anticoagulants: Secondary | ICD-10-CM

## 2013-01-24 LAB — POCT INR: INR: 3.5

## 2013-01-31 ENCOUNTER — Inpatient Hospital Stay: Admission: RE | Admit: 2013-01-31 | Payer: Medicare Other | Source: Ambulatory Visit

## 2013-02-08 ENCOUNTER — Ambulatory Visit
Admission: RE | Admit: 2013-02-08 | Discharge: 2013-02-08 | Disposition: A | Discharge status: Home / Self Care | Payer: Medicare Other | Source: Ambulatory Visit | Attending: Interventional Radiology | Admitting: Interventional Radiology

## 2013-02-08 DIAGNOSIS — D3002 Benign neoplasm of left kidney: Secondary | ICD-10-CM

## 2013-02-21 ENCOUNTER — Ambulatory Visit (INDEPENDENT_AMBULATORY_CARE_PROVIDER_SITE_OTHER): Payer: Medicare Other | Admitting: *Deleted

## 2013-02-21 DIAGNOSIS — Z7901 Long term (current) use of anticoagulants: Secondary | ICD-10-CM

## 2013-02-21 DIAGNOSIS — Z954 Presence of other heart-valve replacement: Secondary | ICD-10-CM

## 2013-02-21 LAB — POCT INR: INR: 3.2

## 2013-03-21 ENCOUNTER — Ambulatory Visit (INDEPENDENT_AMBULATORY_CARE_PROVIDER_SITE_OTHER): Payer: Medicare Other | Admitting: *Deleted

## 2013-03-21 DIAGNOSIS — Z954 Presence of other heart-valve replacement: Secondary | ICD-10-CM

## 2013-03-21 DIAGNOSIS — Z7901 Long term (current) use of anticoagulants: Secondary | ICD-10-CM

## 2013-03-21 LAB — POCT INR: INR: 3.4

## 2013-03-23 ENCOUNTER — Ambulatory Visit: Payer: Medicare Other

## 2013-04-18 ENCOUNTER — Ambulatory Visit (INDEPENDENT_AMBULATORY_CARE_PROVIDER_SITE_OTHER): Payer: Medicare Other | Admitting: *Deleted

## 2013-04-18 DIAGNOSIS — Z954 Presence of other heart-valve replacement: Secondary | ICD-10-CM

## 2013-04-18 DIAGNOSIS — Z7901 Long term (current) use of anticoagulants: Secondary | ICD-10-CM

## 2013-04-18 LAB — POCT INR: INR: 2.2

## 2013-04-22 ENCOUNTER — Encounter (HOSPITAL_COMMUNITY): Payer: Self-pay | Admitting: Emergency Medicine

## 2013-04-22 ENCOUNTER — Emergency Department (HOSPITAL_COMMUNITY): Payer: Medicare Other

## 2013-04-22 ENCOUNTER — Emergency Department (HOSPITAL_COMMUNITY)
Admission: EM | Admit: 2013-04-22 | Discharge: 2013-04-22 | Disposition: A | Discharge status: Home / Self Care | Payer: Medicare Other | Attending: Emergency Medicine | Admitting: Emergency Medicine

## 2013-04-22 DIAGNOSIS — I4891 Unspecified atrial fibrillation: Secondary | ICD-10-CM | POA: Insufficient documentation

## 2013-04-22 DIAGNOSIS — M1990 Unspecified osteoarthritis, unspecified site: Secondary | ICD-10-CM | POA: Insufficient documentation

## 2013-04-22 DIAGNOSIS — E785 Hyperlipidemia, unspecified: Secondary | ICD-10-CM | POA: Insufficient documentation

## 2013-04-22 DIAGNOSIS — I10 Essential (primary) hypertension: Secondary | ICD-10-CM | POA: Insufficient documentation

## 2013-04-22 DIAGNOSIS — R5381 Other malaise: Secondary | ICD-10-CM | POA: Insufficient documentation

## 2013-04-22 DIAGNOSIS — G609 Hereditary and idiopathic neuropathy, unspecified: Secondary | ICD-10-CM | POA: Insufficient documentation

## 2013-04-22 DIAGNOSIS — G629 Polyneuropathy, unspecified: Secondary | ICD-10-CM

## 2013-04-22 DIAGNOSIS — Z79899 Other long term (current) drug therapy: Secondary | ICD-10-CM | POA: Insufficient documentation

## 2013-04-22 DIAGNOSIS — Z951 Presence of aortocoronary bypass graft: Secondary | ICD-10-CM | POA: Insufficient documentation

## 2013-04-22 DIAGNOSIS — R5383 Other fatigue: Secondary | ICD-10-CM

## 2013-04-22 DIAGNOSIS — Q8740 Marfan's syndrome, unspecified: Secondary | ICD-10-CM | POA: Insufficient documentation

## 2013-04-22 DIAGNOSIS — Z7901 Long term (current) use of anticoagulants: Secondary | ICD-10-CM | POA: Insufficient documentation

## 2013-04-22 LAB — PROTIME-INR
INR: 2.61 — AB (ref 0.00–1.49)
PROTHROMBIN TIME: 27 s — AB (ref 11.6–15.2)

## 2013-04-22 NOTE — ED Notes (Signed)
Pt from home c/o sudden onset numbness in L fingers that started this am. Pt denies CP, SOB, N/V, dizziness. Pt reports that he was playing his harmonica and the 3rd, 4th and 5th digits of his L hand suddenly went numb. Pt is A&O and in NAD.

## 2013-04-22 NOTE — ED Provider Notes (Signed)
Pt seen and examined.  History reviewed.  Noted that LUE ulnar 3 digits became weak today.  Slightly less feeling ve index and thumb.  Noted while playing harmonica.  No neck, arm, elbow, wrist pain.  No repeatitive elbow contact. History of chronic Afib and h/o AO valve replacement.  Chronic coumadin.  Last INR 2 wks, 2.1.  No lower readings.  Exam:  C7-8/Ulnar weakness with weak flexion, weak intrinsics, decrrreased sensation.  Normal CRF and R/U Pulses.  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  With his history we will obtain head CT, EKG, and INR to ensure that this is not lacunar infarct.    Tanna Furry, MD 04/22/13 1155

## 2013-04-22 NOTE — Discharge Instructions (Signed)

## 2013-04-22 NOTE — ED Provider Notes (Signed)
CSN: [id number]     Arrival date & time 04/22/13  1030 History   First MD Initiated Contact with Patient 04/22/13 1044     Chief Complaint  Patient presents with  . Numbness     (Consider location/radiation/quality/duration/timing/severity/associated sxs/prior Treatment) Patient is a 73 y.o. male presenting with weakness. The history is provided by the patient. No language interpreter was used.  Weakness This is a new problem. The current episode started today. Associated symptoms include weakness. Pertinent negatives include no arthralgias, fever, headaches, myalgias or numbness. Associated symptoms comments: Sudden onset of weakness to the 3rd to the 5th digits of left hand while playing the harmonica this morning. No pain, or numbness. He denies similar symptoms in the pain. He denies neck pain, headache or weakness of other extremity..    Past Medical History  Diagnosis Date  . Marfan's syndrome with aortic dilation   . Hypertension   . Aortic aneurysm 1994    From presumed Marfan's Syndrome.  Followed by Dr Stanford Breed.  Aneurysm involving the proximal descending thoracic aorta .    Marland Kitchen Pulmonary nodules     Found on chest CT 08/2007.  Monitoring with yearly CT.  3.66mm nodule in R iddle lobe.  Marland Kitchen Hyperlipidemia   . Atrial fibrillation     Followed by Dr Stanford Breed.  On coumadin.  . Atrial flutter     Followed by Dr Stanford Breed  . Osteoarthritis   . History of blood transfusion    Past Surgical History  Procedure Laterality Date  . Virtual colonoscopy  11/01/2005    Pt on chronic coumadin for Aortic valve replacement and Atrial fibrilliation therefore at high risk if stopped.  Need to repeat every 5 yrs.   . Aortic valve replacement  03/1993    St Jude Valve  . Hip fracture surgery  03/1992    s/p R hip Fx  . Vascular surgery    . Cardiac valve replacement    . Kidney surgery    . Coronary artery bypass graft     Family History  Problem Relation Age of Onset  . Cancer Brother   .  Hypertension Brother   . Aortic aneurysm Maternal Aunt    History  Substance Use Topics  . Smoking status: Never Smoker   . Smokeless tobacco: Never Used  . Alcohol Use: No    Review of Systems  Constitutional: Negative for fever.  Musculoskeletal: Negative for arthralgias and myalgias.  Skin: Negative for color change and wound.  Neurological: Positive for weakness. Negative for numbness and headaches.      Allergies  Review of patient's allergies indicates no known allergies.  Home Medications   Current Outpatient Rx  Name  Route  Sig  Dispense  Refill  . acetaminophen (TYLENOL) 500 MG tablet   Oral   Take 1,000 mg by mouth 2 (two) times daily as needed. As needed for pain         . amLODipine (NORVASC) 5 MG tablet   Oral   Take 1 tablet (5 mg total) by mouth daily.   90 tablet   3   . Glucosamine-Chondroit-Vit C-Mn (GLUCOSAMINE-CHONDROITIN) TABS   Oral   Take 2 tablets by mouth every morning.          . hydrocortisone 1 % cream   Topical   Apply topically. Apply as needed for itching.         . metoprolol succinate (TOPROL-XL) 50 MG 24 hr tablet  TAKE 1 TABLET BY MOUTH 2 TIMES DAILY. TAKE WITH OR IMMEDIATELY FOLLOWING A MEAL.   180 tablet   3   . Multiple Vitamin (MULTIVITAMIN) capsule   Oral   Take 1 capsule by mouth every morning.          . polyethylene glycol (MIRALAX / GLYCOLAX) packet   Oral   Take 17 g by mouth daily. Increase to BID if needed.   100 each   5   . pravastatin (PRAVACHOL) 40 MG tablet      TAKE 1 TABLET BY MOUTH EVERY DAY   90 tablet   3   . tamsulosin (FLOMAX) 0.4 MG CAPS   Oral   Take 1 capsule (0.4 mg total) by mouth daily.   30 capsule   0   . warfarin (COUMADIN) 3 MG tablet      TAKE 1 TABLET BY MOUTH DAILY. TAKE AS DIRECTED.   40 tablet   3    BP 150/97  Pulse 104  Temp(Src) 98.2 F (36.8 C) (Oral)  Resp 20  SpO2 98% Physical Exam  Constitutional: He is oriented to person, place, and time.  He appears well-developed and well-nourished. No distress.  Cardiovascular:  No carotid bruit.  Pulmonary/Chest: Effort normal.  Musculoskeletal:  No midline or paracervical tenderness.   Neurological: He is alert and oriented to person, place, and time.  Weakness greater on extension than flexion of digits 3-5 of the left hand. Sensory intact. 1st and 2nd digits without deficit of strength. No median nerve tenderness and no worsening symptoms with compression of median nerve. Cranial nerves 3-12 grossly intact.    Skin: Skin is warm and dry.  Psychiatric: He has a normal mood and affect.    ED Course  Procedures (including critical care time) Labs Review Labs Reviewed - No data to display Imaging Review No results found. Results for orders placed during the hospital encounter of 04/22/13  [facility]      Result Value Ref Range   Prothrombin Time 27.0 (*) 11.6 - 15.2 seconds   INR 2.61 (*) 0.00 - 1.49   Ct Head Wo Contrast  04/22/2013   CLINICAL DATA:  Dizziness. History of Marfan's syndrome. Sudden onset of left finger numbness.  EXAM: CT HEAD WITHOUT CONTRAST  TECHNIQUE: Contiguous axial images were obtained from the base of the skull through the vertex without intravenous contrast.  COMPARISON:  None.  FINDINGS: Image 63 of series 2 demonstrates a relatively well-defined area of low attenuation in the subcortical white matter of the right frontal lobe, compatible with an old infarction. No acute intracranial abnormalities. Specifically, no evidence of acute intracranial hemorrhage, no definite findings of acute/subacute cerebral ischemia, no mass, mass effect, hydrocephalus or abnormal intra or extra-axial fluid collections. Visualized paranasal sinuses and mastoids are well pneumatized. No acute displaced skull fractures are identified.  IMPRESSION: 1. No acute intracranial abnormalities. 2. 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   Electronically Signed    By: Vinnie Langton M.D.   On: 04/22/2013 12:29   EKG Interpretation   None       MDM   Final diagnoses:  None    1. Peripheral neuropathy  Symptoms limited to left hand, specific digits, doubt CNS origin, suspect peripheral nerve palsy. Will require nerve conduction study to determine which can be done on an outpatient basis. Stable for discharge.     Dewaine Oats, PA-C 04/22/13 1342

## 2013-04-24 NOTE — ED Provider Notes (Signed)
Medical screening examination/treatment/procedure(s) were performed by non-physician practitioner and as supervising physician I was immediately available for consultation/collaboration.  EKG Interpretation    Date/Time:  Saturday April 22 2013 12:07:20 EST Ventricular Rate:  62 PR Interval:    QRS Duration: 114 QT Interval:  452 QTC Calculation: 459 R Axis:   -72 Text Interpretation:  Atrial flutter/fibrillation Incomplete RBBB and LAFB Abnormal R-wave progression, late transition Probable left ventricular hypertrophy Baseline wander in lead(s) V4 Confirmed by Jeneen Rinks  MD, [place] ([id number]) on 04/22/2013 1:46:15 PM              Tanna Furry, MD 04/24/13 2251

## 2013-04-28 ENCOUNTER — Other Ambulatory Visit: Payer: Self-pay | Admitting: *Deleted

## 2013-04-28 MED ORDER — WARFARIN SODIUM 3 MG PO TABS: 3.0000 mg | ORAL_TABLET | Freq: Every day | ORAL | Status: DC

## 2013-05-02 ENCOUNTER — Ambulatory Visit (INDEPENDENT_AMBULATORY_CARE_PROVIDER_SITE_OTHER): Payer: Medicare Other | Admitting: *Deleted

## 2013-05-02 ENCOUNTER — Ambulatory Visit: Payer: Self-pay | Admitting: Neurology

## 2013-05-02 ENCOUNTER — Ambulatory Visit (INDEPENDENT_AMBULATORY_CARE_PROVIDER_SITE_OTHER): Payer: Medicare Other | Admitting: Neurology

## 2013-05-02 ENCOUNTER — Encounter: Payer: Self-pay | Admitting: Neurology

## 2013-05-02 VITALS — BP 122/83 | HR 98 | Ht 75.5 in | Wt 204.0 lb

## 2013-05-02 DIAGNOSIS — R29898 Other symptoms and signs involving the musculoskeletal system: Secondary | ICD-10-CM

## 2013-05-02 DIAGNOSIS — Z7901 Long term (current) use of anticoagulants: Secondary | ICD-10-CM

## 2013-05-02 DIAGNOSIS — M6281 Muscle weakness (generalized): Secondary | ICD-10-CM

## 2013-05-02 DIAGNOSIS — Z954 Presence of other heart-valve replacement: Secondary | ICD-10-CM

## 2013-05-02 LAB — POCT INR: INR: 3.5

## 2013-05-02 NOTE — Progress Notes (Signed)
[facility]    Provider:  Dr Janann Colonel Referring Provider: Beverlyn Roux, MD Primary Care Physician:  Lorin Glass, MD  CC:  LUE weakness  HPI:  Juan Wilkerson is a 73 y.o. male here as a referral from Dr. Sherril Cong for left hand weakness  One week ago was playing harmonica and noted acute onset of the 3rd, 4th and 5th digits of the left hand. Had decreased sensation in that hand. Symptoms have slightly improved but continues to have difficulty with fine motor tasks with those fingers. The difficulty is most notable when trying to play an instrument but also notes it with different fine motor tasks, such as buttoning his shirt. Prior to onset denies any abnormal sleeping positions or pressure on the LUE. Notes chronic history of cervical radiculopathy type symptoms and decreased ROM in left shoulder.   Currently taking coumadin for history of mechanical heart valve.   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 + joint pain, aching muscles, impotence, rash, itching   History   Social History  . Marital Status: Divorced    Spouse Name: N/A    Number of Children: N/A  . Years of Education: College   Occupational History  .     Social History Main Topics  . Smoking status: Never Smoker   . Smokeless tobacco: Never Used  . Alcohol Use: No  . Drug Use: No  . Sexual Activity: No   Other Topics Concern  . Not on file   Social History Narrative   Divorced, lives alone. Has a daughter (pt does not have contact with her, has not seen her since she was 44) .    Studied music in college.   Plays oboe with [employer].    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   No smoking. No alcohol use. No recreational drugs.             Family History  Problem Relation Age  of Onset  . Cancer Brother   . Hypertension Brother   . Aortic aneurysm Maternal Aunt     Past Medical History  Diagnosis Date  . Marfan's syndrome with aortic dilation   . Hypertension   . Aortic aneurysm 1994    From presumed Marfan's Syndrome.  Followed by Dr Stanford Breed.  Aneurysm involving the proximal descending thoracic aorta .    Marland Kitchen Pulmonary nodules     Found on chest CT 08/2007.  Monitoring with yearly CT.  3.56mm nodule in R iddle lobe.  Marland Kitchen Hyperlipidemia   . Atrial fibrillation     Followed by Dr Stanford Breed.  On coumadin.  . Atrial flutter     Followed by Dr Stanford Breed  . Osteoarthritis   . History of blood transfusion     Past Surgical History  Procedure Laterality Date  . Virtual colonoscopy  11/01/2005    Pt on chronic coumadin for Aortic valve replacement and Atrial fibrilliation therefore at high risk if stopped.  Need to repeat every 5 yrs.   . Aortic valve replacement  03/1993    St Jude Valve  . Hip fracture surgery  03/1992    s/p R hip Fx  . Vascular surgery    . Cardiac valve replacement    . Kidney surgery    . Coronary artery bypass graft  Current Outpatient Prescriptions  Medication Sig Dispense Refill  . acetaminophen (TYLENOL) 500 MG tablet Take 1,000 mg by mouth 2 (two) times daily as needed. As needed for pain      . amLODipine (NORVASC) 5 MG tablet       . Chlorpheniramine Maleate 12 MG TBCR Take 12 mg by mouth.      . Glucosamine-Chondroit-Vit C-Mn (GLUCOSAMINE-CHONDROITIN) TABS Take 2 tablets by mouth every morning.       . hydrocortisone 1 % cream Apply topically. Apply as needed for itching.      . metoprolol succinate (TOPROL-XL) 50 MG 24 hr tablet TAKE 1 TABLET BY MOUTH 2 TIMES DAILY. TAKE WITH OR IMMEDIATELY FOLLOWING A MEAL.  180 tablet  3  . Multiple Vitamin (MULTIVITAMIN) capsule Take 1 capsule by mouth every morning.       . pravastatin (PRAVACHOL) 40 MG tablet TAKE 1 TABLET BY MOUTH EVERY DAY  90 tablet  3  . tamsulosin (FLOMAX) 0.4 MG  CAPS Take 1 capsule (0.4 mg total) by mouth daily.  30 capsule  0  . warfarin (COUMADIN) 3 MG tablet Take 1-1.5 tablets (3-4.5 mg total) by mouth daily. Take 1 and a half tablets (4.5 mg) on Saturday, On all other days take 1 tablet (3mg )  45 tablet  11   No current facility-administered medications for this visit.    Allergies as of 05/02/2013 - Review Complete 05/02/2013  Allergen Reaction Noted  . Lemon oil Other (See Comments) 04/22/2013    Vitals: BP 122/83  Pulse 98  Ht 6' 3.5" (1.918 m)  Wt 204 lb (92.534 kg)  BMI 25.15 kg/m2 Last Weight:  Wt Readings from Last 1 Encounters:  05/02/13 204 lb (92.534 kg)   Last Height:   Ht Readings from Last 1 Encounters:  05/02/13 6' 3.5" (1.918 m)     Physical exam: Exam: Gen: NAD, conversant Eyes: anicteric sclerae, moist conjunctivae HENT: Atraumatic, oropharynx clear Neck: Trachea midline; supple,  Lungs: CTA, no wheezing, rales, rhonic                          CV: harsh systolic murmur Abdomen: Soft, non-tender;  Extremities: No peripheral edema  Skin: Normal temperature, no rash,  Psych: Appropriate affect, pleasant  Neuro: MS: AA&Ox3, appropriately interactive, normal affect   Speech: fluent w/o paraphasic error  Memory: good recent and remote recall  CN: PERRL, EOMI no nystagmus, no ptosis, sensation intact to LT V1-V3 bilat, face symmetric, no weakness, hearing grossly intact, palate elevates symmetrically, shoulder shrug 5/5 bilat,  tongue protrudes midline, no fasiculations noted.  Motor: mild atrophy left thenar eminence Strength: Decreased ROM and strength in Left 3rd, 4th, 5th digits  Coord: impaired finger tapping with left hand compared to right  Reflexes: symmetrical, bilat downgoing toes  Sens: LT intact in all extremities  Gait: posture, stance, stride and arm-swing normal. Tandem gait intact. Able to walk on heels and toes. Romberg absent.   Assessment:  After physical and neurologic  examination, review of laboratory studies, imaging, neurophysiology testing and pre-existing records, assessment will be reviewed on the problem list.  Plan:  Treatment plan and additional workup will be reviewed under Problem List.  1)LUE weakness  73y/o gentleman presenting for initial evaluation of L hand weakness and impaired dexterity involving the 3rd, 4th and 5th digits. Acute onset raises concern over possible CVA though rest of history and exam findings not consistent with this diagnosis. Will check MRI  to rule this out. Differential would include a radiculopathy/neuropathy vs a focal hand dystonia/musicians dystonia. Will check EMG/NCS and place referral for OT. 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   Elspeth ChoPeter Ferd Horrigan, DO  [facility] [address] [place], [place] [id number]  Phone [phone] Fax [phone]

## 2013-05-02 NOTE — Patient Instructions (Addendum)
Overall you are doing fairly well but I do want to suggest a few things today:   As far as diagnostic testing:  1)Please schedule an EMG/Nerve conduction study upon checking out 2)I would like you to have a brain MRI, we will contact you to schedule this  I would like you to work with occupational therapy, they will call you to schedule  I would like to see you back in 1 month, sooner if we need to. Please call us with any interim questions, concerns, problems, updates or refill requests.   My clinical assistant and will answer any of your questions and relay your messages to me and also relay most of my messages to you.   Our phone number is [phone]. We also have an after hours call service for urgent matters and there is a physician on-call for urgent questions. For any emergencies you know to call 911 or go to the nearest emergency room

## 2013-05-03 ENCOUNTER — Ambulatory Visit (INDEPENDENT_AMBULATORY_CARE_PROVIDER_SITE_OTHER): Payer: Medicare Other | Admitting: Neurology

## 2013-05-03 ENCOUNTER — Encounter (INDEPENDENT_AMBULATORY_CARE_PROVIDER_SITE_OTHER): Payer: Self-pay | Admitting: Radiology

## 2013-05-03 DIAGNOSIS — M6281 Muscle weakness (generalized): Secondary | ICD-10-CM

## 2013-05-03 DIAGNOSIS — Z0289 Encounter for other administrative examinations: Secondary | ICD-10-CM

## 2013-05-03 DIAGNOSIS — R29898 Other symptoms and signs involving the musculoskeletal system: Secondary | ICD-10-CM

## 2013-05-03 NOTE — Procedures (Signed)
     HISTORY:  Juan Wilkerson is a 73 year old gentleman with a history of a mechanical valve, on Coumadin. The patient noted sudden onset of clumsiness involving the third, fourth, and fifth fingers of the left hand while trying to play harmonica. The patient reported no pain. The patient indicates that there is clumsiness, and he has to be looking at his hands in order to perform tasks. No true weakness is noted.  NERVE CONDUCTION STUDIES:  Nerve conduction studies were performed on the left upper extremity. The distal motor latencies for the left median and ulnar nerves were normal. The F wave latencies for these nerves were slightly prolonged, but the nerve conduction velocities for these nerves were normal. The sensory latency for the left median nerve was normal, slightly prolonged for the left ulnar and left radial nerves.  EMG STUDIES:  EMG study was performed on the left upper extremity:  The first dorsal interosseous muscle reveals 2 to 4 K units with full recruitment. No fibrillations or positive waves were noted. The abductor pollicis brevis muscle reveals 2 to 4 K units with full recruitment. No fibrillations or positive waves were noted. The extensor indicis proprius muscle reveals 1 to 3 K units with full recruitment. No fibrillations or positive waves were noted. The pronator teres muscle reveals 2 to 3 K units with full recruitment. No fibrillations or positive waves were noted. The biceps muscle reveals 1 to 2 K units with full recruitment. No fibrillations or positive waves were noted. The triceps muscle reveals 2 to 4 K units with full recruitment. No fibrillations or positive waves were noted. The anterior deltoid muscle reveals 2 to 3 K units with full recruitment. No fibrillations or positive waves were noted. The cervical paraspinal muscles were tested at 2 levels. No abnormalities of insertional activity were seen at either level tested. There was good  relaxation.   IMPRESSION:  Nerve conduction studies done on the left upper extremity shows minimal prolongation of the ulnar and radial sensory latencies, with prolongation of the F wave latencies for the left median and ulnar nerves. These changes may be associated with the body habitus, as the patient is quite tall, with long extremities. EMG evaluation of the left upper extremity is unremarkable, without evidence of a neuropathy or evidence of a cervical radiculopathy.  Jill Alexanders MD 05/03/2013 10:51 AM  [facility] [address] [place], [place] [address]  Phone [phone] Fax [phone]

## 2013-05-05 NOTE — Progress Notes (Signed)
Quick Note:  Called patient with results of normal EMG/NCS, left message to remind him to get his MRI completed any questions or concerns to give the office a call back at [phone] ______

## 2013-05-11 ENCOUNTER — Ambulatory Visit
Admission: RE | Admit: 2013-05-11 | Discharge: 2013-05-11 | Disposition: A | Discharge status: Home / Self Care | Payer: Medicare Other | Source: Ambulatory Visit | Attending: Neurology | Admitting: Neurology

## 2013-05-11 DIAGNOSIS — R29898 Other symptoms and signs involving the musculoskeletal system: Secondary | ICD-10-CM

## 2013-05-11 DIAGNOSIS — I6789 Other cerebrovascular disease: Secondary | ICD-10-CM

## 2013-05-24 ENCOUNTER — Ambulatory Visit: Payer: Medicare Other | Attending: Neurology | Admitting: Occupational Therapy

## 2013-05-24 DIAGNOSIS — M6281 Muscle weakness (generalized): Secondary | ICD-10-CM | POA: Insufficient documentation

## 2013-05-24 DIAGNOSIS — IMO0001 Reserved for inherently not codable concepts without codable children: Secondary | ICD-10-CM | POA: Insufficient documentation

## 2013-05-24 DIAGNOSIS — R279 Unspecified lack of coordination: Secondary | ICD-10-CM | POA: Insufficient documentation

## 2013-05-26 ENCOUNTER — Ambulatory Visit: Payer: Medicare Other | Admitting: Occupational Therapy

## 2013-05-30 ENCOUNTER — Encounter (INDEPENDENT_AMBULATORY_CARE_PROVIDER_SITE_OTHER): Payer: Self-pay

## 2013-05-30 ENCOUNTER — Ambulatory Visit (INDEPENDENT_AMBULATORY_CARE_PROVIDER_SITE_OTHER): Payer: Medicare Other | Admitting: Neurology

## 2013-05-30 ENCOUNTER — Ambulatory Visit (INDEPENDENT_AMBULATORY_CARE_PROVIDER_SITE_OTHER): Payer: Medicare Other | Admitting: *Deleted

## 2013-05-30 ENCOUNTER — Encounter: Payer: Self-pay | Admitting: Neurology

## 2013-05-30 VITALS — BP 129/80 | HR 58 | Ht 75.75 in | Wt 203.0 lb

## 2013-05-30 DIAGNOSIS — Z954 Presence of other heart-valve replacement: Secondary | ICD-10-CM

## 2013-05-30 DIAGNOSIS — G248 Other dystonia: Secondary | ICD-10-CM

## 2013-05-30 DIAGNOSIS — G2589 Other specified extrapyramidal and movement disorders: Secondary | ICD-10-CM

## 2013-05-30 DIAGNOSIS — Z7901 Long term (current) use of anticoagulants: Secondary | ICD-10-CM

## 2013-05-30 LAB — POCT INR: INR: 4

## 2013-05-30 NOTE — Progress Notes (Signed)
[facility]    Provider:  Dr Janann Colonel Referring Provider: McGill, Edison Pace, MD Primary Care Physician:  Beverlyn Roux, MD  CC:  LUE weakness  HPI:  Juan Wilkerson is a 73 y.o. male here as a follow up from Dr. Loraine Maple for left hand weakness. Since last visit he has had an unremarkable brain MRI and EMG/NCS. He has also started working with OT and notes some good improvement in his symptoms though they do persist. Continues to have difficulty only when playing an instrument, otherwise feels finger dexterity is good. No other acute concerns at this time.     Initial visit 04/2013: One week ago was playing harmonica and noted acute onset of the 3rd, 4th and 5th digits of the left hand. Had decreased sensation in that hand. Symptoms have slightly improved but continues to have difficulty with fine motor tasks with those fingers. The difficulty is most notable when trying to play an instrument but also notes it with different fine motor tasks, such as buttoning his shirt. Prior to onset denies any abnormal sleeping positions or pressure on the LUE. Notes chronic history of cervical radiculopathy type symptoms and decreased ROM in left shoulder.   Currently taking coumadin for history of mechanical heart valve.   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 + eye itching, frequency of urination, joint pain  History   Social History  . Marital Status: Divorced    Spouse Name: N/A    Number of Children: N/A  . Years of Education: College   Occupational History  .     Social History Main Topics  . Smoking status: Never Smoker   . Smokeless tobacco: Never Used  . Alcohol Use: No  . Drug Use: No  . Sexual Activity: No   Other Topics Concern  . Not on file   Social History Narrative   Divorced, lives  alone. Has a daughter (pt does not have contact with her, has not seen her since she was 35) .    Studied music in college.   Plays oboe with [employer].    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   No smoking. No alcohol use. No recreational drugs.             Family History  Problem Relation Age of Onset  . Cancer Brother   . Hypertension Brother   . Aortic aneurysm Maternal Aunt     Past Medical History  Diagnosis Date  . Marfan's syndrome with aortic dilation   . Hypertension   . Aortic aneurysm 1994    From presumed Marfan's Syndrome.  Followed by Dr Stanford Breed.  Aneurysm involving the proximal descending thoracic aorta .    Marland Kitchen Pulmonary nodules     Found on chest CT 08/2007.  Monitoring with yearly CT.  3.64mm nodule in R iddle lobe.  Marland Kitchen Hyperlipidemia   . Atrial fibrillation     Followed by Dr Stanford Breed.  On coumadin.  . Atrial flutter     Followed by Dr Stanford Breed  . Osteoarthritis   . History of blood transfusion     Past Surgical History  Procedure Laterality Date  . Virtual colonoscopy  11/01/2005    Pt on chronic coumadin for Aortic valve replacement and Atrial fibrilliation therefore at high risk  if stopped.  Need to repeat every 5 yrs.   . Aortic valve replacement  03/1993    St Jude Valve  . Hip fracture surgery  03/1992    s/p R hip Fx  . Vascular surgery    . Cardiac valve replacement    . Kidney surgery    . Coronary artery bypass graft      Current Outpatient Prescriptions  Medication Sig Dispense Refill  . acetaminophen (TYLENOL) 500 MG tablet Take 1,000 mg by mouth 2 (two) times daily as needed. As needed for pain      . amLODipine (NORVASC) 5 MG tablet       . Chlorpheniramine Maleate 12 MG TBCR Take 12 mg by mouth.      . Glucosamine-Chondroit-Vit C-Mn (GLUCOSAMINE-CHONDROITIN) TABS Take 2 tablets by mouth every morning.       . hydrocortisone 1 % cream Apply topically. Apply as needed for itching.      .  metoprolol succinate (TOPROL-XL) 50 MG 24 hr tablet TAKE 1 TABLET BY MOUTH 2 TIMES DAILY. TAKE WITH OR IMMEDIATELY FOLLOWING A MEAL.  180 tablet  3  . Multiple Vitamin (MULTIVITAMIN) capsule Take 1 capsule by mouth every morning.       . pravastatin (PRAVACHOL) 40 MG tablet TAKE 1 TABLET BY MOUTH EVERY DAY  90 tablet  3  . tamsulosin (FLOMAX) 0.4 MG CAPS Take 1 capsule (0.4 mg total) by mouth daily.  30 capsule  0  . warfarin (COUMADIN) 3 MG tablet Take 1-1.5 tablets (3-4.5 mg total) by mouth daily. Take 1 and a half tablets (4.5 mg) on Saturday, On all other days take 1 tablet (3mg )  45 tablet  11   No current facility-administered medications for this visit.    Allergies as of 05/30/2013 - Review Complete 05/30/2013  Allergen Reaction Noted  . Lemon oil Other (See Comments) 04/22/2013    Vitals: BP 129/80  Pulse 58  Ht 6' 3.75" (1.924 m)  Wt 203 lb (92.08 kg)  BMI 24.87 kg/m2 Last Weight:  Wt Readings from Last 1 Encounters:  05/30/13 203 lb (92.08 kg)   Last Height:   Ht Readings from Last 1 Encounters:  05/30/13 6' 3.75" (1.924 m)     Physical exam: Exam: Gen: NAD, conversant Eyes: anicteric sclerae, moist conjunctivae HENT: Atraumatic, oropharynx clear Neck: Trachea midline; supple,  Lungs: CTA, no wheezing, rales, rhonic                          CV: harsh systolic murmur Abdomen: Soft, non-tender;  Extremities: No peripheral edema  Skin: Normal temperature, no rash,  Psych: Appropriate affect, pleasant  Neuro: MS: AA&Ox3, appropriately interactive, normal affect   Speech: fluent w/o paraphasic error  Memory: good recent and remote recall  CN: PERRL, EOMI no nystagmus, no ptosis, sensation intact to LT V1-V3 bilat, face symmetric, no weakness, hearing grossly intact, palate elevates symmetrically, shoulder shrug 5/5 bilat,  tongue protrudes midline, no fasiculations noted.  Motor: mild atrophy left thenar eminence Strength: Minimally decreased ROM and  strength in Left 3rd, 4th, 5th digits (improved from prior visit)  Coord: impaired finger tapping with left hand compared to right  Reflexes: symmetrical, bilat downgoing toes  Sens: LT intact in all extremities  Gait: posture, stance, stride and arm-swing normal. Tandem gait intact. Able to walk on heels and toes. Romberg absent.   Assessment:  After physical and neurologic examination, review of laboratory studies, imaging, neurophysiology  testing and pre-existing records, assessment will be reviewed on the problem list.  Plan:  Treatment plan and additional workup will be reviewed under Problem List.  1)Task specific dystonia  73y/o gentleman presenting for follow up evaluation of L hand weakness and impaired dexterity involving the 3rd, 4th and 5th digits associated with playing an instrument. Has had unremarkable brain MRI and EMG/NCS. Differential would include a focal hand dystonia, specifically a musicians dystonia. Discussed this with the patient who expressed understanding. Discussed therapeutic options such as botulinum toxin therapy, he expressed understanding but wishes to hold off at this point. Will continue with occupational therapy and will follow up once completed.    Jim Like, DO  [facility] [address] [place], [place] [address]  Phone [phone] Fax [phone]

## 2013-06-06 ENCOUNTER — Ambulatory Visit: Payer: Medicare Other | Admitting: Occupational Therapy

## 2013-06-08 ENCOUNTER — Ambulatory Visit: Payer: Medicare Other | Attending: Neurology | Admitting: *Deleted

## 2013-06-08 DIAGNOSIS — R279 Unspecified lack of coordination: Secondary | ICD-10-CM | POA: Insufficient documentation

## 2013-06-08 DIAGNOSIS — IMO0001 Reserved for inherently not codable concepts without codable children: Secondary | ICD-10-CM | POA: Insufficient documentation

## 2013-06-08 DIAGNOSIS — M6281 Muscle weakness (generalized): Secondary | ICD-10-CM | POA: Insufficient documentation

## 2013-06-12 ENCOUNTER — Ambulatory Visit: Payer: Medicare Other

## 2013-06-13 ENCOUNTER — Ambulatory Visit: Payer: Medicare Other

## 2013-06-14 ENCOUNTER — Ambulatory Visit (INDEPENDENT_AMBULATORY_CARE_PROVIDER_SITE_OTHER): Payer: Medicare Other | Admitting: *Deleted

## 2013-06-14 DIAGNOSIS — Z954 Presence of other heart-valve replacement: Secondary | ICD-10-CM

## 2013-06-14 DIAGNOSIS — Z7901 Long term (current) use of anticoagulants: Secondary | ICD-10-CM

## 2013-06-14 LAB — POCT INR: INR: 3.7

## 2013-06-19 ENCOUNTER — Encounter: Payer: Medicare Other | Admitting: Occupational Therapy

## 2013-06-21 ENCOUNTER — Encounter: Payer: Medicare Other | Admitting: Occupational Therapy

## 2013-06-22 ENCOUNTER — Ambulatory Visit (INDEPENDENT_AMBULATORY_CARE_PROVIDER_SITE_OTHER): Payer: Medicare Other | Admitting: *Deleted

## 2013-06-22 DIAGNOSIS — Z7901 Long term (current) use of anticoagulants: Secondary | ICD-10-CM

## 2013-06-22 DIAGNOSIS — Z954 Presence of other heart-valve replacement: Secondary | ICD-10-CM

## 2013-06-22 LAB — POCT INR: INR: 3.6

## 2013-07-06 ENCOUNTER — Ambulatory Visit (INDEPENDENT_AMBULATORY_CARE_PROVIDER_SITE_OTHER): Payer: Medicare Other | Admitting: *Deleted

## 2013-07-06 DIAGNOSIS — Z954 Presence of other heart-valve replacement: Secondary | ICD-10-CM

## 2013-07-06 DIAGNOSIS — Z7901 Long term (current) use of anticoagulants: Secondary | ICD-10-CM

## 2013-07-06 LAB — POCT INR: INR: 2.9

## 2013-07-27 ENCOUNTER — Ambulatory Visit (INDEPENDENT_AMBULATORY_CARE_PROVIDER_SITE_OTHER): Payer: Medicare Other | Admitting: Home Health Services

## 2013-07-27 ENCOUNTER — Encounter: Payer: Self-pay | Admitting: Home Health Services

## 2013-07-27 ENCOUNTER — Ambulatory Visit (INDEPENDENT_AMBULATORY_CARE_PROVIDER_SITE_OTHER): Payer: Medicare Other | Admitting: *Deleted

## 2013-07-27 VITALS — BP 137/83 | HR 62 | Temp 97.2°F | Ht 73.0 in | Wt 204.0 lb

## 2013-07-27 DIAGNOSIS — Z0000 Encounter for general adult medical examination without abnormal findings: Secondary | ICD-10-CM

## 2013-07-27 DIAGNOSIS — Z7901 Long term (current) use of anticoagulants: Secondary | ICD-10-CM

## 2013-07-27 DIAGNOSIS — Z954 Presence of other heart-valve replacement: Secondary | ICD-10-CM

## 2013-07-27 DIAGNOSIS — Z23 Encounter for immunization: Secondary | ICD-10-CM

## 2013-07-27 LAB — POCT INR: INR: 2.7

## 2013-07-27 NOTE — Progress Notes (Signed)
Patient here for annual wellness visit, patient reports: Risk Factors/Conditions needing evaluation or treatment: Pt. Does not have any risk factors that need evaluation.  Home Safety: Pt lives at home, by himself in a 2 story home.  Pt reports having smoke alarms and does not have adaptive equipment.  Other Information: Corrective lens: Pt has reading corrective lens, has annual eye exams. Dentures: Pt does not have dentures, has does not have annual dental exams. Memory: Pt does have memory problems that occurred about a year ago. Has problems remembering names and words.  Patient's Mini Mental Score (recorded in doc. flowsheet): 28 Bladder:  Pt has problems with bladder control and takes medication to control urination. BMI/Exercise: We discussed BMI and strategies for weight loss including the incorporation of fruits and vegetables into diet.  We also discussed  continuing his regular exercise routine of walking.  ADL/IADL:  Pt reports independence in all functions. Balance/Gait: Pt reports 0 falls in the past year.  We discussed home safety and fall prevention.    Balance Abnormal Patient value  Sitting balance    Sit to stand x Hard for him to get up from chair without using support.  Attempts to arise    Immediate standing balance    Standing balance    Nudge    Eyes closed- Romberg  Dizzy when in the dark  Tandem stance x   Back lean x Neck parallel with back  Neck Rotation    360 degree turn    Sitting down x Uses hands on chair arms to sit  Pt appears off balanced when walking, knees facing inward and feet outward.      Annual Wellness Visit Requirements Recorded Today In  Medical, family, social history Past Medical, Family, Social History Section  Current providers Care team  Current medications Medications  Wt, BP, Ht, BMI Vital signs     Hearing assessment (welcome visit) Hearing/vision  Tobacco, alcohol, illicit drug use History  ADL Nurse Assessment   Depression Screening Nurse Assessment  Cognitive impairment Nurse Assessment  Mini Mental Status Document Flowsheet  Fall Risk Fall/Depression  Home Safety Progress Note  End of Life Planning (welcome visit) Social Documentation  Medicare preventative services Progress Note  Risk factors/conditions needing evaluation/treatment Progress Note  Personalized health advice Patient Instructions, goals, letter  Diet & Exercise Social Documentation  Emergency Contact Social Documentation  Seat Belts Social Documentation  Sun exposure/protection Social Documentation   I have reviewed this visit and discussed with Juan Wilkerson and agree with her documentation.  Beverlyn Roux, MD, MPH [facility] PGY-1 07/27/2013 1:45 PM

## 2013-08-24 ENCOUNTER — Ambulatory Visit: Payer: Medicare Other

## 2013-08-28 ENCOUNTER — Ambulatory Visit (INDEPENDENT_AMBULATORY_CARE_PROVIDER_SITE_OTHER): Payer: Medicare Other | Admitting: *Deleted

## 2013-08-28 ENCOUNTER — Ambulatory Visit (INDEPENDENT_AMBULATORY_CARE_PROVIDER_SITE_OTHER): Payer: Medicare Other | Admitting: Family Medicine

## 2013-08-28 ENCOUNTER — Encounter: Payer: Self-pay | Admitting: Family Medicine

## 2013-08-28 VITALS — BP 146/88 | HR 109 | Temp 98.8°F | Ht 75.75 in | Wt 203.0 lb

## 2013-08-28 DIAGNOSIS — I10 Essential (primary) hypertension: Secondary | ICD-10-CM

## 2013-08-28 DIAGNOSIS — Z954 Presence of other heart-valve replacement: Secondary | ICD-10-CM

## 2013-08-28 DIAGNOSIS — F809 Developmental disorder of speech and language, unspecified: Secondary | ICD-10-CM

## 2013-08-28 DIAGNOSIS — R4789 Other speech disturbances: Secondary | ICD-10-CM

## 2013-08-28 DIAGNOSIS — Z1211 Encounter for screening for malignant neoplasm of colon: Secondary | ICD-10-CM

## 2013-08-28 DIAGNOSIS — Z952 Presence of prosthetic heart valve: Secondary | ICD-10-CM

## 2013-08-28 DIAGNOSIS — Z7901 Long term (current) use of anticoagulants: Secondary | ICD-10-CM

## 2013-08-28 DIAGNOSIS — M159 Polyosteoarthritis, unspecified: Secondary | ICD-10-CM

## 2013-08-28 LAB — POCT INR: INR: 3.1

## 2013-08-28 NOTE — Assessment & Plan Note (Signed)
Controlled, followed by cardiology - continue metoprolol and amlodipine

## 2013-08-28 NOTE — Patient Instructions (Addendum)
I am so happy to meet you today. Thank you for coming in to establish care.  I would like to send you for another virtual colonoscopy. Denice Paradise will tell you when this appointment will be.  If anything comes up please call.  Thanks,  Dr. Sherril Cong

## 2013-08-28 NOTE — Assessment & Plan Note (Signed)
Adequately managed with glucosamine/condroitin + tylenol bid - if functional impairment will progress to injections vs. PT, no need today per patient - encouraged physical activity as able

## 2013-08-28 NOTE — Assessment & Plan Note (Signed)
Pt on coumadin for mechanical valve and afib. High risk for colonoscopy. Will obtain virtual colonoscopy (CT). Most recently done in 2012 with a single 46mm polyp at that time. If intervention is needed will need bridging with treatment dose lovenox.

## 2013-08-28 NOTE — Assessment & Plan Note (Signed)
INR check today, has been stable at 3mg  daily

## 2013-08-28 NOTE — Progress Notes (Signed)
   Subjective:    Patient ID: Juan Wilkerson, male    DOB: [birth year], 73 y.o.   MRN: [id number]  HPI Pt presents to establish care with me and follow-up on his chronic medical problems. He is most concerned about his osteoarthritis, primarily of his knees and left shoulder. He takes glucosamine/condroitin and tylenol bid to manage this. The pain is tolerable and he does not feel he needs further intervention at this time.  He has a history of afib and a mechanical valve for which he is anticoagulated on warfarin. He is seen be cardiology for these as well.   In march he had an acute dystonia of his left 3rd, 4th and 5th fingers. He was seen in the ED and later by neurology and diagnosed with "musician's dystonia." He has been doing his stretches and has good flexibility and improving motor control of the 3rd and 4th fingers but the pinky continues to lag in terms of precision which limits his oboe playing. He is optimistic that this will continue to improve with hard work and I offered additional encouragement.  He has frequent episodes of contact dermatitis for which he was previously followed by a dermatologist. He has been taking claritin recently which seems to help in addition to the hydrocortisone which is a long time med for this.  Review of Systems  HENT: Positive for postnasal drip.   Eyes: Positive for discharge and itching.  Gastrointestinal: Negative for blood in stool.  Musculoskeletal: Positive for arthralgias. Negative for joint swelling.  Skin: Positive for rash.  All other systems reviewed and are negative.      Objective:   Physical Exam  Nursing note and vitals reviewed. Constitutional: He is oriented to person, place, and time. He appears well-developed and well-nourished. No distress.  HENT:  Head: Normocephalic and atraumatic.  Eyes: Conjunctivae are normal. Right eye exhibits no discharge. Left eye exhibits no discharge. No scleral icterus.  Neck: Normal range of  motion. Neck supple.  Cardiovascular: Normal rate and intact distal pulses.   No murmur heard. Mechanical valve clicks appreciated. Irregularly irregular Juan.  Pulmonary/Chest: Effort normal and breath sounds normal. No respiratory distress. He has no wheezes.  Abdominal: Soft. Bowel sounds are normal. He exhibits no distension and no mass. There is no tenderness. There is no rebound and no guarding.  Musculoskeletal: Normal range of motion. He exhibits no edema and no tenderness.  Neurological: He is alert and oriented to person, place, and time.  Word-finding difficulty apparent in conversation, occasional stutter.  Skin: Skin is warm and dry. He is not diaphoretic.  Psychiatric: He has a normal mood and affect. His behavior is normal.          Assessment & Plan:

## 2013-08-28 NOTE — Assessment & Plan Note (Signed)
Occasional issues noted on exam. Pt also complaining of difficulty recalling names, seems to do better with remote memory compared to recent.  - obtain MMSE at next visit

## 2013-09-05 ENCOUNTER — Telehealth: Payer: Self-pay | Admitting: *Deleted

## 2013-09-05 NOTE — Telephone Encounter (Signed)
Pt returned all about colonoscopy

## 2013-09-05 NOTE — Telephone Encounter (Signed)
Per [facility] prior-Auth was needed to colonoscopy.after a lengthy conversation with [place] of patient's insurance procedure denied until further review.[id number].this might takes 2 to 3 business days to decide.Left message on patient voicemail to inform and also request a return call to see what he would like to do at this point.Proposito, Lewie Loron

## 2013-09-11 ENCOUNTER — Inpatient Hospital Stay: Admission: RE | Admit: 2013-09-11 | Payer: Medicare Other | Source: Ambulatory Visit

## 2013-09-19 ENCOUNTER — Telehealth: Payer: Self-pay

## 2013-09-19 DIAGNOSIS — N400 Enlarged prostate without lower urinary tract symptoms: Secondary | ICD-10-CM

## 2013-09-19 NOTE — Telephone Encounter (Signed)
Will need to get from PCP, dr Stanford Breed does not refill flomax.

## 2013-09-25 ENCOUNTER — Ambulatory Visit: Payer: Medicare Other

## 2013-09-27 ENCOUNTER — Ambulatory Visit (INDEPENDENT_AMBULATORY_CARE_PROVIDER_SITE_OTHER): Payer: Medicare Other | Admitting: *Deleted

## 2013-09-27 DIAGNOSIS — Z7901 Long term (current) use of anticoagulants: Secondary | ICD-10-CM

## 2013-09-27 DIAGNOSIS — Z954 Presence of other heart-valve replacement: Secondary | ICD-10-CM

## 2013-09-27 LAB — POCT INR: INR: 3.6

## 2013-10-25 ENCOUNTER — Ambulatory Visit (INDEPENDENT_AMBULATORY_CARE_PROVIDER_SITE_OTHER): Payer: Medicare Other | Admitting: *Deleted

## 2013-10-25 DIAGNOSIS — Z7901 Long term (current) use of anticoagulants: Secondary | ICD-10-CM

## 2013-10-25 DIAGNOSIS — Z954 Presence of other heart-valve replacement: Secondary | ICD-10-CM

## 2013-10-25 LAB — POCT INR: INR: 3.8

## 2013-11-08 ENCOUNTER — Ambulatory Visit (INDEPENDENT_AMBULATORY_CARE_PROVIDER_SITE_OTHER): Payer: Medicare Other | Admitting: *Deleted

## 2013-11-08 DIAGNOSIS — Z7901 Long term (current) use of anticoagulants: Secondary | ICD-10-CM

## 2013-11-08 DIAGNOSIS — Z954 Presence of other heart-valve replacement: Secondary | ICD-10-CM

## 2013-11-08 LAB — POCT INR: INR: 3.8

## 2013-11-15 ENCOUNTER — Other Ambulatory Visit: Payer: Self-pay

## 2013-11-15 MED ORDER — AMLODIPINE BESYLATE 5 MG PO TABS: 5.0000 mg | ORAL_TABLET | Freq: Every day | ORAL | Status: DC

## 2013-11-22 ENCOUNTER — Ambulatory Visit (INDEPENDENT_AMBULATORY_CARE_PROVIDER_SITE_OTHER): Payer: Medicare Other | Admitting: *Deleted

## 2013-11-22 DIAGNOSIS — Z954 Presence of other heart-valve replacement: Secondary | ICD-10-CM

## 2013-11-22 DIAGNOSIS — Z23 Encounter for immunization: Secondary | ICD-10-CM

## 2013-11-22 DIAGNOSIS — Z7901 Long term (current) use of anticoagulants: Secondary | ICD-10-CM

## 2013-11-22 LAB — POCT INR: INR: 1.9

## 2013-11-30 ENCOUNTER — Ambulatory Visit (INDEPENDENT_AMBULATORY_CARE_PROVIDER_SITE_OTHER): Payer: Medicare Other | Admitting: *Deleted

## 2013-11-30 DIAGNOSIS — Z7901 Long term (current) use of anticoagulants: Secondary | ICD-10-CM

## 2013-11-30 DIAGNOSIS — Z954 Presence of other heart-valve replacement: Secondary | ICD-10-CM

## 2013-11-30 LAB — POCT INR: INR: 2.6

## 2013-12-15 ENCOUNTER — Ambulatory Visit (INDEPENDENT_AMBULATORY_CARE_PROVIDER_SITE_OTHER): Payer: Medicare Other | Admitting: *Deleted

## 2013-12-15 ENCOUNTER — Encounter: Payer: Self-pay | Admitting: Family Medicine

## 2013-12-15 ENCOUNTER — Ambulatory Visit (INDEPENDENT_AMBULATORY_CARE_PROVIDER_SITE_OTHER): Payer: Medicare Other | Admitting: Family Medicine

## 2013-12-15 VITALS — BP 151/111 | HR 98 | Temp 98.5°F | Ht 76.0 in | Wt 206.0 lb

## 2013-12-15 DIAGNOSIS — Z7901 Long term (current) use of anticoagulants: Secondary | ICD-10-CM

## 2013-12-15 DIAGNOSIS — M1712 Unilateral primary osteoarthritis, left knee: Secondary | ICD-10-CM

## 2013-12-15 DIAGNOSIS — Z952 Presence of prosthetic heart valve: Secondary | ICD-10-CM

## 2013-12-15 DIAGNOSIS — Z954 Presence of other heart-valve replacement: Secondary | ICD-10-CM

## 2013-12-15 DIAGNOSIS — M129 Arthropathy, unspecified: Secondary | ICD-10-CM

## 2013-12-15 LAB — POCT INR: INR: 2.6

## 2013-12-15 NOTE — Patient Instructions (Signed)
For your knee pain, we drained some of the extra fluid and injected a steroid to decrease the inflammation. Your pain should start getting better in the next few days.  You can continue using the tylenol as you have been and the brace when you are putting extra strain on the joint.

## 2013-12-20 ENCOUNTER — Other Ambulatory Visit: Payer: Self-pay | Admitting: *Deleted

## 2013-12-20 MED ORDER — PRAVASTATIN SODIUM 40 MG PO TABS: ORAL_TABLET | ORAL | Status: DC

## 2014-01-01 NOTE — Assessment & Plan Note (Addendum)
Mild effusion present, worse pain for 1 week - joint aspiration and corticosteroid injection performed today. 1:4 depomedrol and lidocaine. - continue compression brace and prn tylenol - ice - no nsaids given pt on warfarin w/ h/o gib

## 2014-01-01 NOTE — Progress Notes (Signed)
   Subjective:    Patient ID: Juan Wilkerson, male    DOB: [birth year], 73 y.o.   MRN: [id number]  Knee Pain    Pt presents for knee pain for which he has been seen on several occasions. Pt reports he stood up recently and felt a twinge in his left knee. Since then it has been very sore. He has been using a brace and tylenol, both of which help some but is still having a lot of pain. He has noticed swelling in the joint but no catching or giving way. It does not feel unstable. He requests drainage and steroid injection to help with pain.   Review of Systems     Objective:   Physical Exam  Nursing note and vitals reviewed. Constitutional: He is oriented to person, place, and time. He appears well-developed and well-nourished. No distress.  HENT:  Head: Normocephalic and atraumatic.  Eyes: Conjunctivae are normal. Right eye exhibits no discharge. Left eye exhibits no discharge. No scleral icterus.  Cardiovascular: Normal rate.   Pulmonary/Chest: Effort normal.  Abdominal: He exhibits no distension.  Musculoskeletal:       Right knee: Normal.       Left knee: He exhibits effusion. He exhibits normal range of motion, no ecchymosis, no deformity, no laceration, no erythema, normal alignment, no LCL laxity, normal patellar mobility, no bony tenderness, normal meniscus and no MCL laxity. Tenderness found. Medial joint line and lateral joint line tenderness noted. No patellar tendon tenderness noted.  Neurological: He is alert and oriented to person, place, and time.  Skin: Skin is warm and dry. No rash noted. He is not diaphoretic.  Psychiatric: He has a normal mood and affect. His behavior is normal.          Assessment & Plan:

## 2014-01-02 NOTE — Progress Notes (Signed)
HPI: FU atrial fibrillation/flutter and St Jude mechanical aortic valve replacement. History of Marfan syndrome, status post St. Jude AVR and aortic reconstruction as well as resection of an aortic aneurysm in 1995, permanent atrial fibrillation/flutter, chronic Coumadin therapy. Echo 11/14 showed normal LV function, moderate LVH, mechanical AVR with trace AI, biatrial enlargement, mild RVE and mild MR; mean gradient across AVR 10 to 19 mmHg. 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 Since last seen, He has minimal dyspnea. No chest pain, palpitations, syncope or bleeding.   Current Outpatient Prescriptions  Medication Sig Dispense Refill  . acetaminophen (TYLENOL) 500 MG tablet Take 1,000 mg by mouth 2 (two) times daily as needed. As needed for pain      . amLODipine (NORVASC) 5 MG tablet Take 1 tablet (5 mg total) by mouth daily.  90 tablet  0  . Glucosamine-Chondroit-Vit C-Mn (GLUCOSAMINE-CHONDROITIN) TABS Take 2 tablets by mouth every morning.       . hydrocortisone 1 % cream Apply topically. Apply as needed for itching.      . loratadine (CLARITIN) 10 MG tablet Take 10 mg by mouth daily.      . metoprolol succinate (TOPROL-XL) 50 MG 24 hr tablet TAKE 1 TABLET BY MOUTH 2 TIMES DAILY. TAKE WITH OR IMMEDIATELY FOLLOWING A MEAL.  180 tablet  3  . Multiple Vitamin (MULTIVITAMIN) capsule Take 1 capsule by mouth every morning.       . pravastatin (PRAVACHOL) 40 MG tablet TAKE 1 TABLET BY MOUTH EVERY DAY  90 tablet  3  . tamsulosin (FLOMAX) 0.4 MG CAPS Take 1 capsule (0.4 mg total) by mouth daily.  30 capsule  0  . warfarin (COUMADIN) 3 MG tablet Take 1-1.5 tablets (3-4.5 mg total) by mouth daily. Take 1 and a half tablets (4.5 mg) on Saturday, On all other days take 1 tablet (3mg )  45 tablet  11   No current facility-administered medications for this visit.     Past Medical History  Diagnosis Date  . Marfan's syndrome with aortic  dilation   . Hypertension   . Aortic aneurysm 1994    From presumed Marfan's Syndrome.  Followed by Dr Stanford Breed.  Aneurysm involving the proximal descending thoracic aorta .    Marland Kitchen Pulmonary nodules     Found on chest CT 08/2007.  Monitoring with yearly CT.  3.54mm nodule in R iddle lobe.  Marland Kitchen Hyperlipidemia   . Atrial fibrillation     Followed by Dr Stanford Breed.  On coumadin.  . Atrial flutter     Followed by Dr Stanford Breed  . Osteoarthritis   . History of blood transfusion     Past Surgical History  Procedure Laterality Date  . Virtual colonoscopy  11/01/2005    Pt on chronic coumadin for Aortic valve replacement and Atrial fibrilliation therefore at high risk if stopped.  Need to repeat every 5 yrs.   . Aortic valve replacement  03/1993    St Jude Valve  . Hip fracture surgery  03/1992    s/p R hip Fx  . Vascular surgery    . Cardiac valve replacement    . Kidney surgery    . Coronary artery bypass graft      History   Social History  . Marital Status: Divorced    Spouse Name: N/A    Number of Children: 0  . Years of Education: 47   Occupational History  .  [employer]   . 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 Smoking status: Never Smoker   . Smokeless tobacco: Never Used  . Alcohol Use: No  . Drug Use: No  . Sexual Activity: No   Other Topics Concern  . Not on file   Social History Narrative   Divorced, lives alone. Has a daughter (pt does not have contact with her, has not seen her since she was 93).    Studied music in college.   Plays oboe with [employer] [place].    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   No smoking. No alcohol use. No recreational drugs.      Health Care POA:    Emergency Contact: Gerald Stabs [phone]   End of Life Plan: Gave pt the ad pamplet   Who lives with you: Lives by himself   Any pets: 0   Diet: pt diet varies   Exercise: walks and going up and down stairs most days    Seatbelts: pt uses seat belt when in his car   Sun Exposure/Protection: pt uses hats and long sleeves   Hobbies: harmonica                   ROS: no fevers or chills, productive cough, hemoptysis, dysphasia, odynophagia, melena, hematochezia, dysuria, hematuria, rash, seizure activity, orthopnea, PND, pedal edema, claudication. Remaining systems are negative.  Physical Exam: Well-developed well-nourished in no acute distress.  Skin is warm and dry.  HEENT is normal.  Neck is supple.  Chest is clear to auscultation with normal expansion.  Cardiovascular exam is irregular Abdominal exam nontender or distended. No masses palpated. Extremities show no edema. neuro grossly intact  ECG Atrial flutter, left anterior vesicular block, right bundle branch block

## 2014-01-03 ENCOUNTER — Ambulatory Visit (INDEPENDENT_AMBULATORY_CARE_PROVIDER_SITE_OTHER): Payer: Medicare Other | Admitting: Cardiology

## 2014-01-03 ENCOUNTER — Encounter: Payer: Self-pay | Admitting: Cardiology

## 2014-01-03 VITALS — BP 140/80 | HR 90 | Ht 75.0 in | Wt 205.2 lb

## 2014-01-03 DIAGNOSIS — I723 Aneurysm of iliac artery: Secondary | ICD-10-CM

## 2014-01-03 DIAGNOSIS — I480 Paroxysmal atrial fibrillation: Secondary | ICD-10-CM

## 2014-01-03 DIAGNOSIS — I712 Thoracic aortic aneurysm, without rupture, unspecified: Secondary | ICD-10-CM

## 2014-01-03 DIAGNOSIS — E7800 Pure hypercholesterolemia, unspecified: Secondary | ICD-10-CM

## 2014-01-03 DIAGNOSIS — I10 Essential (primary) hypertension: Secondary | ICD-10-CM

## 2014-01-03 DIAGNOSIS — I4892 Unspecified atrial flutter: Secondary | ICD-10-CM

## 2014-01-03 LAB — BASIC METABOLIC PANEL WITH GFR
BUN: 17 mg/dL (ref 6–23)
CHLORIDE: 104 meq/L (ref 96–112)
CO2: 29 mEq/L (ref 19–32)
Calcium: 9.4 mg/dL (ref 8.4–10.5)
Creat: 0.79 mg/dL (ref 0.50–1.35)
GFR, EST NON AFRICAN AMERICAN: 88 mL/min
GFR, Est African American: >89 mL/min
Glucose, Bld: 93 mg/dL (ref 70–99)
POTASSIUM: 4.6 meq/L (ref 3.5–5.3)
Sodium: 141 mEq/L (ref 135–145)

## 2014-01-03 LAB — HEPATIC FUNCTION PANEL
ALBUMIN: 4.1 g/dL (ref 3.5–5.2)
ALT: 13 U/L (ref 0–53)
AST: 24 U/L (ref 0–37)
Alkaline Phosphatase: 87 U/L (ref 39–117)
BILIRUBIN DIRECT: 0.2 mg/dL (ref 0.0–0.3)
Indirect Bilirubin: 0.7 mg/dL (ref 0.2–1.2)
Total Bilirubin: 0.9 mg/dL (ref 0.2–1.2)
Total Protein: 7 g/dL (ref 6.0–8.3)

## 2014-01-03 LAB — CBC
HCT: 45.6 % (ref 39.0–52.0)
Hemoglobin: 15.5 g/dL (ref 13.0–17.0)
MCH: 30.5 pg (ref 26.0–34.0)
MCHC: 34 g/dL (ref 30.0–36.0)
MCV: 89.8 fL (ref 78.0–100.0)
PLATELETS: 137 10*3/uL — AB (ref 150–400)
RBC: 5.08 MIL/uL (ref 4.22–5.81)
RDW: 14.3 % (ref 11.5–15.5)
WBC: 6.8 10*3/uL (ref 4.0–10.5)

## 2014-01-03 LAB — LIPID PANEL
CHOL/HDL RATIO: 3.7 ratio
CHOLESTEROL: 174 mg/dL (ref 0–200)
HDL: 47 mg/dL (ref >39)
LDL Cholesterol: 92 mg/dL (ref 0–99)
Triglycerides: 174 mg/dL — ABNORMAL HIGH (ref <150)
VLDL: 35 mg/dL (ref 0–40)

## 2014-01-03 NOTE — Assessment & Plan Note (Signed)
Continue SBE prophylaxis. 

## 2014-01-03 NOTE — Patient Instructions (Signed)
Your physician wants you to follow-up in: [place] will receive a reminder letter in the mail two months in advance. If you don't receive a letter, please call our office to schedule the follow-up appointment.   Your physician recommends that you HAVE LAB WORK TODAY  CTA OF THE CHEST TO FOLLOW UP THORACIC ANEURYSM  CTA OF THE ABDOMIN TO FOLLOW UP ILIAC ANEURYSM

## 2014-01-03 NOTE — Assessment & Plan Note (Signed)
Blood pressure controlled. Continue present medications. 

## 2014-01-03 NOTE — Assessment & Plan Note (Signed)
Repeat CTA of thoracic aorta and abdominal aorta.

## 2014-01-03 NOTE — Assessment & Plan Note (Signed)
Patient remains in atrial flutter. Continue Coumadin. Check hemoglobin. Continue beta blocker for rate control.

## 2014-01-03 NOTE — Assessment & Plan Note (Signed)
Continue statin. Check lipids and liver. 

## 2014-01-05 ENCOUNTER — Encounter: Payer: Self-pay | Admitting: *Deleted

## 2014-01-05 ENCOUNTER — Ambulatory Visit (INDEPENDENT_AMBULATORY_CARE_PROVIDER_SITE_OTHER): Payer: Medicare Other | Admitting: *Deleted

## 2014-01-05 DIAGNOSIS — Z7901 Long term (current) use of anticoagulants: Secondary | ICD-10-CM

## 2014-01-05 DIAGNOSIS — Z952 Presence of prosthetic heart valve: Secondary | ICD-10-CM

## 2014-01-05 DIAGNOSIS — Z954 Presence of other heart-valve replacement: Secondary | ICD-10-CM

## 2014-01-05 LAB — POCT INR: INR: 4.2

## 2014-01-08 ENCOUNTER — Other Ambulatory Visit: Payer: Self-pay | Admitting: *Deleted

## 2014-01-08 DIAGNOSIS — I723 Aneurysm of iliac artery: Secondary | ICD-10-CM

## 2014-01-10 ENCOUNTER — Ambulatory Visit
Admission: RE | Admit: 2014-01-10 | Discharge: 2014-01-10 | Disposition: A | Discharge status: Home / Self Care | Payer: Medicare Other | Source: Ambulatory Visit | Attending: Cardiology | Admitting: Cardiology

## 2014-01-10 ENCOUNTER — Other Ambulatory Visit: Payer: Medicare Other

## 2014-01-10 DIAGNOSIS — I712 Thoracic aortic aneurysm, without rupture, unspecified: Secondary | ICD-10-CM

## 2014-01-10 DIAGNOSIS — I723 Aneurysm of iliac artery: Secondary | ICD-10-CM

## 2014-01-10 MED ORDER — IOHEXOL 350 MG/ML SOLN
80.0000 mL | Freq: Once | INTRAVENOUS | Status: AC | PRN
  Administered 2014-01-10: 80 mL via INTRAVENOUS

## 2014-01-17 ENCOUNTER — Ambulatory Visit (INDEPENDENT_AMBULATORY_CARE_PROVIDER_SITE_OTHER): Payer: Medicare Other | Admitting: *Deleted

## 2014-01-17 DIAGNOSIS — Z952 Presence of prosthetic heart valve: Secondary | ICD-10-CM

## 2014-01-17 DIAGNOSIS — Z954 Presence of other heart-valve replacement: Secondary | ICD-10-CM

## 2014-01-17 DIAGNOSIS — Z7901 Long term (current) use of anticoagulants: Secondary | ICD-10-CM

## 2014-01-17 LAB — POCT INR: INR: 3.1

## 2014-01-24 ENCOUNTER — Other Ambulatory Visit (HOSPITAL_COMMUNITY): Payer: Self-pay | Admitting: Interventional Radiology

## 2014-01-24 DIAGNOSIS — N2889 Other specified disorders of kidney and ureter: Secondary | ICD-10-CM

## 2014-02-06 ENCOUNTER — Ambulatory Visit (INDEPENDENT_AMBULATORY_CARE_PROVIDER_SITE_OTHER): Payer: Medicare Other | Admitting: *Deleted

## 2014-02-06 DIAGNOSIS — Z7901 Long term (current) use of anticoagulants: Secondary | ICD-10-CM

## 2014-02-06 DIAGNOSIS — Z954 Presence of other heart-valve replacement: Secondary | ICD-10-CM

## 2014-02-06 DIAGNOSIS — Z952 Presence of prosthetic heart valve: Secondary | ICD-10-CM

## 2014-02-06 LAB — POCT INR: INR: 2.1

## 2014-02-07 ENCOUNTER — Ambulatory Visit
Admission: RE | Admit: 2014-02-07 | Discharge: 2014-02-07 | Disposition: A | Discharge status: Home / Self Care | Payer: Medicare Other | Source: Ambulatory Visit | Attending: Interventional Radiology | Admitting: Interventional Radiology

## 2014-02-07 DIAGNOSIS — N2889 Other specified disorders of kidney and ureter: Secondary | ICD-10-CM

## 2014-02-07 NOTE — Progress Notes (Signed)
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  History of Present Illness: Juan Wilkerson is a 73 y.o. 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 He has been doing well with no complaints following the procedure. His only complaint today is some worsening of chronic bilateral knee pain, especially when standing from prolonged sitting. The patient has been previously evaluated by Dr. Oneida Alar for this problem.  Past Medical History  Diagnosis Date  . Marfan's syndrome with aortic dilation   . Hypertension   . Aortic aneurysm 1994    From presumed Marfan's Syndrome.  Followed by Dr Stanford Breed.  Aneurysm involving the proximal descending thoracic aorta .    Marland Kitchen Pulmonary nodules     Found on chest CT 08/2007.  Monitoring with yearly CT.  3.28mm nodule in R iddle lobe.  Marland Kitchen Hyperlipidemia   . Atrial fibrillation     Followed by Dr Stanford Breed.  On coumadin.  . Atrial flutter     Followed by Dr Stanford Breed  . Osteoarthritis   . History of blood transfusion     Past Surgical History  Procedure Laterality Date  . Virtual colonoscopy  11/01/2005    Pt on chronic coumadin for Aortic valve replacement and Atrial fibrilliation therefore at high risk if stopped.  Need to repeat every 5 yrs.   . Aortic valve replacement  03/1993    St Jude Valve  . Hip fracture surgery  03/1992    s/p R hip Fx  . Vascular surgery    . Cardiac valve replacement    . Kidney surgery    . Coronary artery bypass graft      Allergies: Lemon oil  Medications: Prior to Admission medications   Medication Sig Start Date End Date Taking? Authorizing Provider  acetaminophen (TYLENOL) 500 MG tablet Take 1,000 mg by mouth 2 (two) times daily as needed. As needed for pain    Historical Provider, MD  amLODipine (NORVASC) 5 MG tablet Take 1 tablet (5 mg total) by mouth daily. 11/15/13   Lelon Perla, MD    Glucosamine-Chondroit-Vit C-Mn (GLUCOSAMINE-CHONDROITIN) TABS Take 2 tablets by mouth every morning.     Historical Provider, MD  hydrocortisone 1 % cream Apply topically. Apply as needed for itching.    Historical Provider, MD  loratadine (CLARITIN) 10 MG tablet Take 10 mg by mouth daily.    Historical Provider, MD  metoprolol succinate (TOPROL-XL) 50 MG 24 hr tablet TAKE 1 TABLET BY MOUTH 2 TIMES DAILY. TAKE WITH OR IMMEDIATELY FOLLOWING A MEAL. 01/09/13   Lelon Perla, MD  Multiple Vitamin (MULTIVITAMIN) capsule Take 1 capsule by mouth every morning.     Historical Provider, MD  pravastatin (PRAVACHOL) 40 MG tablet TAKE 1 TABLET BY MOUTH EVERY DAY 12/20/13   Lelon Perla, MD  tamsulosin (FLOMAX) 0.4 MG CAPS Take 1 capsule (0.4 mg total) by mouth daily. 09/29/12   Frazier Richards, MD  warfarin (COUMADIN) 3 MG tablet Take 1-1.5 tablets (3-4.5 mg total) by mouth daily. Take 1 and a half tablets (4.5 mg) on Saturday, On all other days take 1 tablet (3mg ) 04/28/13   Frazier Richards, MD    Family History  Problem Relation Age of Onset  . Cancer Brother   . Hypertension Brother   . Aortic aneurysm Maternal Aunt     History   Social History  . Marital Status: Divorced  Spouse Name: N/A    Number of Children: 0  . Years of Education: 16   Occupational History  . [employer]   . 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 Smoking status: Never Smoker   . Smokeless tobacco: Never Used  . Alcohol Use: No  . Drug Use: No  . Sexual Activity: No   Other Topics Concern  . Not on file   Social History Narrative   Divorced, lives alone. Has a daughter (pt does not have contact with her, has not seen her since she was 110).    Studied music in college.   Plays oboe with [employer] [place].    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   No smoking. No alcohol use. No recreational drugs.      Health Care POA:    Emergency Contact: Gerald Stabs [phone]   End of Life Plan: Gave pt the ad pamplet   Who lives with you: Lives by himself   Any pets: 0   Diet: pt diet varies   Exercise: walks and going up and down stairs most days   Seatbelts: pt uses seat belt when in his car   Sun Exposure/Protection: pt uses hats and long sleeves   Hobbies: harmonica                   Review of Systems: A 12 point ROS discussed and pertinent positives are indicated in the HPI above.  All other systems are negative.  Review of Systems  Constitutional: Negative.   HENT: Negative.   Eyes: Negative.   Respiratory: Negative.   Cardiovascular: Negative.   Gastrointestinal: Negative.   Endocrine: Negative.   Genitourinary: Negative.   Musculoskeletal: Positive for arthralgias. Negative for myalgias and back pain.  Neurological: Negative.   Hematological: Negative.   Bilateral knee pain.  Vital Signs: BP 144/83 mmHg  Pulse 65  Temp(Src) 97.6 F (36.4 C) (Oral)  Resp 14  Ht 6\' 4"  (1.93 m)  Wt 207 lb (93.895 kg)  BMI 25.21 kg/m2  SpO2 98%  Physical Exam  Constitutional: He is oriented to person, place, and time. He appears well-developed and well-nourished. No distress.  Pulmonary/Chest: Effort normal.  Abdominal: Soft. He exhibits no distension. There is no tenderness.  Neurological: He is alert and oriented to person, place, and time.  Skin: He is not diaphoretic.    Imaging: Ct Angio Chest Aorta W/cm &/or Wo/cm  01/10/2014   CLINICAL DATA:  History of Marfan's syndrome and status post prior replacement of aortic root and aortic valve. Status post prior left renal cryoablation of oncocytoma. 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  EXAM: CT ANGIOGRAPHY CHEST, ABDOMEN AND PELVIS  TECHNIQUE: Multidetector CT imaging through the chest, abdomen and pelvis was performed using the standard protocol during bolus administration of  intravenous contrast. Multiplanar reconstructed images and MIPs were obtained and reviewed to evaluate the vascular anatomy.  CONTRAST:  3mL OMNIPAQUE IOHEXOL 350 MG/ML SOLN  COMPARISON:  01/12/2013  FINDINGS: CTA CHEST FINDINGS  Aortic valve and root graft appears stable without evidence of anastomotic pseudoaneurysm or dissection. The proximal aortic arch measures 4 cm in diameter. The distal arch measures 4.3 cm. Proximal descending thoracic aorta measures 3.7 cm. Mid descending thoracic aorta measures 3.4 cm. Distal descending thoracic aorta measures 3.1 cm. Thoracic aorta dimensions are stable without  evidence of progressive aneurysmal disease.  Proximal great vessels show normal patency. There is mild aneurysmal disease of the innominate artery which measures 15 mm in greatest diameter.  The heart size is stable and within normal limits. No pleural or pericardial fluid is identified. Lungs show no evidence of edema, infiltrates or nodules. Visualized airways are normally patent. There is some mild scarring in the right middle lobe with stable small nodular areas identified in the right middle lobe. No enlarged lymph nodes are seen. Stable calcified lymph node in the AP window.  Review of the MIP images confirms the above findings.  CTA ABDOMEN AND PELVIS FINDINGS  The abdominal aorta measures 3.2 x 3.4 cm at the diaphragmatic hiatus. There is stable fusiform dilatation of the celiac axis trunk measuring up to 15 mm in diameter. The superior mesenteric artery and bilateral single renal arteries show stable and normal patency. The inferior mesenteric artery is normally patent. There is stable tortuosity of the infrarenal abdominal aorta. Just below the renal arteries the abdominal aorta measures 2.7 x 2.8 cm.  Stable tortuosity and fusiform aneurysmal disease of the common iliac arteries. The distal left common iliac artery reaches maximum diameter of 2.2 cm. The right common iliac artery also reaches maximal  diameter of 2.2 cm. External iliac arteries show normal patency. There is mild aneurysmal dilatation of the trunk of the left internal iliac artery measuring 1.4 cm. Bilateral common femoral arteries and femoral bifurcation show normal patency.  Nonvascular evaluation demonstrates stable hepatic cysts. Left posterior renal cryoablation defect shows further retraction and decrease in size with scar tissue now measuring approximately 1.4 x 1.5 cm compared to 1.6 x 2.2 cm on the prior exam. On the arterial phase of enhancement, there is no evidence of abnormal enhancement at the level of cryoablation. The posterior left perinephric hematoma is now essentially resolved with a minimal amount of residual soft tissue thickening remaining.  The gallbladder, pancreas, spleen, adrenal glands and bowel are unremarkable. No enlarged lymph nodes are seen. No hernias are identified. Prostatic enlargement present. Spondylosis noted of the lumbar spine.  Review of the MIP images confirms the above findings.  IMPRESSION: 1. Stable aneurysmal disease of the thoracic aorta status post replacement of the aortic valve and root. Aneurysmal disease of the distal arch measures 4.3 cm in diameter and is stable. No evidence of aortic aneurysm or complications at the site of previous aortic root replacement. 2. Stable mild dilatation of the supraceliac abdominal aorta. 3. 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 Aneurysmal dilatation of the left proximal internal iliac artery measures 1.4 cm. 4. Further retraction and decrease in size of scar tissue status post prior left renal cryoablation. No evidence of abnormal enhancement at the site of ablation. 5. Essentially resolved prior left perinephric hematoma with minimal amount of residual soft tissue thickening remaining.   Electronically Signed   By: Aletta Edouard M.D.   On: 01/10/2014 16:40   Ct Angio Abd/pel W/  And/or W/o  01/10/2014   CLINICAL DATA:  History of Marfan's syndrome and status post prior replacement of aortic root and aortic valve. Status post prior left renal cryoablation of oncocytoma. 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  EXAM: CT ANGIOGRAPHY CHEST, ABDOMEN AND PELVIS  TECHNIQUE: Multidetector CT imaging through the chest, abdomen and pelvis was performed using the standard protocol during bolus administration of  intravenous contrast. Multiplanar reconstructed images and MIPs were obtained and reviewed to evaluate the vascular anatomy.  CONTRAST:  58mL OMNIPAQUE IOHEXOL 350 MG/ML SOLN  COMPARISON:  01/12/2013  FINDINGS: CTA CHEST FINDINGS  Aortic valve and root graft appears stable without evidence of anastomotic pseudoaneurysm or dissection. The proximal aortic arch measures 4 cm in diameter. The distal arch measures 4.3 cm. Proximal descending thoracic aorta measures 3.7 cm. Mid descending thoracic aorta measures 3.4 cm. Distal descending thoracic aorta measures 3.1 cm. Thoracic aorta dimensions are stable without evidence of progressive aneurysmal disease.  Proximal great vessels show normal patency. There is mild aneurysmal disease of the innominate artery which measures 15 mm in greatest diameter.  The heart size is stable and within normal limits. No pleural or pericardial fluid is identified. Lungs show no evidence of edema, infiltrates or nodules. Visualized airways are normally patent. There is some mild scarring in the right middle lobe with stable small nodular areas identified in the right middle lobe. No enlarged lymph nodes are seen. Stable calcified lymph node in the AP window.  Review of the MIP images confirms the above findings.  CTA ABDOMEN AND PELVIS FINDINGS  The abdominal aorta measures 3.2 x 3.4 cm at the diaphragmatic hiatus. There is stable fusiform dilatation of the celiac axis  trunk measuring up to 15 mm in diameter. The superior mesenteric artery and bilateral single renal arteries show stable and normal patency. The inferior mesenteric artery is normally patent. There is stable tortuosity of the infrarenal abdominal aorta. Just below the renal arteries the abdominal aorta measures 2.7 x 2.8 cm.  Stable tortuosity and fusiform aneurysmal disease of the common iliac arteries. The distal left common iliac artery reaches maximum diameter of 2.2 cm. The right common iliac artery also reaches maximal diameter of 2.2 cm. External iliac arteries show normal patency. There is mild aneurysmal dilatation of the trunk of the left internal iliac artery measuring 1.4 cm. Bilateral common femoral arteries and femoral bifurcation show normal patency.  Nonvascular evaluation demonstrates stable hepatic cysts. Left posterior renal cryoablation defect shows further retraction and decrease in size with scar tissue now measuring approximately 1.4 x 1.5 cm compared to 1.6 x 2.2 cm on the prior exam. On the arterial phase of enhancement, there is no evidence of abnormal enhancement at the level of cryoablation. The posterior left perinephric hematoma is now essentially resolved with a minimal amount of residual soft tissue thickening remaining.  The gallbladder, pancreas, spleen, adrenal glands and bowel are unremarkable. No enlarged lymph nodes are seen. No hernias are identified. Prostatic enlargement present. Spondylosis noted of the lumbar spine.  Review of the MIP images confirms the above findings.  IMPRESSION: 1. Stable aneurysmal disease of the thoracic aorta status post replacement of the aortic valve and root. Aneurysmal disease of the distal arch measures 4.3 cm in diameter and is stable. No evidence of aortic aneurysm or complications at the site of previous aortic root replacement. 2. Stable mild dilatation of the supraceliac abdominal aorta. 3. Stable fusiform aneurysmal disease of bilateral  common iliac arteries reaching maximal caliber of 2.2 cm bilaterally. Aneurysmal dilatation of the left proximal internal iliac artery measures 1.4 cm. 4. Further retraction and decrease in size of scar tissue status post prior left renal cryoablation. No evidence of abnormal enhancement at the site of ablation. 5. Essentially resolved prior left perinephric hematoma with minimal amount of residual soft tissue thickening remaining.   Electronically Signed   By: Jenness Corner.D.  On: 01/10/2014 16:40    Labs:  CBC:  Recent Labs  01/03/14 0955  WBC 6.8  HGB 15.5  HCT 45.6  PLT 137*    COAGS:  Recent Labs  12/15/13 0956 01/05/14 1006 01/17/14 1000 02/06/14 1035  INR 2.6 4.2 3.1 2.1    BMP:  Recent Labs  01/03/14 0955  NA 141  K 4.6  CL 104  CO2 29  GLUCOSE 93  BUN 17  CALCIUM 9.4  CREATININE 0.79  GFRNONAA 88  GFRAA >89    LIVER FUNCTION TESTS:  Recent Labs  01/03/14 0955  BILITOT 0.9  AST 24  ALT 13  ALKPHOS 87  PROT 7.0  ALBUMIN 4.1    TUMOR MARKERS: No results for input(s): AFPTM, CEA, CA199, CHROMGRNA in the last 8760 hours.  Assessment and Plan:  Recent imaging demonstrates no evidence of recurrent left renal oncocytic neoplasm after cryoablation. The patient is now 2 years post ablation. He is doing well. Renal function is stable and normal. The left perinephric hematoma that occurred immediately after ablation has now resolved. There is stable aneurysmal disease of the thoracic aorta and bilateral common iliac arteries.  I recommended CT imaging follow-up in 1 year.  I spent a total of 15 minutes face to face in clinical consultation, greater than 50% of which was counseling/coordinating care post left renal ablation.  SignedAletta Edouard T 02/07/2014, 1:09 PM

## 2014-02-14 ENCOUNTER — Other Ambulatory Visit: Payer: Self-pay

## 2014-02-14 MED ORDER — METOPROLOL SUCCINATE ER 50 MG PO TB24: ORAL_TABLET | ORAL | Status: DC

## 2014-02-14 MED ORDER — AMLODIPINE BESYLATE 5 MG PO TABS: 5.0000 mg | ORAL_TABLET | Freq: Every day | ORAL | Status: DC

## 2014-02-20 ENCOUNTER — Ambulatory Visit (INDEPENDENT_AMBULATORY_CARE_PROVIDER_SITE_OTHER): Payer: Medicare Other | Admitting: *Deleted

## 2014-02-20 DIAGNOSIS — Z952 Presence of prosthetic heart valve: Secondary | ICD-10-CM

## 2014-02-20 DIAGNOSIS — Z954 Presence of other heart-valve replacement: Secondary | ICD-10-CM

## 2014-02-20 DIAGNOSIS — Z7901 Long term (current) use of anticoagulants: Secondary | ICD-10-CM

## 2014-02-20 LAB — POCT INR: INR: 3.5

## 2014-03-06 ENCOUNTER — Ambulatory Visit (INDEPENDENT_AMBULATORY_CARE_PROVIDER_SITE_OTHER): Payer: Medicare Other | Admitting: *Deleted

## 2014-03-06 DIAGNOSIS — Z952 Presence of prosthetic heart valve: Secondary | ICD-10-CM

## 2014-03-06 DIAGNOSIS — Z954 Presence of other heart-valve replacement: Secondary | ICD-10-CM

## 2014-03-06 DIAGNOSIS — Z7901 Long term (current) use of anticoagulants: Secondary | ICD-10-CM

## 2014-03-06 LAB — POCT INR: INR: 4

## 2014-03-20 ENCOUNTER — Ambulatory Visit: Payer: Medicare Other

## 2014-03-20 ENCOUNTER — Ambulatory Visit (INDEPENDENT_AMBULATORY_CARE_PROVIDER_SITE_OTHER): Payer: 59 | Admitting: *Deleted

## 2014-03-20 DIAGNOSIS — Z954 Presence of other heart-valve replacement: Secondary | ICD-10-CM

## 2014-03-20 DIAGNOSIS — Z7901 Long term (current) use of anticoagulants: Secondary | ICD-10-CM

## 2014-03-20 DIAGNOSIS — Z952 Presence of prosthetic heart valve: Secondary | ICD-10-CM

## 2014-03-20 LAB — POCT INR: INR: 2.8

## 2014-04-03 ENCOUNTER — Ambulatory Visit (INDEPENDENT_AMBULATORY_CARE_PROVIDER_SITE_OTHER): Payer: 59 | Admitting: *Deleted

## 2014-04-03 ENCOUNTER — Ambulatory Visit: Payer: 59

## 2014-04-03 DIAGNOSIS — Z7901 Long term (current) use of anticoagulants: Secondary | ICD-10-CM

## 2014-04-03 DIAGNOSIS — Z954 Presence of other heart-valve replacement: Secondary | ICD-10-CM

## 2014-04-03 DIAGNOSIS — Z952 Presence of prosthetic heart valve: Secondary | ICD-10-CM

## 2014-04-03 LAB — POCT INR: INR: 2.4

## 2014-04-17 ENCOUNTER — Ambulatory Visit (INDEPENDENT_AMBULATORY_CARE_PROVIDER_SITE_OTHER): Payer: 59 | Admitting: *Deleted

## 2014-04-17 DIAGNOSIS — Z954 Presence of other heart-valve replacement: Secondary | ICD-10-CM | POA: Diagnosis not present

## 2014-04-17 DIAGNOSIS — Z952 Presence of prosthetic heart valve: Secondary | ICD-10-CM

## 2014-04-17 DIAGNOSIS — Z7901 Long term (current) use of anticoagulants: Secondary | ICD-10-CM | POA: Diagnosis not present

## 2014-04-17 LAB — POCT INR: INR: 2.6

## 2014-04-18 ENCOUNTER — Ambulatory Visit: Payer: 59

## 2014-05-15 ENCOUNTER — Ambulatory Visit (INDEPENDENT_AMBULATORY_CARE_PROVIDER_SITE_OTHER): Payer: Medicare Other | Admitting: *Deleted

## 2014-05-15 ENCOUNTER — Other Ambulatory Visit: Payer: Self-pay | Admitting: *Deleted

## 2014-05-15 DIAGNOSIS — Z954 Presence of other heart-valve replacement: Secondary | ICD-10-CM | POA: Diagnosis not present

## 2014-05-15 DIAGNOSIS — Z7901 Long term (current) use of anticoagulants: Secondary | ICD-10-CM

## 2014-05-15 DIAGNOSIS — Z952 Presence of prosthetic heart valve: Secondary | ICD-10-CM

## 2014-05-15 LAB — POCT INR: INR: 2.5

## 2014-06-12 ENCOUNTER — Ambulatory Visit (INDEPENDENT_AMBULATORY_CARE_PROVIDER_SITE_OTHER): Payer: Medicare Other | Admitting: *Deleted

## 2014-06-12 DIAGNOSIS — Z7901 Long term (current) use of anticoagulants: Secondary | ICD-10-CM

## 2014-06-12 DIAGNOSIS — Z952 Presence of prosthetic heart valve: Secondary | ICD-10-CM

## 2014-06-12 DIAGNOSIS — Z954 Presence of other heart-valve replacement: Secondary | ICD-10-CM | POA: Diagnosis not present

## 2014-06-12 LAB — POCT INR: INR: 2.9

## 2014-07-10 ENCOUNTER — Ambulatory Visit (INDEPENDENT_AMBULATORY_CARE_PROVIDER_SITE_OTHER): Payer: Medicare Other | Admitting: *Deleted

## 2014-07-10 DIAGNOSIS — Z7901 Long term (current) use of anticoagulants: Secondary | ICD-10-CM | POA: Diagnosis not present

## 2014-07-10 DIAGNOSIS — Z952 Presence of prosthetic heart valve: Secondary | ICD-10-CM

## 2014-07-10 DIAGNOSIS — Z954 Presence of other heart-valve replacement: Secondary | ICD-10-CM

## 2014-07-10 LAB — POCT INR: INR: 3.2

## 2014-08-14 ENCOUNTER — Other Ambulatory Visit: Payer: Self-pay

## 2014-08-14 ENCOUNTER — Ambulatory Visit (INDEPENDENT_AMBULATORY_CARE_PROVIDER_SITE_OTHER): Payer: Medicare Other | Admitting: *Deleted

## 2014-08-14 DIAGNOSIS — Z954 Presence of other heart-valve replacement: Secondary | ICD-10-CM | POA: Diagnosis not present

## 2014-08-14 DIAGNOSIS — Z952 Presence of prosthetic heart valve: Secondary | ICD-10-CM

## 2014-08-14 DIAGNOSIS — Z7901 Long term (current) use of anticoagulants: Secondary | ICD-10-CM

## 2014-08-14 LAB — POCT INR: INR: 2.8

## 2014-08-14 MED ORDER — AMLODIPINE BESYLATE 5 MG PO TABS: 5.0000 mg | ORAL_TABLET | Freq: Every day | ORAL | Status: DC

## 2014-08-26 IMAGING — CT CT ABD-PELV W/ CM
2 of 5 series · 17 of 46 positions shown, 19 images · IV contrast (Omnipaque 300)
Comparison: None.

CLINICAL DATA: Left renal mass seen on recent chest CT.

CT ABDOMEN AND PELVIS WITH CONTRAST
TECHNIQUE: Multidetector CT imaging of the abdomen and pelvis was
performed following the standard protocol during bolus
administration of intravenous contrast.
Contrast: 100mL OMNIPAQUE IOHEXOL 300 MG/ML  SOLN

[Series 2: abd/ pel 5mm · axial · 0.70mm/px · z∈[-470,-80]mm · 14 of 88 slices shown, 16 images]
[im 5/88  soft-tissue]
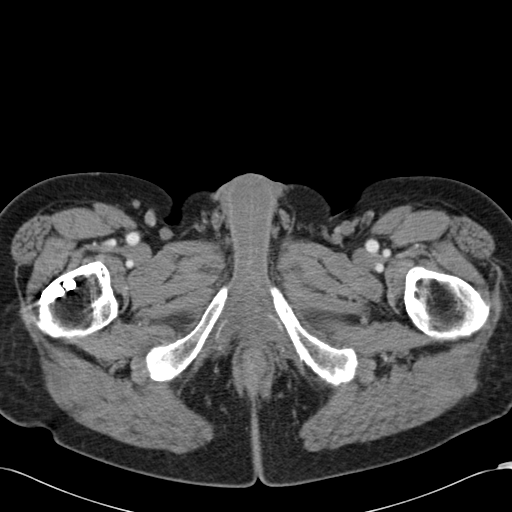
[im 5/88  bone]
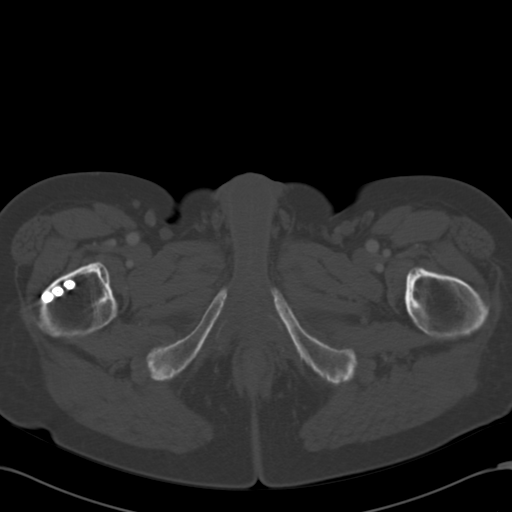
[im 10/88  soft-tissue]
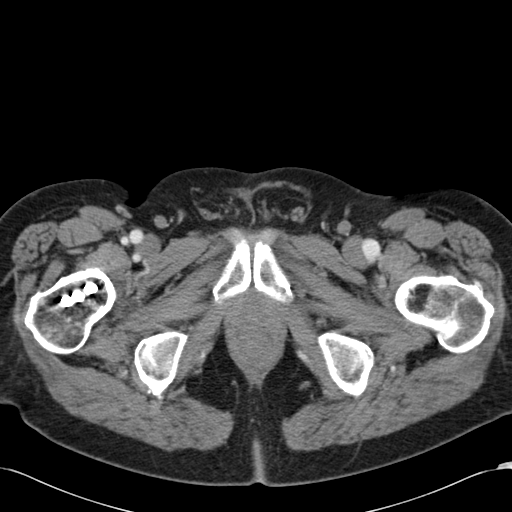
[im 19/88  soft-tissue]
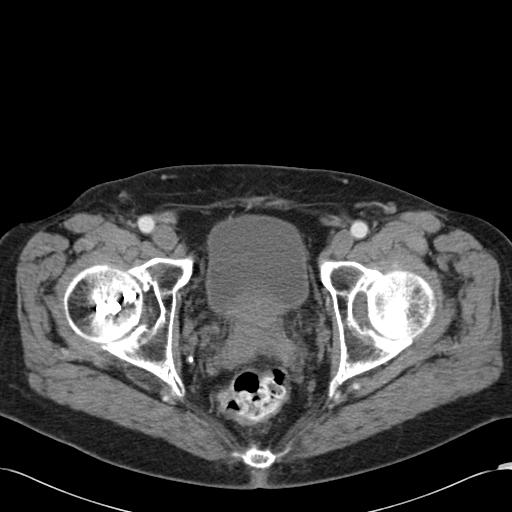
[im 23/88  soft-tissue]
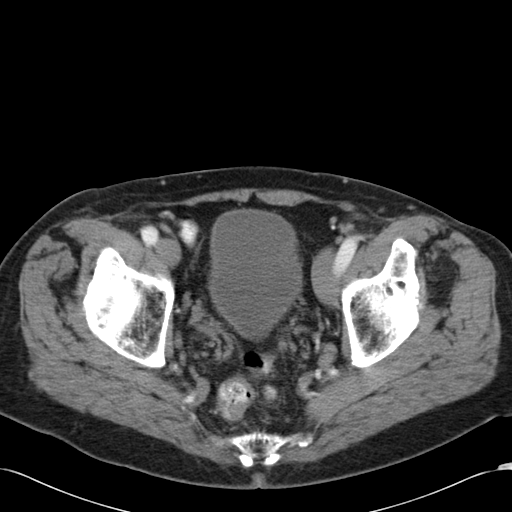
[im 28/88  soft-tissue]
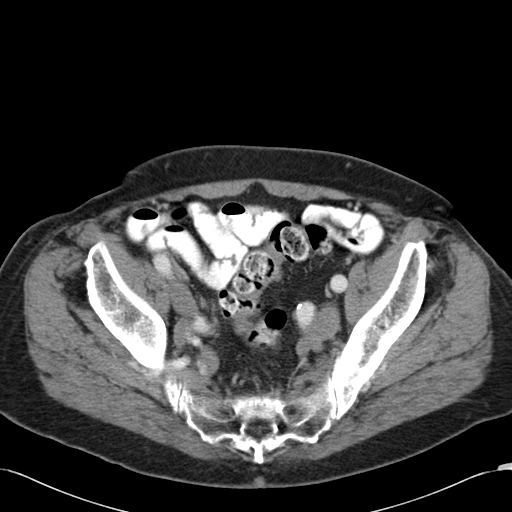
[im 37/88  soft-tissue]
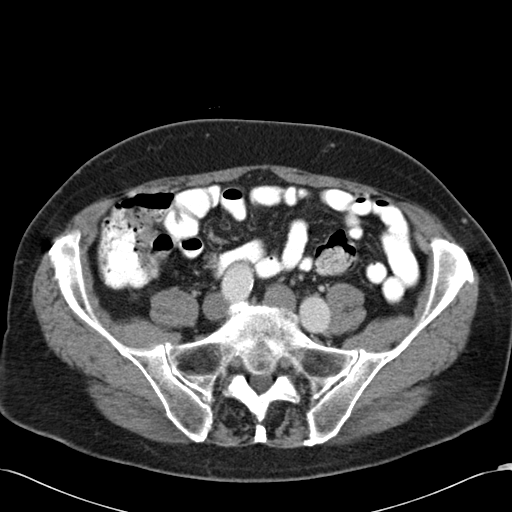
[im 42/88  soft-tissue]
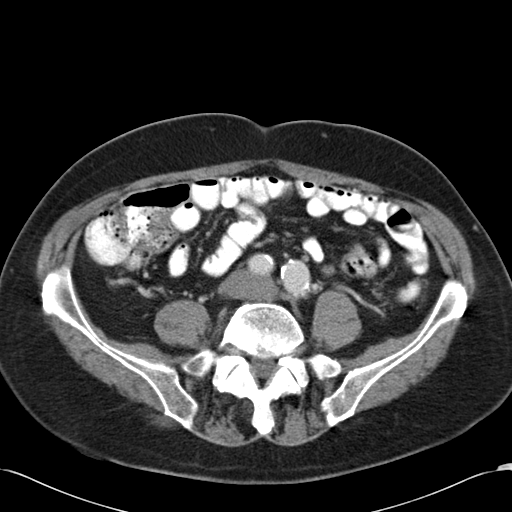
[im 46/88  soft-tissue]
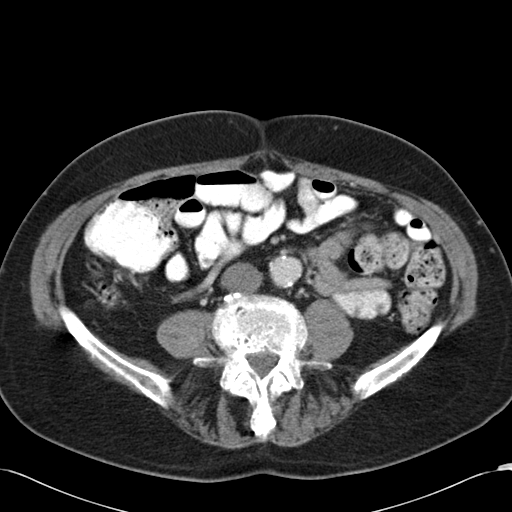
[im 51/88  soft-tissue]
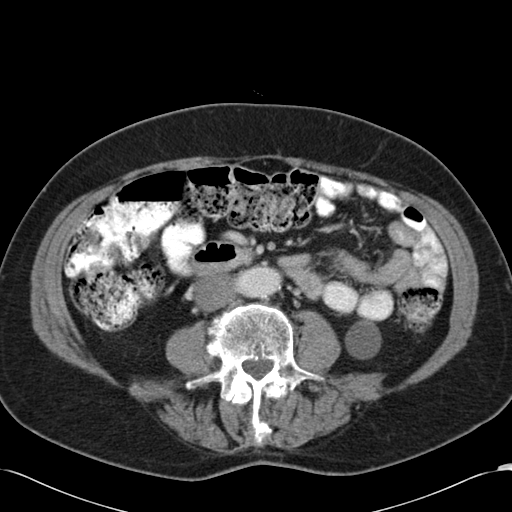
[im 51/88  bone]
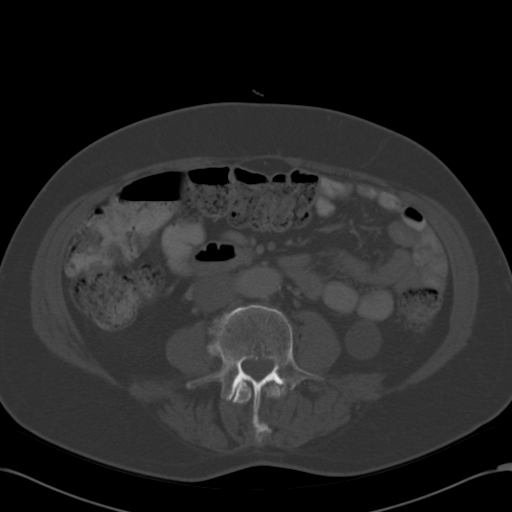
[im 60/88  soft-tissue]
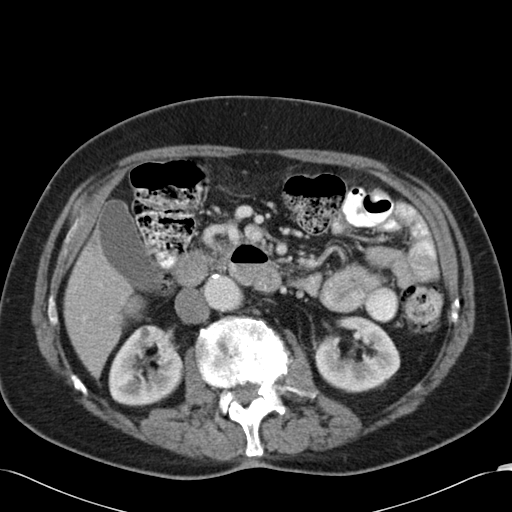
[im 65/88  soft-tissue]
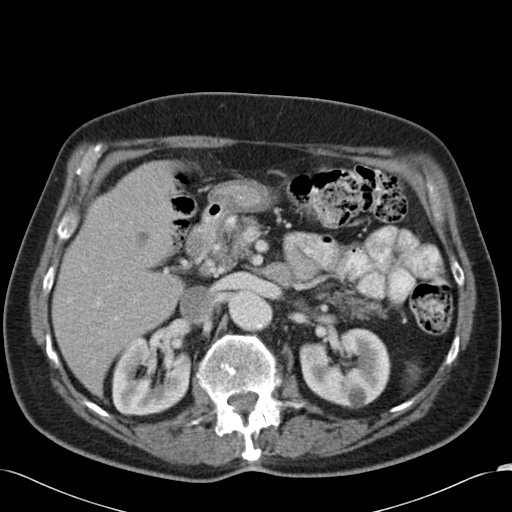
[im 69/88  soft-tissue]
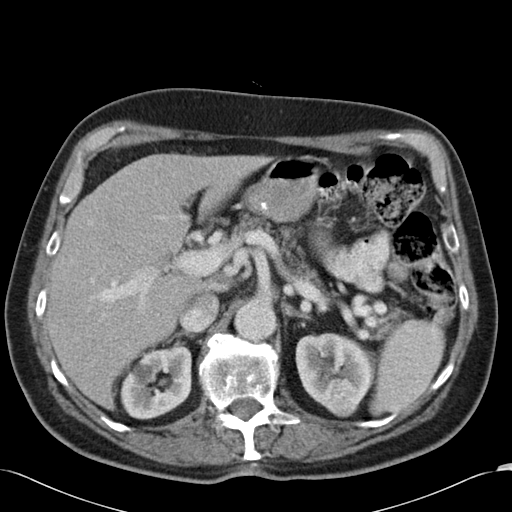
[im 78/88  soft-tissue]
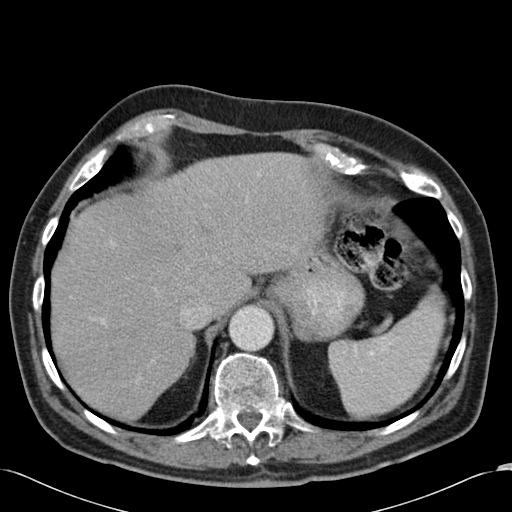
[im 83/88  soft-tissue]
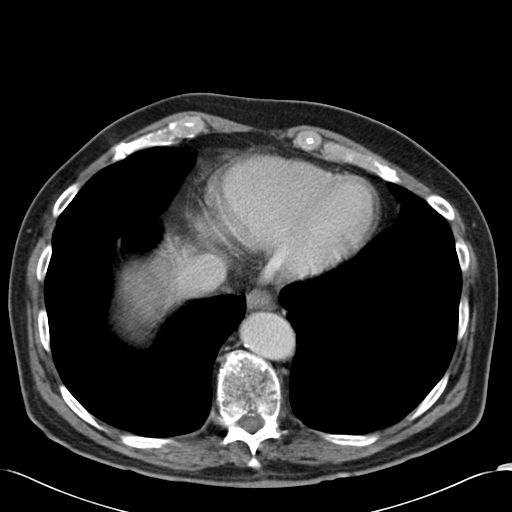

[Series 602: cor · coronal · 0.89mm/px · 3 of 113 slices shown]
[im 38/113  soft-tissue]
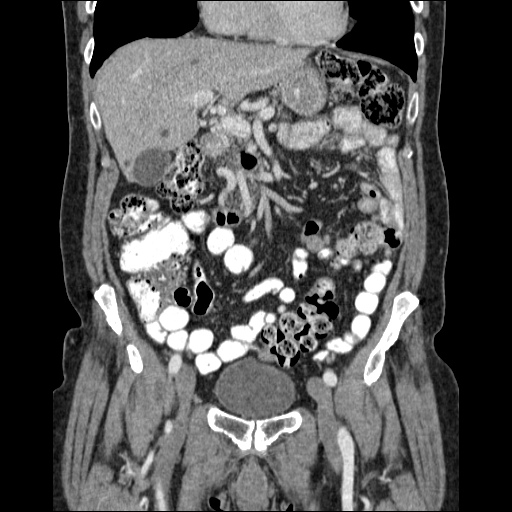
[im 50/113  soft-tissue]
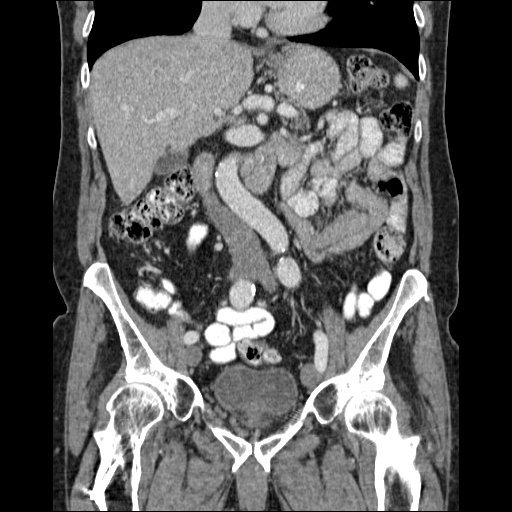
[im 63/113  soft-tissue]
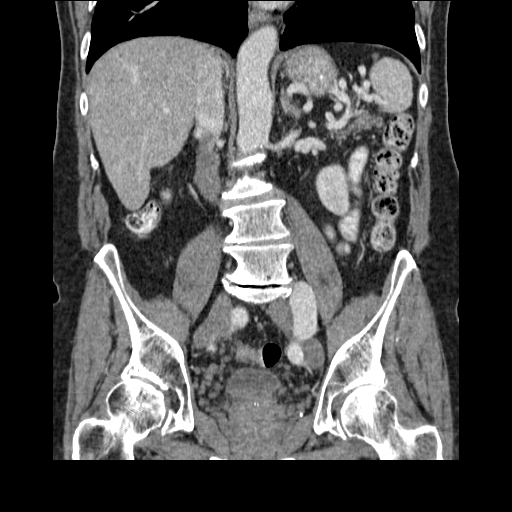

[17 of 46 positions shown; findings below may reference images not displayed]

FINDINGS: A solid hypervascular mass is seen in the posterior upper
pole of the left kidney which measures 2.1 x 2.5 cm.  No
macroscopic fat is seen.  This is consistent with renal cell
carcinoma until proven otherwise.

Other small renal cysts are seen in both kidneys, but no other
renal masses are identified.  No evidence of hydronephrosis.  No
evidence of renal vein thrombus or retroperitoneal lymphadenopathy.

Small hepatic cysts are noted but no liver masses are identified.
Gallbladder is unremarkable.  The pancreas, spleen, and adrenal
glands are normal appearance.  No other soft tissue masses or
lymphadenopathy identified within the abdomen or pelvis.

Mildly enlarged prostate gland is seen.  No evidence of
inflammatory process or abnormal fluid collections.  No evidence of
bowel wall thickening or dilatation. Fusiform dilatation of both
common iliac arteries is seen, measuring 2.2 cm the right and
cm on the left.  No evidence of abdominal aortic aneurysm.  The no
suspicious bone lesions are identified.
IMPRESSION: 1. 2.5 cm solid hypervascular mass in the posterior upper pole left
kidney, consistent with renal cell carcinoma, proven otherwise.
Urology consultation recommended for surgical evaluation.
2.  No evidence of metastatic disease.
3.  Fusiform bilateral common iliac artery aneurysms measuring
cm on the right and 2.1 cm left.
4.  Enlarged prostate.

## 2014-08-28 ENCOUNTER — Other Ambulatory Visit: Payer: Self-pay | Admitting: Cardiology

## 2014-08-28 MED ORDER — METOPROLOL SUCCINATE ER 50 MG PO TB24: ORAL_TABLET | ORAL | Status: DC

## 2014-09-18 ENCOUNTER — Ambulatory Visit (INDEPENDENT_AMBULATORY_CARE_PROVIDER_SITE_OTHER): Payer: Medicare Other | Admitting: *Deleted

## 2014-09-18 DIAGNOSIS — Z7901 Long term (current) use of anticoagulants: Secondary | ICD-10-CM | POA: Diagnosis not present

## 2014-09-18 DIAGNOSIS — Z954 Presence of other heart-valve replacement: Secondary | ICD-10-CM

## 2014-09-18 DIAGNOSIS — Z952 Presence of prosthetic heart valve: Secondary | ICD-10-CM

## 2014-09-18 LAB — POCT INR: INR: 3.1

## 2014-10-23 ENCOUNTER — Ambulatory Visit (INDEPENDENT_AMBULATORY_CARE_PROVIDER_SITE_OTHER): Payer: Medicare Other | Admitting: *Deleted

## 2014-10-23 DIAGNOSIS — Z954 Presence of other heart-valve replacement: Secondary | ICD-10-CM | POA: Diagnosis not present

## 2014-10-23 DIAGNOSIS — Z952 Presence of prosthetic heart valve: Secondary | ICD-10-CM

## 2014-10-23 DIAGNOSIS — Z7901 Long term (current) use of anticoagulants: Secondary | ICD-10-CM

## 2014-10-23 DIAGNOSIS — I4891 Unspecified atrial fibrillation: Secondary | ICD-10-CM

## 2014-10-23 LAB — POCT INR: INR: 3.4

## 2014-10-31 ENCOUNTER — Ambulatory Visit (INDEPENDENT_AMBULATORY_CARE_PROVIDER_SITE_OTHER): Payer: Medicare Other | Admitting: Family Medicine

## 2014-10-31 VITALS — BP 140/90 | HR 89 | Temp 98.0°F | Ht 76.0 in | Wt 197.8 lb

## 2014-10-31 DIAGNOSIS — M170 Bilateral primary osteoarthritis of knee: Secondary | ICD-10-CM

## 2014-10-31 DIAGNOSIS — Z23 Encounter for immunization: Secondary | ICD-10-CM | POA: Diagnosis not present

## 2014-11-04 ENCOUNTER — Encounter: Payer: Self-pay | Admitting: Family Medicine

## 2014-11-04 NOTE — Patient Instructions (Signed)
Total Knee Replacement °Total knee replacement is a procedure to replace your knee joint with an artificial knee joint (prosthetic knee joint). The purpose of this surgery is to reduce pain and improve your knee function. °LET YOUR HEALTH CARE PROVIDER KNOW ABOUT:  °· Any allergies you have. °· All medicines you are taking, including vitamins, herbs, eye drops, creams, and over-the-counter medicines. °· Previous problems you or members of your family have had with the use of anesthetics. °· Any blood disorders you have. °· Previous surgeries you have had. °· Medical conditions you have. °RISKS AND COMPLICATIONS  °Generally, total knee replacement is a safe procedure. However, problems can occur and include: °· Loss of range of motion of the knee or instability. °· Loosening of the prosthesis. °· Infection. °· Persistent pain. °BEFORE THE PROCEDURE  °· Do not eat or drink anything after midnight on the night before the procedure or as directed by your health care provider. °· Ask your health care provider about changing or stopping your regular medicines. This is especially important if you are taking diabetes medicines or blood thinners. °PROCEDURE  °· Just before the procedure, you will receive medicine that will make you drowsy (sedative). This will be given through a tube that is inserted into one of your veins (IV tube). °· Then you will be given one of the following: °¨ A medicine injected into your spine that numbs your body below the waist (spinal anesthetic). °¨ A medicine that makes you fall asleep (general anesthetic). °¨ You may also receive medicine to block feeling in your leg (nerve block) to help ease pain after surgery. °· An incision will be made in your knee. Your surgeon will take out any damaged cartilage and bone by sawing off the damaged surfaces. °· The surgeon will then put a new metal liner over the sawed-off portion of your thigh bone (femur) and a plastic liner over the sawed-off portion  of one of the bones of your lower leg (tibia). This is to restore alignment and function to your knee. A plastic piece is often used to restore the surface of your knee cap. °AFTER THE PROCEDURE  °· You will be taken to the recovery area. °· You may have drainage tubes to drain excess fluid from your knee. These tubes attach to a device that removes these fluids. °· Once you are awake, stable, and taking fluids well, you will be taken to your hospital room. °· You will receive physical therapy as prescribed by your health care provider. °· Your surgeon may recommend that you spend time (usually an additional 10-14 days) in an extended-care facility to help you begin walking again and improve your range of motion before you go home. °· You may also be prescribed blood-thinning medicine to decrease your risk of developing blood clots in your leg. °Document Released: 06/01/2000 Document Revised: 07/10/2013 Document Reviewed: 04/05/2011 °[employer]® Patient Information ©2015 [employer]. This information is not intended to replace advice given to you by your health care provider. Make sure you discuss any questions you have with your health care provider. ° °

## 2014-11-04 NOTE — Assessment & Plan Note (Addendum)
Knee pain and stiffness worsening over the past few years, wants joint replacement, no recent imaging - discussed medical treatment options and cortisone injection, patient declined - referral to orthopedic surgery to discuss knee replacements - advised that he should also schedule with cardiology as he will need their clearance for surgery given his h/o Marfans, aortic valve replacement and thoracic aortic aneurysm

## 2014-11-04 NOTE — Progress Notes (Signed)
   Subjective:   Juan Wilkerson is a 74 y.o. male with a history of knee pain, marfans w/cardiac complications here for f/u knee pain.  Patient has had bilateral knee pain for several years. This has been managed with glucosamine/chondroitin and bid tylenol. Recently he has found that this is inadequate and the pain is limiting his activity. It is most pronounced when he has been seated for >34minutes and attempts to stand. His knees are very stiff and sore and he has to stretch and shake them for a few minutes before he can get up. He thinks he may have had his knee injected at some point many years ago but can't remember how it worked.   Review of Systems:  Per HPI. All other systems reviewed and are negative.   PMH, PSH, Medications, Allergies, and FmHx reviewed and updated in EMR.  Social History: never smoker  Objective:  BP 140/90 mmHg  Pulse 89  Temp(Src) 98 F (36.7 C) (Oral)  Ht 6\' 4"  (1.93 m)  Wt 197 lb 12.8 oz (89.721 kg)  BMI 24.09 kg/m2  Gen:  74 y.o. male in NAD HEENT: NCAT, MMM, EOMI, PERRL, anicteric sclerae CV: RRR, no MRG, no JVD Resp: Non-labored, CTAB, no wheezes noted Abd: Soft, NTND, BS present, no guarding or organomegaly Ext: WWP, no edema MSK: Full ROM, strength intact Neuro: Alert and oriented, speech normal       Chemistry      Component Value Date/Time   NA 141 01/03/2014 0955   K 4.6 01/03/2014 0955   CL 104 01/03/2014 0955   CO2 29 01/03/2014 0955   BUN 17 01/03/2014 0955   CREATININE 0.79 01/03/2014 0955   CREATININE 0.7 01/03/2013 0914      Component Value Date/Time   CALCIUM 9.4 01/03/2014 0955   ALKPHOS 87 01/03/2014 0955   AST 24 01/03/2014 0955   ALT 13 01/03/2014 0955   BILITOT 0.9 01/03/2014 0955      Lab Results  Component Value Date   WBC 6.8 01/03/2014   HGB 15.5 01/03/2014   HCT 45.6 01/03/2014   MCV 89.8 01/03/2014   PLT 137* 01/03/2014   Lab Results  Component Value Date   TSH 1.17 04/29/2009   No results found  for: HGBA1C Assessment:     Juan Wilkerson is a 74 y.o. male here for knee pain    Plan:     See problem list for problem-specific plans.   Frazier Richards, MD PGY-3,  [facility] 11/04/2014  12:13 PM

## 2014-11-26 ENCOUNTER — Other Ambulatory Visit: Payer: Self-pay | Admitting: Cardiology

## 2014-11-27 ENCOUNTER — Ambulatory Visit (INDEPENDENT_AMBULATORY_CARE_PROVIDER_SITE_OTHER): Payer: Medicare Other | Admitting: *Deleted

## 2014-11-27 DIAGNOSIS — Z954 Presence of other heart-valve replacement: Secondary | ICD-10-CM

## 2014-11-27 DIAGNOSIS — Z7901 Long term (current) use of anticoagulants: Secondary | ICD-10-CM

## 2014-11-27 DIAGNOSIS — Z952 Presence of prosthetic heart valve: Secondary | ICD-10-CM

## 2014-11-27 LAB — POCT INR: INR: 3.6

## 2014-12-03 ENCOUNTER — Encounter: Payer: Self-pay | Admitting: Family Medicine

## 2014-12-03 ENCOUNTER — Ambulatory Visit (INDEPENDENT_AMBULATORY_CARE_PROVIDER_SITE_OTHER): Payer: Medicare Other | Admitting: Family Medicine

## 2014-12-03 ENCOUNTER — Ambulatory Visit: Payer: Medicare Other | Admitting: Family Medicine

## 2014-12-03 VITALS — BP 139/82 | HR 94 | Temp 97.9°F | Wt 198.0 lb

## 2014-12-03 DIAGNOSIS — R238 Other skin changes: Secondary | ICD-10-CM | POA: Diagnosis not present

## 2014-12-03 DIAGNOSIS — Y92099 Unspecified place in other non-institutional residence as the place of occurrence of the external cause: Secondary | ICD-10-CM

## 2014-12-03 DIAGNOSIS — T148 Other injury of unspecified body region: Secondary | ICD-10-CM | POA: Diagnosis not present

## 2014-12-03 DIAGNOSIS — W19XXXA Unspecified fall, initial encounter: Secondary | ICD-10-CM | POA: Diagnosis not present

## 2014-12-03 DIAGNOSIS — T148XXA Other injury of unspecified body region, initial encounter: Secondary | ICD-10-CM | POA: Insufficient documentation

## 2014-12-03 DIAGNOSIS — Y92009 Unspecified place in unspecified non-institutional (private) residence as the place of occurrence of the external cause: Secondary | ICD-10-CM

## 2014-12-03 DIAGNOSIS — R233 Spontaneous ecchymoses: Secondary | ICD-10-CM

## 2014-12-03 NOTE — Patient Instructions (Signed)
Dear Juan Wilkerson, Thank you for coming in to clinic today.  1. Overall it sounds like you are doing well. 2. Your bruising is expected on your Coumadin. Even with mild trauma this can happen. You probably had a hematoma or collection of blood in your Right thigh and gravity pulled it down to lower thigh. You do not have any significant pain or difficulty moving limbs, so I think this is reassuring. - May try ice packs or heating pad, whichever helps 3. Keep taking Coumadin dose. 4. Recommend re-schedule INR check to 1-2 weeks from now.  Most important concern would be at risk for a Head Bleed (Subdural Hematoma) - if you develop return of headache, worsening, confusion, weakness, or pass out, this would be an emergency and need to get to ED for CT Scan of head.  Please schedule a follow-up appointment with Dr. Sherril Cong as needed within 1 month for follow-up.  If bruising worsening or develop pain, please return sooner.  If you have any other questions or concerns, please feel free to call the clinic to contact me. You may also schedule an earlier appointment if necessary.  However, if your symptoms get significantly worse, please go to the Emergency Department to seek immediate medical attention.  Nobie Putnam, [place]

## 2014-12-03 NOTE — Progress Notes (Signed)
Subjective:    Patient ID: Juan Wilkerson, male    DOB: 09-03-1940, 74 y.o.   MRN: [id number]  Juan Wilkerson is a 74 y.o. male presenting on 12/03/2014 for Fall and Bleeding/Bruising  HPI  FALL w/ MULTIPLE SITE ECCHYMOSIS / H/o easy bruising: - Reported fall approx 2 weeks ago at home, fell forward and hit forehead and right knee on some "audio equipment" felt some immediate pain and discomfort in these areas. He stated that he "tripped on something walking on back porch" when "foot hit something" and fell forward. Did hit head, had minor headache and some scratches for few hours, gradually resolved, currently denies any active headache. No significant bruising on head. Denies any LOC. He did not seek medical care at that time. Stated that few days later he noticed several bruises on Right thigh and Right shoulder, states that the bruises have been appropriately been changing color and shape but taking a while to heal, one area on on his right thigh was slightly firm and tender. Denies any pain or swelling over bruises. - Taking Coumadin for chronic atrial fibrillation and AVR (INR goal 2.5 to 3.5), stated his last INR on 11/27/14 was 3.6. Does admit to chronic intermittent bruises on extremities, especially in thighs and areas where he can "bump into things" - Wanted to get the bruises checked out today, otherwise no complaints or new symptoms  Past Medical History  Diagnosis Date  . Marfan's syndrome with aortic dilation   . Hypertension   . Aortic aneurysm 1994    From presumed Marfan's Syndrome.  Followed by Dr Stanford Breed.  Aneurysm involving the proximal descending thoracic aorta .    Marland Kitchen Pulmonary nodules     Found on chest CT 08/2007.  Monitoring with yearly CT.  3.65mm nodule in R iddle lobe.  Marland Kitchen Hyperlipidemia   . Atrial fibrillation     Followed by Dr Stanford Breed.  On coumadin.  . Atrial flutter     Followed by Dr Stanford Breed  . Osteoarthritis   . History of blood transfusion     Social  History   Social History  . Marital Status: Divorced    Spouse Name: N/A  . Number of Children: 0  . Years of Education: 16   Occupational History  . [employer]   . 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 Smoking status: Never Smoker   . Smokeless tobacco: Never Used  . Alcohol Use: No  . Drug Use: No  . Sexual Activity: No   Other Topics Concern  . Not on file   Social History Narrative   Divorced, lives alone. Has a daughter (pt does not have contact with her, has not seen her since she was 2).    Studied music in college.   Plays oboe with [employer] [place].    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   No smoking. No alcohol use. No recreational drugs.      Health Care POA:    Emergency Contact: Gerald Stabs [phone]   End of Life Plan: Gave pt the ad pamplet   Who lives with you: Lives by himself   Any pets: 0   Diet: pt diet varies   Exercise: walks and going up and down stairs most days   Seatbelts: pt uses seat belt when in his car   Sun Exposure/Protection: pt uses hats and long sleeves   Hobbies: harmonica  Current Outpatient Prescriptions on File Prior to Visit  Medication Sig  . acetaminophen (TYLENOL) 500 MG tablet Take 1,000 mg by mouth 2 (two) times daily as needed. As needed for pain  . amLODipine (NORVASC) 5 MG tablet Take 1 tablet (5 mg total) by mouth daily.  . Glucosamine-Chondroit-Vit C-Mn (GLUCOSAMINE-CHONDROITIN) TABS Take 2 tablets by mouth every morning.   . hydrocortisone 1 % cream Apply topically. Apply as needed for itching.  . loratadine (CLARITIN) 10 MG tablet Take 10 mg by mouth daily.  . metoprolol succinate (TOPROL-XL) 50 MG 24 hr tablet TAKE 1 TABLET BY MOUTH TWICE A DAY TAKE WITH OR IMMEDIATELY FOLLOWING A MEAL  . Multiple Vitamin (MULTIVITAMIN) capsule Take 1 capsule by mouth every morning.   . pravastatin (PRAVACHOL) 40 MG tablet TAKE 1 TABLET BY MOUTH  EVERY DAY  . tamsulosin (FLOMAX) 0.4 MG CAPS Take 1 capsule (0.4 mg total) by mouth daily.  Marland Kitchen warfarin (COUMADIN) 3 MG tablet Take 1-1.5 tablets (3-4.5 mg total) by mouth daily. Take 1 and a half tablets (4.5 mg) on Saturday, On all other days take 1 tablet (3mg )   No current facility-administered medications on file prior to visit.    Review of Systems  Constitutional: Negative for fever, chills, diaphoresis, activity change, appetite change and fatigue.  HENT: Negative for congestion and hearing loss.   Eyes: Negative for visual disturbance.  Respiratory: Negative for cough, chest tightness, shortness of breath and wheezing.   Cardiovascular: Negative for chest pain, palpitations and leg swelling.  Gastrointestinal: Negative for nausea, vomiting, abdominal pain, diarrhea and constipation.  Genitourinary: Negative for dysuria, frequency and hematuria.  Musculoskeletal: Negative for arthralgias and neck pain.  Skin: Negative for rash.  Neurological: Negative for dizziness, weakness, light-headedness, numbness and headaches.  Hematological: Negative for adenopathy. Bruises/bleeds easily (multiple new bruises Right shoulder anterior, Right thigh anterior lateral and multiple anterior).  Psychiatric/Behavioral: Negative for behavioral problems and confusion.   Per HPI unless specifically indicated above     Objective:    BP 139/82 mmHg  Pulse 94  Temp(Src) 97.9 F (36.6 C) (Oral)  Wt 198 lb (89.812 kg)  Wt Readings from Last 3 Encounters:  12/03/14 198 lb (89.812 kg)  10/31/14 197 lb 12.8 oz (89.721 kg)  02/07/14 207 lb (93.895 kg)    Physical Exam  Constitutional: He is oriented to person, place, and time. He appears well-developed and well-nourished. No distress.  Elderly male, comfortable, conversational, NAD  HENT:  Head: Normocephalic and atraumatic.  Right Ear: External ear normal.  Left Ear: External ear normal.  Mouth/Throat: Oropharynx is clear and moist. No  oropharyngeal exudate.  Scalp non-tender w/o ecchymosis or deformity. No lacerations identified on forehead.  Eyes: Conjunctivae and EOM are normal. Pupils are equal, round, and reactive to light.  Neck: Normal range of motion. Neck supple. No thyromegaly present.  Cardiovascular: Normal rate, regular rhythm, normal heart sounds and intact distal pulses.   No murmur heard. Pulmonary/Chest: Effort normal and breath sounds normal. No respiratory distress. He has no wheezes. He has no rales.  Musculoskeletal: Normal range of motion. He exhibits no edema or tenderness.       Right shoulder: Normal. He exhibits normal range of motion, no tenderness, no bony tenderness, no swelling, no effusion, no deformity, no pain, no spasm and normal strength.  Lymphadenopathy:    He has no cervical adenopathy.  Neurological: He is alert and oriented to person, place, and time.  Skin: Skin is warm and dry. Ecchymosis  noted. He is not diaphoretic.     Nursing note and vitals reviewed.  Results for orders placed or performed in visit on 11/27/14  POCT INR  Result Value Ref Range   INR 3.6              Assessment & Plan:   Problem List Items Addressed This Visit      Other   Easy bruising    Secondary to Coumadin (high INR goal 2.5 to 3.5) with mechanical AVR and afib, in elderly age 28 also increases susceptible to easy bruising.      Traumatic ecchymosis of multiple sites - Primary    Improving, multiple moderate sized R-sided ecchymosis likely due to recent traumatic fall, appropriately healing with one large gravity dependent ecchymosis along R-lower thigh seems to be taking longer. Ecchymosis likely from age 8 yr + Coumadin (high INR goal 2.5 to 3.5, last INR 3.6). No evidence of acute complications. No definite hematoma. No active bleeding or other injury sustained in fall. With fall did hit head, but no LOC and no identified residual injury. No evidence of subdural hematoma, but this  concern was discussed with patient.  Plan: 1. Reassurance, likely self limited will take time to fully resolve 2. Can try ice packs or heating pad PRN relief, at this point may not help 3. Tylenol PRN discomfort 4. Continue current coumadin dosing, advised to re-schedule INR check within 1-2 weeks instead of waiting 3-4 weeks 5. Reviewed fall precautions 6. Strict return criteria if significant worsening ecchymosis (especially R-thigh) and if develop new symptoms with pain or swelling. Additionally, counseled on warning signs of subdural hematoma from fall, when to go to ED       Other Visit Diagnoses    Fall at home, initial encounter           No orders of the defined types were placed in this encounter.      Follow up plan: Return in about 4 weeks (around 12/31/2014), or if symptoms worsen or fail to improve, for fall, ecchymosis.  Nobie Putnam, [place], PGY-3

## 2014-12-04 NOTE — Assessment & Plan Note (Signed)
Secondary to Coumadin (high INR goal 2.5 to 3.5) with mechanical AVR and afib, in elderly age 74 also increases susceptible to easy bruising.

## 2014-12-04 NOTE — Assessment & Plan Note (Signed)
Improving, multiple moderate sized R-sided ecchymosis likely due to recent traumatic fall, appropriately healing with one large gravity dependent ecchymosis along R-lower thigh seems to be taking longer. Ecchymosis likely from age 74 yr + Coumadin (high INR goal 2.5 to 3.5, last INR 3.6). No evidence of acute complications. No definite hematoma. No active bleeding or other injury sustained in fall. With fall did hit head, but no LOC and no identified residual injury. No evidence of subdural hematoma, but this concern was discussed with patient.  Plan: 1. Reassurance, likely self limited will take time to fully resolve 2. Can try ice packs or heating pad PRN relief, at this point may not help 3. Tylenol PRN discomfort 4. Continue current coumadin dosing, advised to re-schedule INR check within 1-2 weeks instead of waiting 3-4 weeks 5. Reviewed fall precautions 6. Strict return criteria if significant worsening ecchymosis (especially R-thigh) and if develop new symptoms with pain or swelling. Additionally, counseled on warning signs of subdural hematoma from fall, when to go to ED

## 2014-12-18 ENCOUNTER — Ambulatory Visit (INDEPENDENT_AMBULATORY_CARE_PROVIDER_SITE_OTHER): Payer: Medicare Other | Admitting: *Deleted

## 2014-12-18 DIAGNOSIS — Z952 Presence of prosthetic heart valve: Secondary | ICD-10-CM

## 2014-12-18 DIAGNOSIS — Z954 Presence of other heart-valve replacement: Secondary | ICD-10-CM

## 2014-12-18 DIAGNOSIS — Z7901 Long term (current) use of anticoagulants: Secondary | ICD-10-CM

## 2014-12-18 LAB — POCT INR: INR: 2.9

## 2014-12-25 ENCOUNTER — Ambulatory Visit: Payer: Medicare Other

## 2014-12-27 ENCOUNTER — Other Ambulatory Visit: Payer: Self-pay

## 2014-12-27 MED ORDER — PRAVASTATIN SODIUM 40 MG PO TABS: ORAL_TABLET | ORAL | Status: DC

## 2014-12-31 NOTE — Progress Notes (Signed)
HPI: FU atrial fibrillation/flutter and St Jude mechanical aortic valve replacement. History of Marfan syndrome, status post St. Jude AVR and aortic reconstruction as well as resection of an aortic aneurysm in 1995, permanent atrial fibrillation/flutter, chronic Coumadin therapy. Echo 11/14 showed normal LV function, moderate LVH, mechanical AVR with trace AI, biatrial enlargement, mild RVE and mild MR; mean gradient across AVR 19 mmHg. CTA November 2015 showed previous thoracic aortic root replacement with aneurysmal disease of the distal arch measuring 4.3 cm. 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 Since last seen, the patient has dyspnea with more extreme activities but not with routine activities. It is relieved with rest. It is not associated with chest pain. There is no orthopnea, PND or pedal edema. There is no syncope or palpitations. There is no exertional chest pain.   Current Outpatient Prescriptions  Medication Sig Dispense Refill  . acetaminophen (TYLENOL) 500 MG tablet Take 1,000 mg by mouth 2 (two) times daily as needed. As needed for pain    . amLODipine (NORVASC) 5 MG tablet Take 1 tablet (5 mg total) by mouth daily. 90 tablet 1  . Glucosamine-Chondroit-Vit C-Mn (GLUCOSAMINE-CHONDROITIN) TABS Take 2 tablets by mouth every morning.     . hydrocortisone 1 % cream Apply topically. Apply as needed for itching.    . loratadine (CLARITIN) 10 MG tablet Take 10 mg by mouth daily.    . metoprolol succinate (TOPROL-XL) 50 MG 24 hr tablet TAKE 1 TABLET BY MOUTH TWICE A DAY TAKE WITH OR IMMEDIATELY FOLLOWING A MEAL 180 tablet 0  . Multiple Vitamin (MULTIVITAMIN) capsule Take 1 capsule by mouth every morning.     . pravastatin (PRAVACHOL) 40 MG tablet TAKE 1 TABLET BY MOUTH EVERY DAY 90 tablet 0  . warfarin (COUMADIN) 3 MG tablet Take 1-1.5 tablets (3-4.5 mg total) by mouth daily. Take 1 and a half tablets (4.5 mg) on Saturday, On all other days  take 1 tablet (3mg ) 45 tablet 11  . tamsulosin (FLOMAX) 0.4 MG CAPS Take 1 capsule (0.4 mg total) by mouth daily. (Patient not taking: Reported on 01/03/2015) 30 capsule 0   No current facility-administered medications for this visit.     Past Medical History  Diagnosis Date  . Marfan's syndrome with aortic dilation   . Hypertension   . Aortic aneurysm ([place]) 1994    From presumed Marfan's Syndrome.  Followed by Dr Stanford Breed.  Aneurysm involving the proximal descending thoracic aorta .    Marland Kitchen Pulmonary nodules     Found on chest CT 08/2007.  Monitoring with yearly CT.  3.36mm nodule in R iddle lobe.  Marland Kitchen Hyperlipidemia   . Atrial fibrillation ([place])     Followed by Dr Stanford Breed.  On coumadin.  . Atrial flutter ([place])     Followed by Dr Stanford Breed  . Osteoarthritis   . History of blood transfusion     Past Surgical History  Procedure Laterality Date  . Virtual colonoscopy  11/01/2005    Pt on chronic coumadin for Aortic valve replacement and Atrial fibrilliation therefore at high risk if stopped.  Need to repeat every 5 yrs.   . Aortic valve replacement  03/1993    St Jude Valve  . Hip fracture surgery  03/1992    s/p R hip Fx  . Vascular surgery    . Cardiac valve replacement    . Kidney surgery    . Coronary artery bypass graft  Social History   Social History  . Marital Status: Divorced    Spouse Name: N/A  . Number of Children: 0  . Years of Education: 16   Occupational History  . [employer]   . 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 Smoking status: Never Smoker   . Smokeless tobacco: Never Used  . Alcohol Use: No  . Drug Use: No  . Sexual Activity: No   Other Topics Concern  . Not on file   Social History Narrative   Divorced, lives alone. Has a daughter (pt does not have contact with her, has not seen her since she was 38).    Studied music in college.   Plays oboe with [employer] [place] [employer].    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   No smoking. No alcohol use. No recreational drugs.      Health Care POA:    Emergency Contact: Gerald Stabs [phone]   End of Life Plan: Gave pt the ad pamplet   Who lives with you: Lives by himself   Any pets: 0   Diet: pt diet varies   Exercise: walks and going up and down stairs most days   Seatbelts: pt uses seat belt when in his car   Sun Exposure/Protection: pt uses hats and long sleeves   Hobbies: harmonica                   ROS: Knee arthralgias but no fevers or chills, productive cough, hemoptysis, dysphasia, odynophagia, melena, hematochezia, dysuria, hematuria, rash, seizure activity, orthopnea, PND, pedal edema, claudication. Remaining systems are negative.  Physical Exam: Well-developed well-nourished in no acute distress.  Skin is warm and dry.  HEENT is normal.  Neck is supple.  Chest is clear to auscultation with normal expansion.  Cardiovascular exam is irregular, Chrisp mechanical valve sound. No diastolic murmur noted. Abdominal exam nontender or distended. No masses palpated. Extremities show no edema. neuro grossly intact  ECG Atrial flutter at a rate of 85. Left anterior fascicular block. Left ventricular hypertrophy.

## 2015-01-03 ENCOUNTER — Ambulatory Visit (INDEPENDENT_AMBULATORY_CARE_PROVIDER_SITE_OTHER): Payer: Medicare Other | Admitting: Cardiology

## 2015-01-03 ENCOUNTER — Encounter: Payer: Self-pay | Admitting: Cardiology

## 2015-01-03 VITALS — BP 150/82 | HR 85 | Ht 76.0 in | Wt 201.7 lb

## 2015-01-03 DIAGNOSIS — E7800 Pure hypercholesterolemia, unspecified: Secondary | ICD-10-CM

## 2015-01-03 DIAGNOSIS — I10 Essential (primary) hypertension: Secondary | ICD-10-CM

## 2015-01-03 DIAGNOSIS — I712 Thoracic aortic aneurysm, without rupture, unspecified: Secondary | ICD-10-CM

## 2015-01-03 DIAGNOSIS — I4892 Unspecified atrial flutter: Secondary | ICD-10-CM | POA: Diagnosis not present

## 2015-01-03 LAB — HEPATIC FUNCTION PANEL
ALBUMIN: 3.9 g/dL (ref 3.6–5.1)
ALT: 13 U/L (ref 9–46)
AST: 23 U/L (ref 10–35)
Alkaline Phosphatase: 95 U/L (ref 40–115)
BILIRUBIN INDIRECT: 0.6 mg/dL (ref 0.2–1.2)
Bilirubin, Direct: 0.1 mg/dL (ref <=0.2)
TOTAL PROTEIN: 6.7 g/dL (ref 6.1–8.1)
Total Bilirubin: 0.7 mg/dL (ref 0.2–1.2)

## 2015-01-03 LAB — LIPID PANEL
CHOLESTEROL: 167 mg/dL (ref 125–200)
HDL: 41 mg/dL (ref >=40)
LDL CALC: 95 mg/dL (ref <130)
TRIGLYCERIDES: 154 mg/dL — AB (ref <150)
Total CHOL/HDL Ratio: 4.1 Ratio (ref <=5.0)
VLDL: 31 mg/dL — AB (ref <30)

## 2015-01-03 LAB — BASIC METABOLIC PANEL
BUN: 19 mg/dL (ref 7–25)
CHLORIDE: 103 mmol/L (ref 98–110)
CO2: 30 mmol/L (ref 20–31)
Calcium: 9 mg/dL (ref 8.6–10.3)
Creat: 0.74 mg/dL (ref 0.70–1.18)
Glucose, Bld: 95 mg/dL (ref 65–99)
POTASSIUM: 4.6 mmol/L (ref 3.5–5.3)
Sodium: 141 mmol/L (ref 135–146)

## 2015-01-03 LAB — CBC
HEMATOCRIT: 42.8 % (ref 39.0–52.0)
Hemoglobin: 14.6 g/dL (ref 13.0–17.0)
MCH: 31.2 pg (ref 26.0–34.0)
MCHC: 34.1 g/dL (ref 30.0–36.0)
MCV: 91.5 fL (ref 78.0–100.0)
MPV: 9.1 fL (ref 8.6–12.4)
PLATELETS: 138 10*3/uL — AB (ref 150–400)
RBC: 4.68 MIL/uL (ref 4.22–5.81)
RDW: 13.8 % (ref 11.5–15.5)
WBC: 5.3 10*3/uL (ref 4.0–10.5)

## 2015-01-03 MED ORDER — AMLODIPINE BESYLATE 10 MG PO TABS: 10.0000 mg | ORAL_TABLET | Freq: Every day | ORAL | Status: DC

## 2015-01-03 NOTE — Assessment & Plan Note (Addendum)
Continue statin. Check lipids and liver. I will also check renal function.

## 2015-01-03 NOTE — Assessment & Plan Note (Signed)
Plan repeat CTA to evaluate thoracic aneurysm and iliac aneurysms.

## 2015-01-03 NOTE — Assessment & Plan Note (Signed)
Continue SBE prophylaxis. 

## 2015-01-03 NOTE — Assessment & Plan Note (Signed)
Continue beta blocker for rate control. Continue Coumadin. Check hemoglobin. 

## 2015-01-03 NOTE — Patient Instructions (Signed)
Medication Instructions:   INCREASE AMLODIPINE TO 10 MG ONCE DAILY= 2 OF THE 5 MG TABLETS ONCE DAILY  Labwork:  Your physician recommends that you HAVE LAB WORK TODAY  Follow-Up:  Your physician wants you to follow-up in: ONE YEAR WITH DR CRENSHAW You will receive a reminder letter in the mail two months in advance. If you don't receive a letter, please call our office to schedule the follow-up appointment.   If you need a refill on your cardiac medications before your next appointment, please call your pharmacy.  

## 2015-01-03 NOTE — Assessment & Plan Note (Addendum)
Blood pressure elevated. Increase amlodipine to 10 mg daily and follow. 

## 2015-01-08 ENCOUNTER — Encounter: Payer: Self-pay | Admitting: *Deleted

## 2015-01-15 ENCOUNTER — Ambulatory Visit (INDEPENDENT_AMBULATORY_CARE_PROVIDER_SITE_OTHER): Payer: Medicare Other

## 2015-01-15 ENCOUNTER — Ambulatory Visit (INDEPENDENT_AMBULATORY_CARE_PROVIDER_SITE_OTHER): Payer: Medicare Other | Admitting: *Deleted

## 2015-01-15 DIAGNOSIS — Z952 Presence of prosthetic heart valve: Secondary | ICD-10-CM

## 2015-01-15 DIAGNOSIS — Z23 Encounter for immunization: Secondary | ICD-10-CM | POA: Diagnosis not present

## 2015-01-15 DIAGNOSIS — Z7901 Long term (current) use of anticoagulants: Secondary | ICD-10-CM | POA: Diagnosis not present

## 2015-01-15 DIAGNOSIS — Z954 Presence of other heart-valve replacement: Secondary | ICD-10-CM | POA: Diagnosis not present

## 2015-01-15 LAB — POCT INR: INR: 4.3

## 2015-01-17 ENCOUNTER — Other Ambulatory Visit (HOSPITAL_COMMUNITY): Payer: Self-pay | Admitting: Interventional Radiology

## 2015-01-17 DIAGNOSIS — N2889 Other specified disorders of kidney and ureter: Secondary | ICD-10-CM

## 2015-01-29 ENCOUNTER — Other Ambulatory Visit (HOSPITAL_COMMUNITY): Payer: Self-pay | Admitting: Interventional Radiology

## 2015-01-29 ENCOUNTER — Ambulatory Visit: Payer: Medicare Other

## 2015-01-29 DIAGNOSIS — N2889 Other specified disorders of kidney and ureter: Secondary | ICD-10-CM

## 2015-01-30 ENCOUNTER — Ambulatory Visit (INDEPENDENT_AMBULATORY_CARE_PROVIDER_SITE_OTHER): Payer: Medicare Other | Admitting: *Deleted

## 2015-01-30 DIAGNOSIS — Z7901 Long term (current) use of anticoagulants: Secondary | ICD-10-CM

## 2015-01-30 LAB — POCT INR: INR: 3.4

## 2015-02-13 ENCOUNTER — Ambulatory Visit (HOSPITAL_COMMUNITY)
Admission: RE | Admit: 2015-02-13 | Discharge: 2015-02-13 | Disposition: A | Discharge status: Home / Self Care | Payer: Medicare Other | Source: Ambulatory Visit | Attending: Interventional Radiology | Admitting: Interventional Radiology

## 2015-02-13 ENCOUNTER — Telehealth: Payer: Self-pay | Admitting: Cardiology

## 2015-02-13 ENCOUNTER — Ambulatory Visit
Admission: RE | Admit: 2015-02-13 | Discharge: 2015-02-13 | Disposition: A | Discharge status: Home / Self Care | Payer: Medicare Other | Source: Ambulatory Visit | Attending: Interventional Radiology | Admitting: Interventional Radiology

## 2015-02-13 ENCOUNTER — Encounter (HOSPITAL_COMMUNITY): Payer: Self-pay

## 2015-02-13 DIAGNOSIS — I712 Thoracic aortic aneurysm, without rupture, unspecified: Secondary | ICD-10-CM

## 2015-02-13 DIAGNOSIS — N2889 Other specified disorders of kidney and ureter: Secondary | ICD-10-CM | POA: Diagnosis present

## 2015-02-13 DIAGNOSIS — Z09 Encounter for follow-up examination after completed treatment for conditions other than malignant neoplasm: Secondary | ICD-10-CM | POA: Diagnosis not present

## 2015-02-13 DIAGNOSIS — Z9889 Other specified postprocedural states: Secondary | ICD-10-CM | POA: Insufficient documentation

## 2015-02-13 DIAGNOSIS — Z87448 Personal history of other diseases of urinary system: Secondary | ICD-10-CM | POA: Diagnosis not present

## 2015-02-13 DIAGNOSIS — I723 Aneurysm of iliac artery: Secondary | ICD-10-CM | POA: Insufficient documentation

## 2015-02-13 DIAGNOSIS — D3002 Benign neoplasm of left kidney: Secondary | ICD-10-CM | POA: Insufficient documentation

## 2015-02-13 MED ORDER — IOHEXOL 350 MG/ML SOLN
100.0000 mL | Freq: Once | INTRAVENOUS | Status: AC | PRN
  Administered 2015-02-13: 100 mL via INTRAVENOUS

## 2015-02-13 NOTE — Telephone Encounter (Signed)
Received call from [place] with [facility].Dr.Crenshaw advised needs CTA of thoracic aorta.

## 2015-02-13 NOTE — Telephone Encounter (Signed)
Received call from [place] at [facility] she stated patient had a CT angio of abdomen and pelvis this morning.She wanted to ask Dr.Crenshaw if ok to do a CT angio of chest or MRI, she was concerned of too much radiation.Message sent to [place].

## 2015-02-13 NOTE — Telephone Encounter (Signed)
Needs CTA of thoracic aorta Kirk Ruths

## 2015-02-13 NOTE — Telephone Encounter (Signed)
Returned call to [place] with [facility].[place].

## 2015-02-13 NOTE — Progress Notes (Signed)
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  History of Present Illness: Juan Wilkerson is a 74 y.o. 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 He has done well since that time with no evidence of tumor recurrence. His current only complaint is continued significant chronic bilateral knee pain due to arthritis which limits his ability to walk longer distances. He is in the process of receiving orthopedic opinions regarding possible knee replacement.  Past Medical History  Diagnosis Date  . Marfan's syndrome with aortic dilation   . Hypertension   . Aortic aneurysm ([place]) 1994    From presumed Marfan's Syndrome.  Followed by Dr Stanford Breed.  Aneurysm involving the proximal descending thoracic aorta .    Marland Kitchen Pulmonary nodules     Found on chest CT 08/2007.  Monitoring with yearly CT.  3.25mm nodule in R iddle lobe.  Marland Kitchen Hyperlipidemia   . Atrial fibrillation ([place])     Followed by Dr Stanford Breed.  On coumadin.  . Atrial flutter ([place])     Followed by Dr Stanford Breed  . Osteoarthritis   . History of blood transfusion     Past Surgical History  Procedure Laterality Date  . Virtual colonoscopy  11/01/2005    Pt on chronic coumadin for Aortic valve replacement and Atrial fibrilliation therefore at high risk if stopped.  Need to repeat every 5 yrs.   . Aortic valve replacement  03/1993    St Jude Valve  . Hip fracture surgery  03/1992    s/p R hip Fx  . Vascular surgery    . Cardiac valve replacement    . Kidney surgery    . Coronary artery bypass graft      Allergies: Lemon oil  Medications: Prior to Admission medications   Medication Sig Start Date End Date Taking? Authorizing Provider  acetaminophen (TYLENOL) 500 MG tablet Take 1,000 mg by mouth 2 (two) times daily as needed. As needed for pain   Yes Historical  Provider, MD  amLODipine (NORVASC) 10 MG tablet Take 1 tablet (10 mg total) by mouth daily. 01/03/15  Yes Lelon Perla, MD  Glucosamine-Chondroit-Vit C-Mn (GLUCOSAMINE-CHONDROITIN) TABS Take 2 tablets by mouth every morning.    Yes Historical Provider, MD  hydrocortisone 1 % cream Apply topically. Apply as needed for itching.   Yes Historical Provider, MD  loratadine (CLARITIN) 10 MG tablet Take 10 mg by mouth daily.   Yes Historical Provider, MD  Multiple Vitamin (MULTIVITAMIN) capsule Take 1 capsule by mouth every morning.    Yes Historical Provider, MD  pravastatin (PRAVACHOL) 40 MG tablet TAKE 1 TABLET BY MOUTH EVERY DAY 12/27/14  Yes Lelon Perla, MD  warfarin (COUMADIN) 3 MG tablet Take 1-1.5 tablets (3-4.5 mg total) by mouth daily. Take 1 and a half tablets (4.5 mg) on Saturday, On all other days take 1 tablet (3mg ) 04/28/13  Yes Frazier Richards, MD  metoprolol succinate (TOPROL-XL) 50 MG 24 hr tablet TAKE 1 TABLET BY MOUTH TWICE A DAY TAKE WITH OR IMMEDIATELY FOLLOWING A MEAL Patient not taking: Reported on 02/13/2015 11/27/14   Lelon Perla, MD  tamsulosin (FLOMAX) 0.4 MG CAPS Take 1 capsule (0.4 mg total) by mouth daily. Patient not taking: Reported on 01/03/2015 09/29/12   Frazier Richards, MD     Family History  Problem Relation Age of Onset  .  Cancer Brother   . Hypertension Brother   . Aortic aneurysm Maternal Aunt     Social History   Social History  . Marital Status: Divorced    Spouse Name: N/A  . Number of Children: 0  . Years of Education: 16   Occupational History  . [employer]   . 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 Smoking status: Never Smoker   . Smokeless tobacco: Never Used  . Alcohol Use: No  . Drug Use: No  . Sexual Activity: No   Other Topics Concern  . Not on file   Social History Narrative   Divorced, lives alone. Has a daughter (pt does not have contact with her, has not seen her since she was 36).    Studied music  in college.   Plays oboe with [employer].    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   No smoking. No alcohol use. No recreational drugs.      Health Care POA:    Emergency Contact: Gerald Stabs [phone]   End of Life Plan: Gave pt the ad pamplet   Who lives with you: Lives by himself   Any pets: 0   Diet: pt diet varies   Exercise: walks and going up and down stairs most days   Seatbelts: pt uses seat belt when in his car   Sun Exposure/Protection: pt uses hats and long sleeves   Hobbies: harmonica                    Review of Systems: A 12 point ROS discussed and pertinent positives are indicated in the HPI above.  All other systems are negative.  Review of Systems  Constitutional: Negative.   Respiratory: Negative.   Cardiovascular: Negative.   Gastrointestinal: Negative.   Genitourinary: Negative.   Musculoskeletal: Positive for arthralgias. Negative for back pain.       Chronic bilateral knee pain.  Neurological: Negative.     Vital Signs: BP 130/76 mmHg  Pulse 89  Temp(Src) 97.7 F (36.5 C)  Resp 18  SpO2 95%  Physical Exam  Mallampati Score:     Imaging: Ct Angio Abd/pel W/ And/or W/o  02/13/2015  CLINICAL DATA:  Lt. Renal mass, Cryoablation of lt. Renal oncocytoma-3 years, protocol Per Dr. Kathlene Cote, constipation EXAM: CT ANGIOGRAPHY ABDOMEN AND PELVIS TECHNIQUE: Multidetector CT imaging of the abdomen and pelvis was performed using the standard protocol during bolus administration of intravenous contrast. Multiplanar reconstructed images including MIPs were obtained and reviewed to evaluate the vascular anatomy. CONTRAST:  117mL OMNIPAQUE IOHEXOL 350 MG/ML SOLN COMPARISON:  COMPARISON 01/10/2014 FINDINGS: ARTERIAL FINDINGS: Aorta: Tortuous. The visualized distal descending thoracic segment measures 3.2 cm maximum transverse diameter. Suprarenal aorta 2.9 cm diameter. Infrarenal aorta 2.3 cm. No dissection or  stenosis. Scattered atheromatous calcified plaque in the infrarenal segment. Celiac axis: Short-segment high-grade stenosis just beyond the origin with poststenotic dilatation, patent distally. Superior mesenteric: Patent. There is a dilated tortuous gastroduodenal artery probably providing collateral flow into the celiac arterial system. No aneurysm. Left renal:           Single, patent Right renal:          Single, patent Inferior mesenteric:  Patent Left iliac: Ectatic. Aneurysmal common iliac 2.5 cm maximum transverse diameter just above its bifurcation. External and internal iliac arteries are ectatic. Right iliac: Fusiform aneurysmal dilatation of the mid common iliac artery up  to 2.3 cm in diameter through its bifurcation. Ectatic tortuous external and internal iliac artery branches. No dissection or stenosis. Venous findings: Patent renal veins, portal vein, superior mesenteric vein, splenic vein. Review of the MIP images confirms the above findings. Nonvascular findings: Coarse subpleural linear opacities in the visualized lung bases. Multiple hepatic cysts, stable. Unremarkable spleen, adrenal glands, gallbladder. Diffuse fatty infiltration of the pancreas. Continued retraction of the ablation defect in the upper pole left kidney, 13 x 5 mm (previously 15 x 14 mm) with some linear peripheral scarring, without focal enhancing component, developing nodularity, or mass. Small bilateral renal cysts are stable. 3.9 cm exophytic cyst from the lower pole left kidney stable. No hydronephrosis. Stomach, small bowel, and colon are nondilated. Urinary bladder incompletely distended. Moderate prostatic enlargement central coarse calcifications. No ascites. No free air. No adenopathy. Orthopedic fixation hardware across the right femoral neck. Mild lumbar dextroscoliosis with degenerative disc disease at all visualized levels. IMPRESSION: 1. Continued retraction of ablation defect in the upper pole left kidney, no  enhancing residual/recurrent mass or other complicating features. 2. Ectatic tortuous aorta and aneurysmal common iliac arteries as above. Electronically Signed   By: Lucrezia Europe M.D.   On: 02/13/2015 10:40    Labs:  CBC:  Recent Labs  01/03/15 0907  WBC 5.3  HGB 14.6  HCT 42.8  PLT 138*    COAGS:  Recent Labs  11/27/14 1532 12/18/14 1527 01/15/15 1614 01/30/15 1550  INR 3.6 2.9 4.3 3.4    BMP:  Recent Labs  01/03/15 0907  NA 141  K 4.6  CL 103  CO2 30  GLUCOSE 95  BUN 19  CALCIUM 9.0  CREATININE 0.74    LIVER FUNCTION TESTS:  Recent Labs  01/03/15 0907  BILITOT 0.7  AST 23  ALT 13  ALKPHOS 95  PROT 6.7  ALBUMIN 3.9    TUMOR MARKERS: No results for input(s): AFPTM, CEA, CA199, CHROMGRNA in the last 8760 hours.  Assessment and Plan:  CT performed today demonstrates further retraction of a posterior left renal ablation defect after prior cryoablation with no evidence of recurrent or residual enhancing tumor. 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 Unfortunately, the chest was not scanned at the same time to follow-up his known thoracic aortic aneurysm after prior aortic valve and aortic root replacement. Order for CTA of the chest was omitted. I told patient that we will contact Dr. Jacalyn Lefevre office and let them know about this. He will likely require a chest CTA or MRA to follow the residual thoracic aneurysm, which has been stable with yearly imaging. I recommend follow-up abdominal imaging in one year to follow the ablation site. Next year will be 4 years post ablation and it would be unlikely that routine annual CT of the abdomen will be necessary beyond that point should the ablation site be stable given that the original tumor was not malignant.   SignedAletta Edouard T 02/13/2015, 12:06 PM     I spent a total of 15 Minutes in face to face in clinical consultation, greater than 50% of which was  counseling/coordinating care post cryoablation of a left renal oncocytoma.

## 2015-02-26 ENCOUNTER — Other Ambulatory Visit: Payer: Medicare Other

## 2015-02-26 ENCOUNTER — Ambulatory Visit (INDEPENDENT_AMBULATORY_CARE_PROVIDER_SITE_OTHER): Payer: Medicare Other | Admitting: *Deleted

## 2015-02-26 DIAGNOSIS — Z7901 Long term (current) use of anticoagulants: Secondary | ICD-10-CM

## 2015-02-26 LAB — POCT INR: INR: 3.9

## 2015-03-06 ENCOUNTER — Ambulatory Visit
Admission: RE | Admit: 2015-03-06 | Discharge: 2015-03-06 | Disposition: A | Discharge status: Home / Self Care | Payer: Medicare Other | Source: Ambulatory Visit | Attending: Cardiology | Admitting: Cardiology

## 2015-03-06 DIAGNOSIS — I712 Thoracic aortic aneurysm, without rupture, unspecified: Secondary | ICD-10-CM

## 2015-03-06 MED ORDER — IOPAMIDOL (ISOVUE-370) INJECTION 76%
100.0000 mL | Freq: Once | INTRAVENOUS | Status: AC | PRN
  Administered 2015-03-06: 100 mL via INTRAVENOUS

## 2015-03-12 ENCOUNTER — Ambulatory Visit (INDEPENDENT_AMBULATORY_CARE_PROVIDER_SITE_OTHER): Payer: Medicare Other | Admitting: *Deleted

## 2015-03-12 DIAGNOSIS — Z7901 Long term (current) use of anticoagulants: Secondary | ICD-10-CM | POA: Diagnosis not present

## 2015-03-12 LAB — POCT INR: INR: 3.5

## 2015-03-14 IMAGING — CR DG CHEST 2V
2 series · 2 of 2 positions shown · non-contrast
Comparison: CT thorax performed 11/17/2011

CLINICAL DATA: Genitourinary neoplasm and

CHEST - 2 VIEW

[w chest pa]
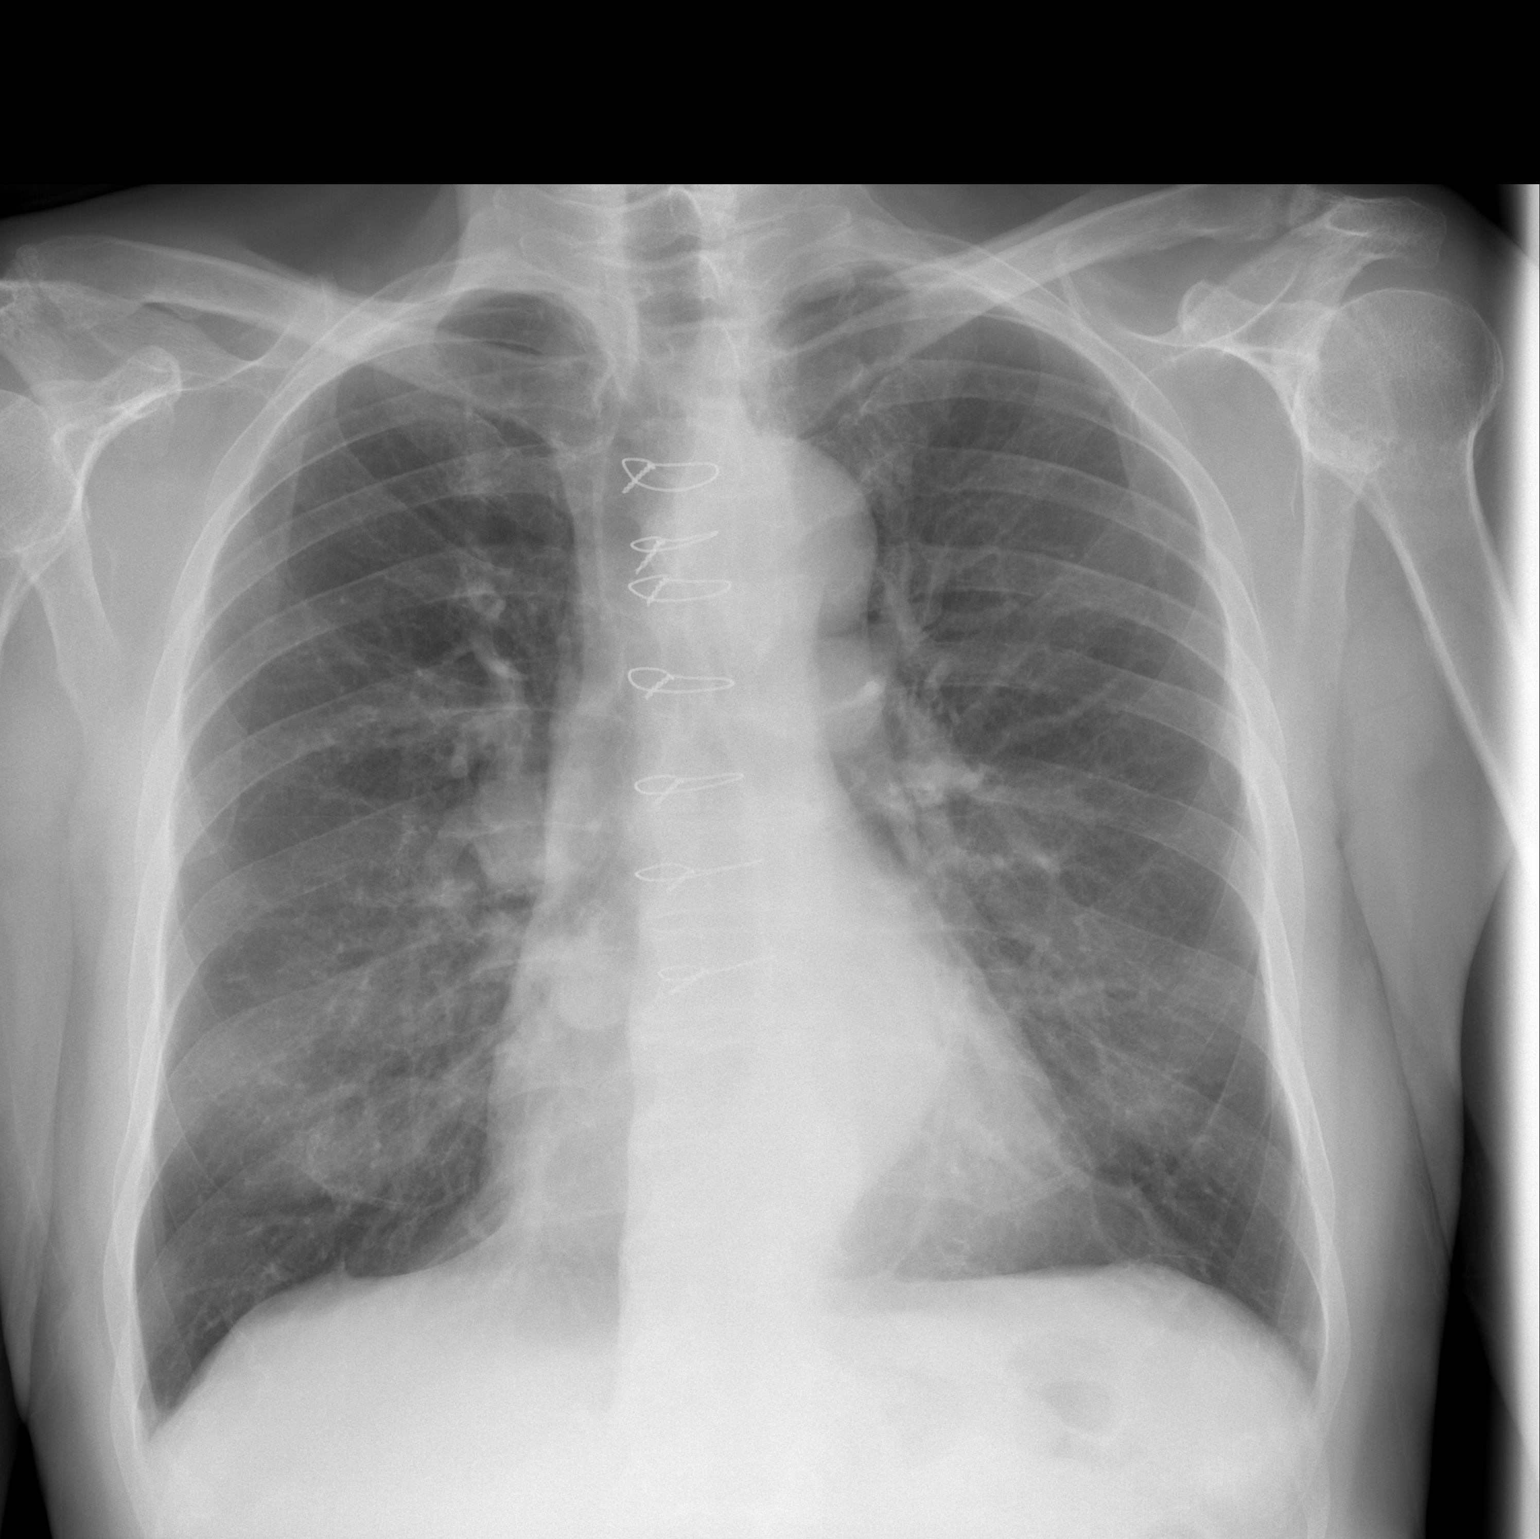

[w chest lat]
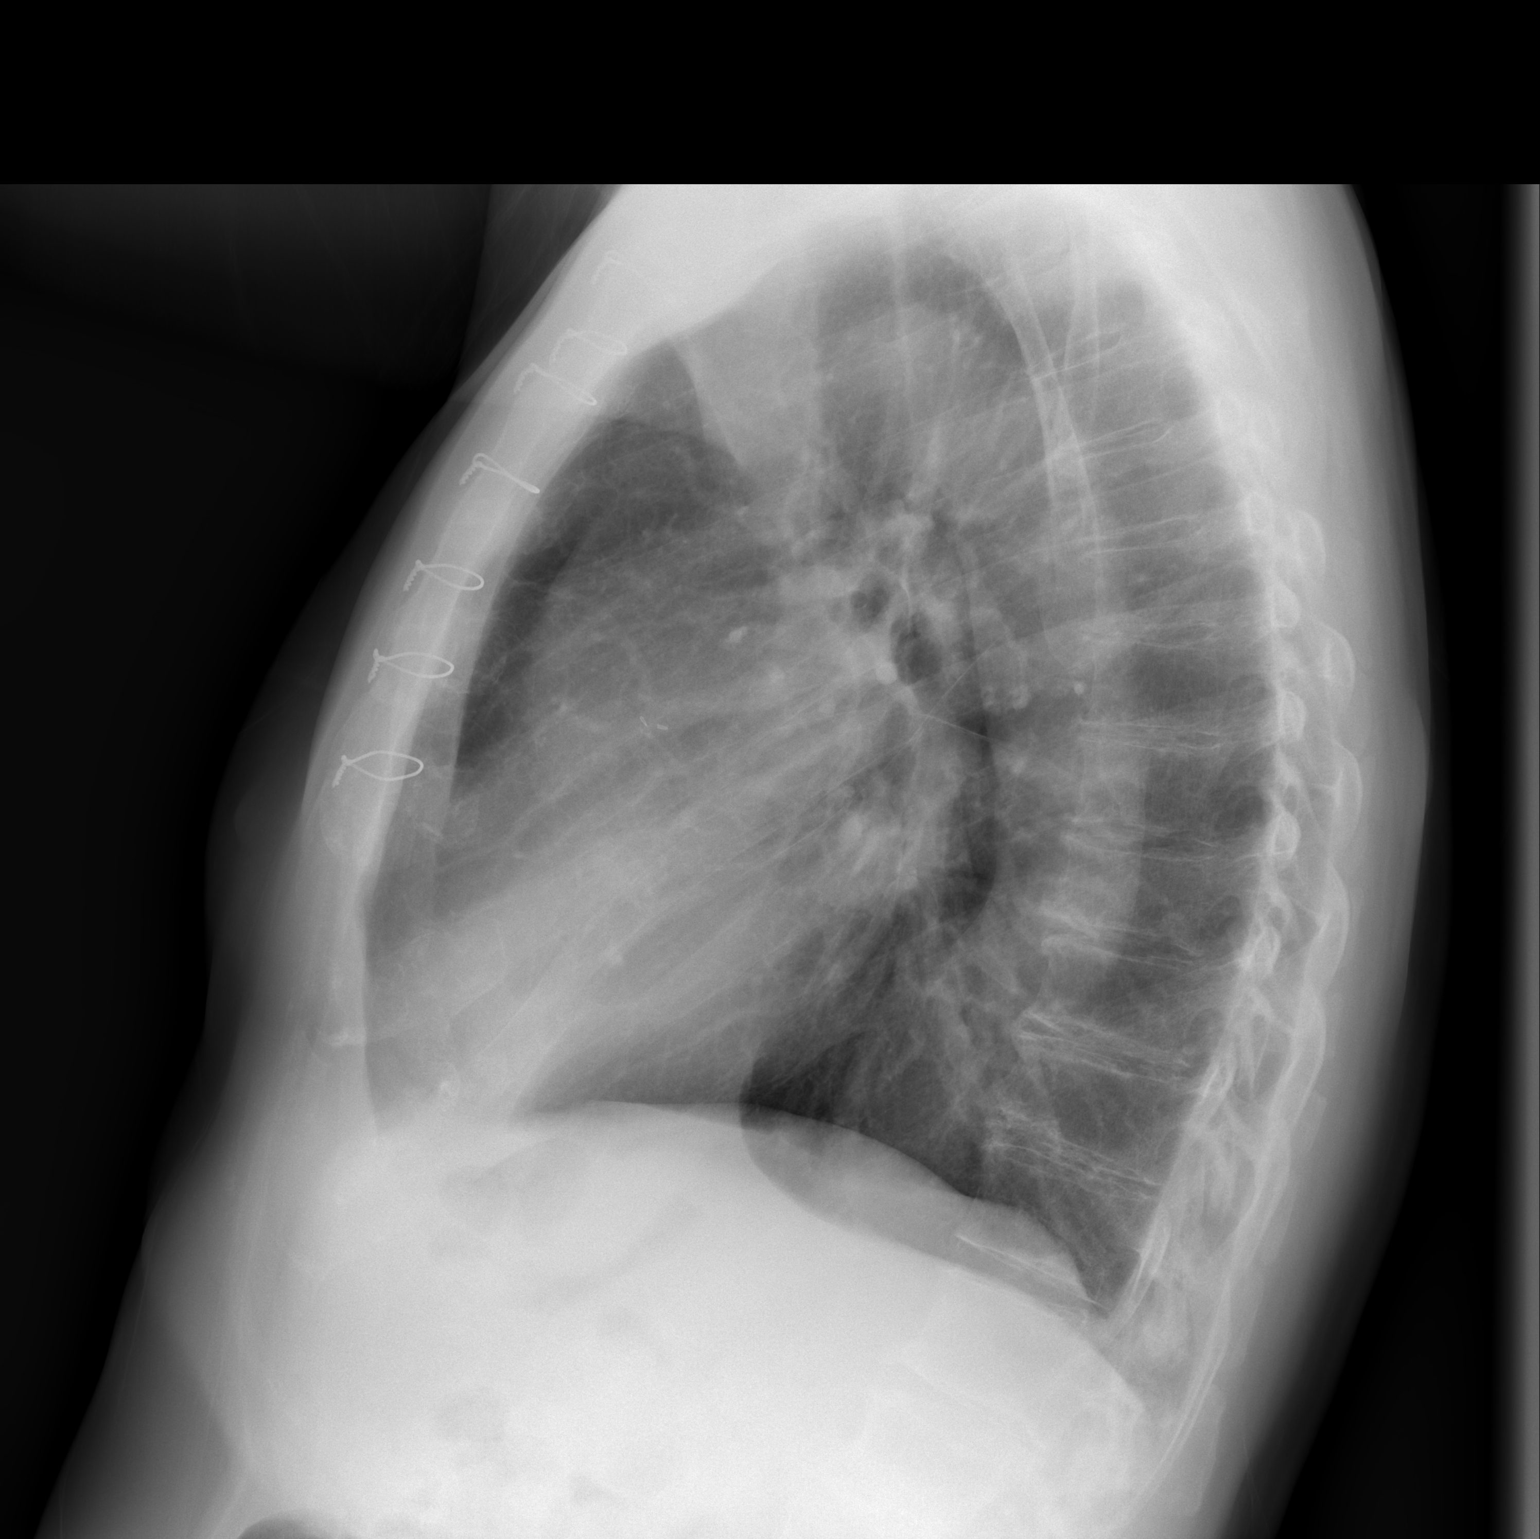

[2 of 2 positions shown; findings below may reference images not displayed]

FINDINGS: The patient is status post median sternotomy.  The heart
size and vascular pattern are normal.  The right hilum appears
prominent on the PA view.  Additionally, 26 mm rounded opacity
projects inferiorly and medially to the right hilum.  The left lung
is clear.
IMPRESSION: Possible pulmonary mass medial right lung base.  CT thorax with
contrast recommended.

## 2015-03-22 ENCOUNTER — Other Ambulatory Visit: Payer: Self-pay | Admitting: Cardiology

## 2015-03-27 ENCOUNTER — Other Ambulatory Visit: Payer: Self-pay | Admitting: *Deleted

## 2015-03-27 MED ORDER — PRAVASTATIN SODIUM 40 MG PO TABS: ORAL_TABLET | ORAL | Status: DC

## 2015-04-09 ENCOUNTER — Ambulatory Visit (INDEPENDENT_AMBULATORY_CARE_PROVIDER_SITE_OTHER): Payer: Medicare Other | Admitting: *Deleted

## 2015-04-09 DIAGNOSIS — Z7901 Long term (current) use of anticoagulants: Secondary | ICD-10-CM | POA: Diagnosis not present

## 2015-04-09 LAB — POCT INR: INR: 4.2

## 2015-04-16 ENCOUNTER — Ambulatory Visit (INDEPENDENT_AMBULATORY_CARE_PROVIDER_SITE_OTHER): Payer: Medicare Other | Admitting: *Deleted

## 2015-04-16 ENCOUNTER — Ambulatory Visit (INDEPENDENT_AMBULATORY_CARE_PROVIDER_SITE_OTHER): Payer: Medicare Other | Admitting: Family Medicine

## 2015-04-16 VITALS — BP 133/73 | HR 58 | Temp 97.8°F | Resp 16 | Ht 75.0 in | Wt 200.0 lb

## 2015-04-16 DIAGNOSIS — R05 Cough: Secondary | ICD-10-CM

## 2015-04-16 DIAGNOSIS — Z7901 Long term (current) use of anticoagulants: Secondary | ICD-10-CM | POA: Diagnosis not present

## 2015-04-16 DIAGNOSIS — R058 Other specified cough: Secondary | ICD-10-CM

## 2015-04-16 LAB — POCT INR: INR: 3.2

## 2015-04-16 MED ORDER — FLUTICASONE PROPIONATE 50 MCG/ACT NA SUSP: 2.0000 | Freq: Every day | NASAL | Status: AC

## 2015-04-16 NOTE — Patient Instructions (Signed)
continue taking the antihistamine and add flonase into the nose once daily. If symptoms don't get better in 1 week, return for care.

## 2015-04-16 NOTE — Assessment & Plan Note (Signed)
Acute, just under 3 weeks. No exam findings or history red flags to indicate CXR, or further work up at this time. Will start therapeutic trial of flonase in addition to po antihistamine (anticholinergic effect from first generation antihistamine might be more beneficial but risk > benefit in this 75 yo gentleman).

## 2015-04-17 NOTE — Progress Notes (Signed)
Subjective: Juan Wilkerson is a 75 y.o. male with a history of Marfan's presenting for cough.  Mr. Stawski reports about 2.5 weeks of stable, somewhat improving cough/throat clearing that occasionally brings up white/yellow mucus, worse in the morning. He had cold-like symptoms preceding the cough and reports continued sensation of post-nasal drip without rhinorrhea. Endorses some congestion but no fevers, trouble breathing, chest pain, hemoptysis, wheezing. He takes loratadine daily and says symptoms were somewhat worse when he missed a few doses.  - Non-smoker  Objective: BP 133/73 mmHg  Pulse 58  Temp(Src) 97.8 F (36.6 C) (Oral)  Resp 16  Ht 6\' 3"  (1.905 m)  Wt 200 lb (90.719 kg)  BMI 25.00 kg/m2  SpO2 98% Gen: Well-appearing, tall, thin 75 y.o. male in no distress HEENT: Normocephalic, sclerae clear, conjunctivae normal, pupils equal and reactive, nares normal with injected turbinates, moist mucous membranes, posterior oropharynx clear Neck: Supple, no thyromegaly or lymphadenopathy CV: Irregularly irregular, no murmur; radial, DP and PT pulses 2+ bilaterally; no LE edema, no JVD, cap refill < 2 sec. Pulm: Non-labored breathing ambient air; CTAB, no wheezes or crackles  Assessment/Plan: Juan Wilkerson is a 75 y.o. male here for acute cough.  Upper airway cough syndrome Acute, just under 3 weeks. No exam findings or history red flags to indicate CXR, or further work up at this time. Will start therapeutic trial of flonase in addition to po antihistamine (anticholinergic effect from first generation antihistamine might be more beneficial but risk > benefit in this 75 yo gentleman).

## 2015-05-07 ENCOUNTER — Ambulatory Visit (INDEPENDENT_AMBULATORY_CARE_PROVIDER_SITE_OTHER): Payer: Medicare Other | Admitting: *Deleted

## 2015-05-07 DIAGNOSIS — Z7901 Long term (current) use of anticoagulants: Secondary | ICD-10-CM | POA: Diagnosis not present

## 2015-05-07 LAB — POCT INR: INR: 4.4

## 2015-05-21 ENCOUNTER — Ambulatory Visit (INDEPENDENT_AMBULATORY_CARE_PROVIDER_SITE_OTHER): Payer: Medicare Other | Admitting: *Deleted

## 2015-05-21 ENCOUNTER — Other Ambulatory Visit: Payer: Self-pay | Admitting: Family Medicine

## 2015-05-21 DIAGNOSIS — Z7901 Long term (current) use of anticoagulants: Secondary | ICD-10-CM

## 2015-05-21 DIAGNOSIS — I4891 Unspecified atrial fibrillation: Secondary | ICD-10-CM

## 2015-05-21 LAB — POCT INR: INR: 3.7

## 2015-05-23 NOTE — Telephone Encounter (Signed)
2nd request from pharmacy for medication refill. Jazmin Hartsell,CMA

## 2015-06-04 ENCOUNTER — Ambulatory Visit (INDEPENDENT_AMBULATORY_CARE_PROVIDER_SITE_OTHER): Payer: Medicare Other | Admitting: *Deleted

## 2015-06-04 DIAGNOSIS — Z7901 Long term (current) use of anticoagulants: Secondary | ICD-10-CM

## 2015-06-04 LAB — POCT INR: INR: 3

## 2015-06-25 ENCOUNTER — Ambulatory Visit (INDEPENDENT_AMBULATORY_CARE_PROVIDER_SITE_OTHER): Payer: Medicare Other | Admitting: *Deleted

## 2015-06-25 DIAGNOSIS — Z7901 Long term (current) use of anticoagulants: Secondary | ICD-10-CM

## 2015-06-25 LAB — POCT INR: INR: 3.7

## 2015-07-09 ENCOUNTER — Ambulatory Visit (INDEPENDENT_AMBULATORY_CARE_PROVIDER_SITE_OTHER): Payer: Medicare Other | Admitting: *Deleted

## 2015-07-09 DIAGNOSIS — Z7901 Long term (current) use of anticoagulants: Secondary | ICD-10-CM | POA: Diagnosis not present

## 2015-07-09 LAB — POCT INR: INR: 3.2

## 2015-08-06 ENCOUNTER — Ambulatory Visit (INDEPENDENT_AMBULATORY_CARE_PROVIDER_SITE_OTHER): Payer: Medicare Other | Admitting: *Deleted

## 2015-08-06 DIAGNOSIS — Z7901 Long term (current) use of anticoagulants: Secondary | ICD-10-CM | POA: Diagnosis not present

## 2015-08-06 LAB — POCT INR: INR: 3

## 2015-09-03 ENCOUNTER — Ambulatory Visit (INDEPENDENT_AMBULATORY_CARE_PROVIDER_SITE_OTHER): Payer: Medicare Other | Admitting: *Deleted

## 2015-09-03 DIAGNOSIS — Z7901 Long term (current) use of anticoagulants: Secondary | ICD-10-CM

## 2015-09-03 LAB — POCT INR: INR: 4.2

## 2015-09-12 ENCOUNTER — Ambulatory Visit (INDEPENDENT_AMBULATORY_CARE_PROVIDER_SITE_OTHER): Payer: Medicare Other | Admitting: Family Medicine

## 2015-09-12 ENCOUNTER — Encounter: Payer: Self-pay | Admitting: Family Medicine

## 2015-09-12 ENCOUNTER — Ambulatory Visit (INDEPENDENT_AMBULATORY_CARE_PROVIDER_SITE_OTHER): Payer: Medicare Other | Admitting: *Deleted

## 2015-09-12 VITALS — BP 119/71 | HR 104 | Temp 97.5°F | Ht 75.0 in | Wt 197.0 lb

## 2015-09-12 DIAGNOSIS — K5900 Constipation, unspecified: Secondary | ICD-10-CM | POA: Diagnosis not present

## 2015-09-12 DIAGNOSIS — Z7901 Long term (current) use of anticoagulants: Secondary | ICD-10-CM

## 2015-09-12 LAB — POCT INR: INR: 2.6

## 2015-09-12 NOTE — Patient Instructions (Signed)
Thank you for coming in today. Today we talked about constipation. Please take over the counter ducolax (generic is docusate) to soften your stool. Please take your Miralax regularly at the same time every day like we discussed.  Return to clinic if there is blood in your stool or if you do not have a bowel movement for approximately 1 week.   Constipation, Adult Constipation is when a person:  Poops (has a bowel movement) less than 3 times a week.  Has a hard time pooping.  Has poop that is dry, hard, or bigger than normal. HOME CARE   Eat foods with a lot of fiber in them. This includes fruits, vegetables, beans, and whole grains such as brown rice.  Avoid fatty foods and foods with a lot of sugar. This includes french fries, hamburgers, cookies, candy, and soda.  If you are not getting enough fiber from food, take products with added fiber in them (supplements).  Drink enough fluid to keep your pee (urine) clear or pale yellow.  Exercise on a regular basis, or as told by your doctor.  Go to the restroom when you feel like you need to poop. Do not hold it.  Only take medicine as told by your doctor. Do not take medicines that help you poop (laxatives) without talking to your doctor first. GET HELP RIGHT AWAY IF:   You have bright red blood in your poop (stool).  Your constipation lasts more than 4 days or gets worse.  You have belly (abdominal) or butt (rectal) pain.  You have thin poop (as thin as a pencil).  You lose weight, and it cannot be explained. MAKE SURE YOU:   Understand these instructions.  Will watch your condition.  Will get help right away if you are not doing well or get worse.   This information is not intended to replace advice given to you by your health care provider. Make sure you discuss any questions you have with your health care provider.   Document Released: 08/12/2007 Document Revised: 03/16/2014 Document Reviewed: 12/05/2012 Elsevier  Interactive Patient Education [employer].

## 2015-09-12 NOTE — Assessment & Plan Note (Addendum)
New problem for patient, has low fiber diet and not using miralax regularly. Patient to try docusate and attempt daily miralax.  Discussed return precautions

## 2015-09-12 NOTE — Progress Notes (Signed)
Subjective:    Patient ID: Juan Wilkerson , male   DOB: [birth year] , 75 y.o..   MRN: [id number]  HPI  Juan Wilkerson is here for constipation.  Patient states has not had a bowel movement in 3 days, has felt urge to pass stool but found himself straining this morning with mild crampy b/l lower abdominal pain. States tried to disimpact himself using his fingers but felt his stool to be hard. Denies blood in stool, n/v.     Review of Systems: Per HPI. All other systems reviewed and are negative.  Health Maintenance Due  Topic Date Due  . COLONOSCOPY  10/15/2013    Past Medical History: Patient Active Problem List   Diagnosis Date Noted  . Upper airway cough syndrome 04/16/2015  . Left renal mass   . Renal oncocytoma of left kidney   . Traumatic ecchymosis of multiple sites 12/03/2014  . Easy bruising 12/03/2014  . Arthritis of knee, left 12/15/2013  . Colon cancer screening 08/28/2013  . Word finding difficulty 08/28/2013  . Microscopic hematuria 11/29/2011  . Long term (current) use of anticoagulants 04/19/2010  . AORTIC VALVE REPLACEMENT, HX OF 01/24/2010  . ATRIAL FLUTTER 07/15/2009  . ESSENTIAL HYPERTENSION, BENIGN 04/29/2009  . Atrial fibrillation ([place]) 04/29/2009  . Aneurysm of thoracic aorta ([place]) 04/29/2009  . Degenerative arthritis of knee, bilateral 04/10/2009  . MARFAN'S SYNDROME 04/18/2007  . HYPERCHOLESTEROLEMIA 07/22/2006  . DEGENERATIVE JOINT DISEASE, BOTH KNEES, SEVERE 07/22/2006    Medications: reviewed and updated Current Outpatient Prescriptions  Medication Sig Dispense Refill  . acetaminophen (TYLENOL) 500 MG tablet Take 1,000 mg by mouth 2 (two) times daily as needed. As needed for pain    . amLODipine (NORVASC) 10 MG tablet Take 1 tablet (10 mg total) by mouth daily. 90 tablet 3  . fluticasone (FLONASE) 50 MCG/ACT nasal spray Place 2 sprays into both nostrils daily. 16 g 0  . Glucosamine-Chondroit-Vit C-Mn (GLUCOSAMINE-CHONDROITIN) TABS Take 2  tablets by mouth every morning.     . hydrocortisone 1 % cream Apply topically. Apply as needed for itching.    . loratadine (CLARITIN) 10 MG tablet Take 10 mg by mouth daily.    . metoprolol succinate (TOPROL-XL) 50 MG 24 hr tablet TAKE 1 TABLET BY MOUTH TWICE A DAY TAKE WITH OR IMMEDIATELY FOLLOWING A MEAL 180 tablet 2  . Multiple Vitamin (MULTIVITAMIN) capsule Take 1 capsule by mouth every morning.     . pravastatin (PRAVACHOL) 40 MG tablet TAKE 1 TABLET BY MOUTH EVERY DAY 90 tablet 3  . tamsulosin (FLOMAX) 0.4 MG CAPS Take 1 capsule (0.4 mg total) by mouth daily. (Patient not taking: Reported on 01/03/2015) 30 capsule 0  . warfarin (COUMADIN) 3 MG tablet TAKE 1 TABLET BY MOUTH EVERY DAY BUT SATURDAY ON SATURDAY TAKE 1 & 1/2 TABLET 45 tablet 8   No current facility-administered medications for this visit.    Social Hx:  reports that he has never smoked. He has never used smokeless tobacco.    Objective:   BP 119/71 mmHg  Pulse 104  Temp(Src) 97.5 F (36.4 C) (Oral)  Ht 6\' 3"  (1.905 m)  Wt 197 lb (89.359 kg)  BMI 24.62 kg/m2 Physical Exam  Gen: NAD, alert, cooperative with exam, well-appearing Cardiac: Irregularly irregular, crisp mechanical sound  Respiratory: Clear to auscultation bilaterally, no wheezes, non-labored Gastrointestinal: soft, non tender, non distended, bowel sounds present Skin: no rashes, normal turgor  Psych: good insight, normal mood and affect  Assessment & Plan:  Constipation New problem for patient, has low fiber diet and not using miralax regularly. Patient to try docusate and attempt daily miralax.  Discussed return precautions

## 2015-10-01 ENCOUNTER — Ambulatory Visit: Payer: Medicare Other

## 2015-10-02 ENCOUNTER — Ambulatory Visit (INDEPENDENT_AMBULATORY_CARE_PROVIDER_SITE_OTHER): Payer: Medicare Other | Admitting: *Deleted

## 2015-10-02 DIAGNOSIS — Z7901 Long term (current) use of anticoagulants: Secondary | ICD-10-CM | POA: Diagnosis not present

## 2015-10-02 LAB — POCT INR: INR: 3.4

## 2015-10-18 ENCOUNTER — Other Ambulatory Visit: Payer: Self-pay | Admitting: Cardiology

## 2015-10-18 NOTE — Telephone Encounter (Signed)
Please call the office to schedule a follow up appointment [phone]

## 2015-10-29 ENCOUNTER — Other Ambulatory Visit (INDEPENDENT_AMBULATORY_CARE_PROVIDER_SITE_OTHER): Payer: Medicare Other | Admitting: *Deleted

## 2015-10-29 DIAGNOSIS — Z7901 Long term (current) use of anticoagulants: Secondary | ICD-10-CM

## 2015-10-29 LAB — POCT INR: INR: 3.6

## 2015-11-12 ENCOUNTER — Ambulatory Visit (INDEPENDENT_AMBULATORY_CARE_PROVIDER_SITE_OTHER): Payer: Medicare Other | Admitting: *Deleted

## 2015-11-12 DIAGNOSIS — Z23 Encounter for immunization: Secondary | ICD-10-CM | POA: Diagnosis not present

## 2015-11-12 DIAGNOSIS — Z7901 Long term (current) use of anticoagulants: Secondary | ICD-10-CM

## 2015-11-12 LAB — POCT INR: INR: 4.9

## 2015-11-19 ENCOUNTER — Ambulatory Visit (INDEPENDENT_AMBULATORY_CARE_PROVIDER_SITE_OTHER): Payer: Medicare Other | Admitting: *Deleted

## 2015-11-19 DIAGNOSIS — Z7901 Long term (current) use of anticoagulants: Secondary | ICD-10-CM

## 2015-11-19 LAB — POCT INR: INR: 1.5

## 2015-11-26 ENCOUNTER — Ambulatory Visit (INDEPENDENT_AMBULATORY_CARE_PROVIDER_SITE_OTHER): Payer: Medicare Other | Admitting: *Deleted

## 2015-11-26 DIAGNOSIS — Z7901 Long term (current) use of anticoagulants: Secondary | ICD-10-CM | POA: Diagnosis not present

## 2015-11-26 LAB — POCT INR: INR: 2.7

## 2015-12-03 ENCOUNTER — Ambulatory Visit (INDEPENDENT_AMBULATORY_CARE_PROVIDER_SITE_OTHER): Payer: Medicare Other | Admitting: *Deleted

## 2015-12-03 DIAGNOSIS — Z7901 Long term (current) use of anticoagulants: Secondary | ICD-10-CM | POA: Diagnosis not present

## 2015-12-03 LAB — POCT INR: INR: 2.8

## 2015-12-17 ENCOUNTER — Ambulatory Visit (INDEPENDENT_AMBULATORY_CARE_PROVIDER_SITE_OTHER): Payer: Medicare Other | Admitting: *Deleted

## 2015-12-17 DIAGNOSIS — Z7901 Long term (current) use of anticoagulants: Secondary | ICD-10-CM

## 2015-12-17 LAB — POCT INR: INR: 2.1

## 2015-12-24 ENCOUNTER — Ambulatory Visit (INDEPENDENT_AMBULATORY_CARE_PROVIDER_SITE_OTHER): Payer: Medicare Other | Admitting: *Deleted

## 2015-12-24 DIAGNOSIS — Z7901 Long term (current) use of anticoagulants: Secondary | ICD-10-CM

## 2015-12-24 LAB — POCT INR: INR: 2.1

## 2016-01-02 ENCOUNTER — Ambulatory Visit (INDEPENDENT_AMBULATORY_CARE_PROVIDER_SITE_OTHER): Payer: Medicare Other | Admitting: *Deleted

## 2016-01-02 DIAGNOSIS — Z7901 Long term (current) use of anticoagulants: Secondary | ICD-10-CM

## 2016-01-02 LAB — POCT INR: INR: 2.5

## 2016-01-07 NOTE — Progress Notes (Signed)
HPI: FU atrial fibrillation/flutter and St Jude mechanical aortic valve replacement. History of Marfan syndrome, status post St. Jude AVR and aortic reconstruction as well as resection of an aortic aneurysm in 1995, permanent atrial fibrillation/flutter, chronic Coumadin therapy. Echo 11/14 showed normal LV function, moderate LVH, mechanical AVR with trace AI, biatrial enlargement, mild RVE and mild MR; mean gradient across AVR 19 mmHg. CTA December 2016 showed stable aneurysmal disease of the thoracic aorta measuring 4.2 cm. Patient's left iliac was aneurysmal at 2.5 cm and right iliac and 2.3 cm. Since last seen, the patient denies any dyspnea on exertion, orthopnea, PND, pedal edema, palpitations, syncope or chest pain.   Current Outpatient Prescriptions  Medication Sig Dispense Refill  . acetaminophen (TYLENOL) 500 MG tablet Take 1,000 mg by mouth 2 (two) times daily as needed. As needed for pain    . amLODipine (NORVASC) 10 MG tablet TAKE 1 TABLET BY MOUTH EVERY DAY 90 tablet 0  . fluticasone (FLONASE) 50 MCG/ACT nasal spray Place 2 sprays into both nostrils daily. 16 g 0  . Glucosamine-Chondroit-Vit C-Mn (GLUCOSAMINE-CHONDROITIN) TABS Take 2 tablets by mouth every morning.     . hydrocortisone 1 % cream Apply topically. Apply as needed for itching.    . loratadine (CLARITIN) 10 MG tablet Take 10 mg by mouth daily.    . metoprolol succinate (TOPROL-XL) 50 MG 24 hr tablet TAKE 1 TABLET BY MOUTH TWICE A DAY TAKE WITH OR IMMEDIATELY FOLLOWING A MEAL 180 tablet 2  . Multiple Vitamin (MULTIVITAMIN) capsule Take 1 capsule by mouth every morning.     . pravastatin (PRAVACHOL) 40 MG tablet TAKE 1 TABLET BY MOUTH EVERY DAY 90 tablet 3  . tamsulosin (FLOMAX) 0.4 MG CAPS Take 1 capsule (0.4 mg total) by mouth daily. (Patient not taking: Reported on 01/03/2015) 30 capsule 0  . warfarin (COUMADIN) 3 MG tablet TAKE 1 TABLET BY MOUTH EVERY DAY BUT SATURDAY ON SATURDAY TAKE 1 & 1/2 TABLET 45 tablet 8     No current facility-administered medications for this visit.      Past Medical History:  Diagnosis Date  . Aortic aneurysm ([place]) 1994   From presumed Marfan's Syndrome.  Followed by Dr Stanford Breed.  Aneurysm involving the proximal descending thoracic aorta .    Marland Kitchen Atrial fibrillation ([place])    Followed by Dr Stanford Breed.  On coumadin.  . Atrial flutter ([place])    Followed by Dr Stanford Breed  . History of blood transfusion   . Hyperlipidemia   . Hypertension   . Marfan's syndrome with aortic dilation   . Osteoarthritis   . Pulmonary nodules    Found on chest CT 08/2007.  Monitoring with yearly CT.  3.34mm nodule in R iddle lobe.    Past Surgical History:  Procedure Laterality Date  . AORTIC VALVE REPLACEMENT  03/1993   St Jude Valve  . CARDIAC VALVE REPLACEMENT    . CORONARY ARTERY BYPASS GRAFT    . HIP FRACTURE SURGERY  03/1992   s/p R hip Fx  . KIDNEY SURGERY    . VASCULAR SURGERY    . Virtual Colonoscopy  11/01/2005   Pt on chronic coumadin for Aortic valve replacement and Atrial fibrilliation therefore at high risk if stopped.  Need to repeat every 5 yrs.     Social History   Social History  . Marital status: Divorced    Spouse name: N/A  . Number of children: 0  . Years of education: 45  Occupational History  . [place] Retired  . 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 Smoking status: Never Smoker  . Smokeless tobacco: Never Used  . Alcohol use No  . Drug use: No  . Sexual activity: No   Other Topics Concern  . Not on file   Social History Narrative   Divorced, lives alone. Has a daughter (pt does not have contact with her, has not seen her since she was 67).    Studied music in college.   Plays oboe with [employer].    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   No smoking. No alcohol use. No recreational drugs.      Health Care POA:    Emergency Contact: Gerald Stabs [phone]   End of Life Plan:  Gave pt the ad pamplet   Who lives with you: Lives by himself   Any pets: 0   Diet: pt diet varies   Exercise: walks and going up and down stairs most days   Seatbelts: pt uses seat belt when in his car   Sun Exposure/Protection: pt uses hats and long sleeves   Hobbies: harmonica                   Family History  Problem Relation Age of Onset  . Cancer Brother   . Hypertension Brother   . Aortic aneurysm Maternal Aunt     ROS: no fevers or chills, productive cough, hemoptysis, dysphasia, odynophagia, melena, hematochezia, dysuria, hematuria, rash, seizure activity, orthopnea, PND, pedal edema, claudication. Remaining systems are negative.  Physical Exam: Well-developed well-nourished in no acute distress.  Skin is warm and dry.  HEENT is normal.  Neck is supple.  Chest is clear to auscultation with normal expansion.  Cardiovascular exam is regular rate and rhythm. Crisp mechanical valve sound Abdominal exam nontender or distended. No masses palpated. Extremities show no edema. neuro grossly intact  ECG-Probable sinus rhythm, left anterior fascicular block, septal infarct..  A/P  1 Hyperlipidemia-continue statin. Check lipids and liver.  2 hypertension-blood pressure controlled. Continue present medications. Check potassium and renal function.  3 atrial flutter-continue beta blocker for rate control. Continue Coumadin. Check hemoglobin.  4 status post aortic valve replacement-continue SBE prophylaxis. Continue coumadin.  5 Thoracic aortic aneurysm/iliac aneurysms-follow-up CTA December 2017.  Kirk Ruths, MD

## 2016-01-13 ENCOUNTER — Encounter: Payer: Self-pay | Admitting: Cardiology

## 2016-01-13 ENCOUNTER — Ambulatory Visit (INDEPENDENT_AMBULATORY_CARE_PROVIDER_SITE_OTHER): Payer: Medicare Other | Admitting: Cardiology

## 2016-01-13 VITALS — BP 138/78 | HR 79 | Ht 75.0 in | Wt 181.0 lb

## 2016-01-13 DIAGNOSIS — E785 Hyperlipidemia, unspecified: Secondary | ICD-10-CM

## 2016-01-13 DIAGNOSIS — I723 Aneurysm of iliac artery: Secondary | ICD-10-CM

## 2016-01-13 DIAGNOSIS — I4891 Unspecified atrial fibrillation: Secondary | ICD-10-CM | POA: Diagnosis not present

## 2016-01-13 DIAGNOSIS — I712 Thoracic aortic aneurysm, without rupture, unspecified: Secondary | ICD-10-CM

## 2016-01-13 LAB — BASIC METABOLIC PANEL
BUN: 20 mg/dL (ref 7–25)
CALCIUM: 9.6 mg/dL (ref 8.6–10.3)
CHLORIDE: 104 mmol/L (ref 98–110)
CO2: 29 mmol/L (ref 20–31)
CREATININE: 0.8 mg/dL (ref 0.70–1.18)
GLUCOSE: 101 mg/dL — AB (ref 65–99)
Potassium: 4.5 mmol/L (ref 3.5–5.3)
SODIUM: 140 mmol/L (ref 135–146)

## 2016-01-13 LAB — CBC
HCT: 46.3 % (ref 38.5–50.0)
HEMOGLOBIN: 15.4 g/dL (ref 13.2–17.1)
MCH: 30.6 pg (ref 27.0–33.0)
MCHC: 33.3 g/dL (ref 32.0–36.0)
MCV: 92 fL (ref 80.0–100.0)
MPV: 10 fL (ref 7.5–12.5)
Platelets: 138 10*3/uL — ABNORMAL LOW (ref 140–400)
RBC: 5.03 MIL/uL (ref 4.20–5.80)
RDW: 13.7 % (ref 11.0–15.0)
WBC: 6.5 10*3/uL (ref 3.8–10.8)

## 2016-01-13 LAB — HEPATIC FUNCTION PANEL
ALT: 11 U/L (ref 9–46)
AST: 22 U/L (ref 10–35)
Albumin: 4.2 g/dL (ref 3.6–5.1)
Alkaline Phosphatase: 84 U/L (ref 40–115)
BILIRUBIN DIRECT: 0.2 mg/dL (ref <=0.2)
BILIRUBIN TOTAL: 1 mg/dL (ref 0.2–1.2)
Indirect Bilirubin: 0.8 mg/dL (ref 0.2–1.2)
Total Protein: 7.4 g/dL (ref 6.1–8.1)

## 2016-01-13 LAB — LIPID PANEL
CHOL/HDL RATIO: 3.8 ratio (ref <5.0)
Cholesterol: 197 mg/dL (ref <200)
HDL: 52 mg/dL (ref >40)
LDL CALC: 113 mg/dL — AB
Triglycerides: 161 mg/dL — ABNORMAL HIGH (ref <150)
VLDL: 32 mg/dL — ABNORMAL HIGH (ref <30)

## 2016-01-13 NOTE — Patient Instructions (Signed)
Medication Instructions:   NO CHANGE  Labwork:  Your physician recommends that you have lab work   Testing/Procedures:  CTA OF THE CHEST TO F/U THORACIC ANEURYSM  CTA OF THE ABDOMIN AND PELVIS TO F/U ILIAC ANEURYSMS  Follow-Up:  Your physician wants you to follow-up in: [place] will receive a reminder letter in the mail two months in advance. If you don't receive a letter, please call our office to schedule the follow-up appointment.   If you need a refill on your cardiac medications before your next appointment, please call your pharmacy.

## 2016-01-14 ENCOUNTER — Encounter: Payer: Self-pay | Admitting: *Deleted

## 2016-01-14 ENCOUNTER — Ambulatory Visit (INDEPENDENT_AMBULATORY_CARE_PROVIDER_SITE_OTHER): Payer: Medicare Other | Admitting: *Deleted

## 2016-01-14 DIAGNOSIS — Z7901 Long term (current) use of anticoagulants: Secondary | ICD-10-CM

## 2016-01-14 LAB — POCT INR: INR: 2.5

## 2016-01-28 ENCOUNTER — Ambulatory Visit (INDEPENDENT_AMBULATORY_CARE_PROVIDER_SITE_OTHER): Payer: Medicare Other | Admitting: *Deleted

## 2016-01-28 DIAGNOSIS — Z7901 Long term (current) use of anticoagulants: Secondary | ICD-10-CM

## 2016-01-28 LAB — POCT INR: INR: 3.1

## 2016-02-10 ENCOUNTER — Other Ambulatory Visit: Payer: Self-pay | Admitting: Cardiology

## 2016-02-11 ENCOUNTER — Ambulatory Visit (INDEPENDENT_AMBULATORY_CARE_PROVIDER_SITE_OTHER): Payer: Medicare Other | Admitting: *Deleted

## 2016-02-11 DIAGNOSIS — Z7901 Long term (current) use of anticoagulants: Secondary | ICD-10-CM

## 2016-02-11 LAB — POCT INR: INR: 3.1

## 2016-03-03 ENCOUNTER — Other Ambulatory Visit: Payer: Self-pay | Admitting: *Deleted

## 2016-03-03 DIAGNOSIS — I712 Thoracic aortic aneurysm, without rupture, unspecified: Secondary | ICD-10-CM

## 2016-03-03 DIAGNOSIS — Z01818 Encounter for other preprocedural examination: Secondary | ICD-10-CM

## 2016-03-03 NOTE — Progress Notes (Unsigned)
plAced labs for ct scan to be done

## 2016-03-04 ENCOUNTER — Other Ambulatory Visit (HOSPITAL_COMMUNITY): Payer: Self-pay | Admitting: Interventional Radiology

## 2016-03-04 ENCOUNTER — Other Ambulatory Visit: Payer: Medicare Other | Admitting: *Deleted

## 2016-03-04 DIAGNOSIS — I712 Thoracic aortic aneurysm, without rupture, unspecified: Secondary | ICD-10-CM

## 2016-03-04 DIAGNOSIS — Z01818 Encounter for other preprocedural examination: Secondary | ICD-10-CM

## 2016-03-04 DIAGNOSIS — N2889 Other specified disorders of kidney and ureter: Secondary | ICD-10-CM

## 2016-03-04 LAB — BUN: BUN: 20 mg/dL (ref 7–25)

## 2016-03-04 LAB — CREATININE, SERUM: Creat: 0.71 mg/dL (ref 0.70–1.18)

## 2016-03-06 ENCOUNTER — Ambulatory Visit (INDEPENDENT_AMBULATORY_CARE_PROVIDER_SITE_OTHER)
Admission: RE | Admit: 2016-03-06 | Discharge: 2016-03-06 | Disposition: A | Discharge status: Home / Self Care | Payer: Medicare Other | Source: Ambulatory Visit | Attending: Cardiology | Admitting: Cardiology

## 2016-03-06 DIAGNOSIS — I712 Thoracic aortic aneurysm, without rupture, unspecified: Secondary | ICD-10-CM

## 2016-03-06 DIAGNOSIS — I723 Aneurysm of iliac artery: Secondary | ICD-10-CM | POA: Diagnosis not present

## 2016-03-06 MED ORDER — IOPAMIDOL (ISOVUE-370) INJECTION 76%
100.0000 mL | Freq: Once | INTRAVENOUS | Status: AC | PRN
  Administered 2016-03-06: 100 mL via INTRAVENOUS

## 2016-03-12 ENCOUNTER — Ambulatory Visit (INDEPENDENT_AMBULATORY_CARE_PROVIDER_SITE_OTHER): Payer: Medicare Other | Admitting: *Deleted

## 2016-03-12 DIAGNOSIS — Z7901 Long term (current) use of anticoagulants: Secondary | ICD-10-CM

## 2016-03-12 LAB — POCT INR: INR: 3

## 2016-04-01 ENCOUNTER — Ambulatory Visit
Admission: RE | Admit: 2016-04-01 | Discharge: 2016-04-01 | Disposition: A | Discharge status: Home / Self Care | Payer: Medicare Other | Source: Ambulatory Visit | Attending: Interventional Radiology | Admitting: Interventional Radiology

## 2016-04-01 ENCOUNTER — Encounter: Payer: Self-pay | Admitting: Radiology

## 2016-04-01 DIAGNOSIS — N2889 Other specified disorders of kidney and ureter: Secondary | ICD-10-CM

## 2016-04-01 HISTORY — PX: IR GENERIC HISTORICAL: IMG1180011

## 2016-04-01 NOTE — Progress Notes (Signed)
Chief Complaint: Patient was seen in consultation today for  Chief Complaint  Patient presents with  . Follow-up    4 yr follow up Cryoablation of Left Renal Mass    Referring Physician(s): Dr. Alinda Money  Supervising Physician: Aletta Edouard  History of Present Illness: Juan Wilkerson is a 76 y.o. male with history of Renal Mass'  His initial consultation was on 01/01/2012 by Dr. Kathlene Cote.  He underwent CT guided cryoablation of the left renal mass on [id number] which was complicated by retroperitoneal hematoma.  He is here today for routine follow up.  He has Marfan's syndrome and his cardiologist follows is heart and aorta with routine CTA. He asked if he could have his renal follow up CT at the same time to reduce contrast.  CT reveals stable left upper pole renal ablation defect without evidence of residual/recurrent neoplasm.  He is doing well and has no complaints.  Past Medical History:  Diagnosis Date  . Aortic aneurysm ([place]) 1994   From presumed Marfan's Syndrome.  Followed by Dr Stanford Breed.  Aneurysm involving the proximal descending thoracic aorta .    Marland Kitchen Atrial fibrillation ([place])    Followed by Dr Stanford Breed.  On coumadin.  . Atrial flutter ([place])    Followed by Dr Stanford Breed  . History of blood transfusion   . Hyperlipidemia   . Hypertension   . Marfan's syndrome with aortic dilation   . Osteoarthritis   . Pulmonary nodules    Found on chest CT 08/2007.  Monitoring with yearly CT.  3.26mm nodule in R iddle lobe.    Past Surgical History:  Procedure Laterality Date  . AORTIC VALVE REPLACEMENT  03/1993   St Jude Valve  . CARDIAC VALVE REPLACEMENT    . CORONARY ARTERY BYPASS GRAFT    . HIP FRACTURE SURGERY  03/1992   s/p R hip Fx  . KIDNEY SURGERY    . VASCULAR SURGERY    . Virtual Colonoscopy  11/01/2005   Pt on chronic coumadin for Aortic valve replacement and Atrial fibrilliation therefore at high risk if stopped.  Need to repeat every 5 yrs.      Allergies: Lemon oil  Medications: Prior to Admission medications   Medication Sig Start Date End Date Taking? Authorizing Provider  acetaminophen (TYLENOL) 500 MG tablet Take 1,000 mg by mouth 2 (two) times daily as needed. As needed for pain   Yes Historical Provider, MD  amLODipine (NORVASC) 10 MG tablet TAKE 1 TABLET BY MOUTH EVERY DAY 10/18/15  Yes Lelon Perla, MD  Glucosamine-Chondroit-Vit C-Mn (GLUCOSAMINE-CHONDROITIN) TABS Take 2 tablets by mouth every morning.    Yes Historical Provider, MD  hydrocortisone 1 % cream Apply topically. Apply as needed for itching.   Yes Historical Provider, MD  loratadine (CLARITIN) 10 MG tablet Take 10 mg by mouth daily.   Yes Historical Provider, MD  metoprolol succinate (TOPROL-XL) 50 MG 24 hr tablet TAKE 1 TABLET BY MOUTH TWICE A DAY TAKE WITH OR IMMEDIATELY FOLLOWING A MEAL 02/10/16  Yes Lelon Perla, MD  Multiple Vitamin (MULTIVITAMIN) capsule Take 1 capsule by mouth every morning.    Yes Historical Provider, MD  pravastatin (PRAVACHOL) 40 MG tablet TAKE 1 TABLET BY MOUTH EVERY DAY 03/27/15  Yes Lelon Perla, MD  warfarin (COUMADIN) 3 MG tablet TAKE 1 TABLET BY MOUTH EVERY DAY BUT SATURDAY ON SATURDAY TAKE 1 & 1/2 TABLET 05/24/15  Yes Frazier Richards, MD  fluticasone (FLONASE) 50 MCG/ACT nasal spray Place  2 sprays into both nostrils daily. Patient not taking: Reported on 04/01/2016 04/16/15   Patrecia Pour, MD  tamsulosin (FLOMAX) 0.4 MG CAPS Take 1 capsule (0.4 mg total) by mouth daily. Patient not taking: Reported on 04/01/2016 09/29/12   Frazier Richards, MD     Family History  Problem Relation Age of Onset  . Cancer Brother   . Hypertension Brother   . Aortic aneurysm Maternal Aunt     Social History   Social History  . Marital status: Divorced    Spouse name: N/A  . Number of children: 0  . Years of education: 18   Occupational History  . [employer] Retired  . 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  Smoking status: Never Smoker  . Smokeless tobacco: Never Used  . Alcohol use No  . Drug use: No  . Sexual activity: No   Other Topics Concern  . Not on file   Social History Narrative   Divorced, lives alone. Has a daughter (pt does not have contact with her, has not seen her since she was 41).    Studied music in college.   Plays oboe with [employer].    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   No smoking. No alcohol use. No recreational drugs.      Health Care POA:    Emergency Contact: Gerald Stabs [phone]   End of Life Plan: Gave pt the ad pamplet   Who lives with you: Lives by himself   Any pets: 0   Diet: pt diet varies   Exercise: walks and going up and down stairs most days   Seatbelts: pt uses seat belt when in his car   Sun Exposure/Protection: pt uses hats and long sleeves   Hobbies: harmonica                   Review of Systems: A 12 point ROS discussed  Review of Systems  Constitutional: Negative.   Respiratory: Negative.   Cardiovascular: Negative.   Gastrointestinal: Negative.   Genitourinary: Negative.   Musculoskeletal: Negative.   Skin: Negative.   Neurological: Negative.   Hematological: Negative.   Psychiatric/Behavioral: Negative.     Vital Signs: BP (!) 132/91 (BP Location: Right Arm, Patient Position: Sitting, Cuff Size: Normal)   Pulse 84   Temp 98.1 F (36.7 C) (Oral)   Resp 15   Ht 6\' 4"  (1.93 m)   Wt 192 lb 6.4 oz (87.3 kg)   SpO2 99%   BMI 23.42 kg/m   Physical Exam  Constitutional: He is oriented to person, place, and time. He appears well-developed and well-nourished.  HENT:  Head: Normocephalic and atraumatic.  Eyes: EOM are normal.  Neck: Normal range of motion.  Cardiovascular: Normal rate, regular rhythm and normal heart sounds.   Pulmonary/Chest: Effort normal and breath sounds normal. No respiratory distress. He has no wheezes.  Abdominal: Soft.  Neurological: He is  alert and oriented to person, place, and time.  Skin: Skin is warm and dry.  Psychiatric: He has a normal mood and affect. His behavior is normal. Judgment and thought content normal.  Vitals reviewed.    Imaging: Ct Angio Chest Aorta W &/or Wo Contrast  Result Date: 03/06/2016 CLINICAL DATA:  Marfan's syndrome with previous aortic valve and root replacement and residual aneurysmal disease of thoracic aorta, hx iliac aneurysms; f/u scan - no complaints today EXAM:  CT ANGIOGRAPHY CHEST, ABDOMEN AND PELVIS TECHNIQUE: Multidetector CT imaging through the chest, abdomen and pelvis was performed using the standard protocol during bolus administration of intravenous contrast. Multiplanar reconstructed images and MIPs were obtained and reviewed to evaluate the vascular anatomy. CONTRAST:  100cc isovue 370 COMPARISON:  03/06/2015 and previous FINDINGS: CTA CHEST FINDINGS Cardiovascular: Left arm IV contrast administration. Duplicated SVC, the left draining into the coronary sinus, an anatomic variant. Satisfactory opacification of pulmonary arteries noted, and there is no evidence of pulmonary emboli. Patent bilateral pulmonary veins drain into the left atrium. Previous AVR. Previous tube graft repair of the ascending aorta. Adequate contrast opacification of the thoracic aorta with no evidence of dissection or stenosis. There is classic 3-vessel brachiocephalic arch anatomy without proximal stenosis. The proximal aortic arch measures 3.8 cm diameter, distal arch 3.2 cm, proximal descending segment 4.2 cm (previously 4.2 cm), tapering distally to 3.5 cm above the diaphragm. Mediastinum/Nodes: No mediastinal hematoma. No pericardial effusion. No hilar or mediastinal adenopathy. Insert Lungs/Pleura: No pleural effusion. No pneumothorax. Calcified subpleural nodule, posterior left lower lobe image 80/6. Musculoskeletal: Severe left and mild right glenohumeral DJD. Anterior vertebral endplate spurring at multiple  levels in the mid and lower thoracic spine. Previous median sternotomy. Review of the MIP images confirms the above findings. CTA ABDOMEN AND PELVIS FINDINGS VASCULAR Aorta: 3 cm maximum transverse diameter at the level of the SMA origin. Tortuous distally. No dissection or stenosis. Celiac: Short-segment origin stenosis, dilated distally up to 15 mm diameter, stable by my measurement. Distal branches unremarkable. SMA: Patent.  Ectatic tortuous proximal branches as before. Renals: Both renal arteries are patent without evidence of aneurysm, dissection, vasculitis, fibromuscular dysplasia or significant stenosis. IMA: Patent without evidence of aneurysm, dissection, vasculitis or significant stenosis. Inflow: Proximal left common iliac artery has stable fusiform dilatation up to 2.5 cm diameter just proximal to its bifurcation, with Ectatic internal and external branches. There is stable fusiform dilatation of the right common iliac artery up to 2.2 cm diameter, with ectatic internal and external branches. Veins: No dedicated venous phase imaging obtained. Review of the MIP images confirms the above findings. NON-VASCULAR Hepatobiliary: Stable scattered hepatic cysts. No new liver lesion. Gallbladder nondilated. Pancreas: Diffuse atrophy without focal mass or ductal dilatation. Spleen: Normal in size without focal abnormality. Adrenals/Urinary Tract: Adrenal glands unremarkable. Ablation defect in the upper pole left kidney stable. No new enhancing mass. Bilateral renal cysts, stable. No hydronephrosis. Stomach/Bowel: Stomach, small bowel, and colon are nondilated. Appendix not discretely identified. Lymphatic: No adenopathy localized. Reproductive: Prostatic enlargement with central coarse calcifications. Other: No ascites.  No free air. Musculoskeletal: Orthopedic pins across the right femoral neck. Spondylitic changes in the lumbar spine most marked L2-3 and L5-S1. Negative for fracture or other acute finding.  Review of the MIP images confirms the above findings. IMPRESSION: 1. Stable appearance of AVR and ascending aortic repair. 2. 4.2 cm thoracic aortic aneurysm, stable without complicating features. 3. Stable ectasia of abdominal aorta and common iliac arteries as above. 4. Stable left upper pole renal ablation defect without evidence of residual/recurrent neoplasm. Electronically Signed   By: Lucrezia Europe M.D.   On: 03/06/2016 11:12   Ct Angio Abd/pel W/ And/or W/o  Result Date: 03/06/2016 CLINICAL DATA:  Marfan's syndrome with previous aortic valve and root replacement and residual aneurysmal disease of thoracic aorta, hx iliac aneurysms; f/u scan - no complaints today EXAM: CT ANGIOGRAPHY CHEST, ABDOMEN AND PELVIS TECHNIQUE: Multidetector CT imaging through the chest, abdomen and pelvis  was performed using the standard protocol during bolus administration of intravenous contrast. Multiplanar reconstructed images and MIPs were obtained and reviewed to evaluate the vascular anatomy. CONTRAST:  100cc isovue 370 COMPARISON:  03/06/2015 and previous FINDINGS: CTA CHEST FINDINGS Cardiovascular: Left arm IV contrast administration. Duplicated SVC, the left draining into the coronary sinus, an anatomic variant. Satisfactory opacification of pulmonary arteries noted, and there is no evidence of pulmonary emboli. Patent bilateral pulmonary veins drain into the left atrium. Previous AVR. Previous tube graft repair of the ascending aorta. Adequate contrast opacification of the thoracic aorta with no evidence of dissection or stenosis. There is classic 3-vessel brachiocephalic arch anatomy without proximal stenosis. The proximal aortic arch measures 3.8 cm diameter, distal arch 3.2 cm, proximal descending segment 4.2 cm (previously 4.2 cm), tapering distally to 3.5 cm above the diaphragm. Mediastinum/Nodes: No mediastinal hematoma. No pericardial effusion. No hilar or mediastinal adenopathy. Insert Lungs/Pleura: No pleural  effusion. No pneumothorax. Calcified subpleural nodule, posterior left lower lobe image 80/6. Musculoskeletal: Severe left and mild right glenohumeral DJD. Anterior vertebral endplate spurring at multiple levels in the mid and lower thoracic spine. Previous median sternotomy. Review of the MIP images confirms the above findings. CTA ABDOMEN AND PELVIS FINDINGS VASCULAR Aorta: 3 cm maximum transverse diameter at the level of the SMA origin. Tortuous distally. No dissection or stenosis. Celiac: Short-segment origin stenosis, dilated distally up to 15 mm diameter, stable by my measurement. Distal branches unremarkable. SMA: Patent.  Ectatic tortuous proximal branches as before. Renals: Both renal arteries are patent without evidence of aneurysm, dissection, vasculitis, fibromuscular dysplasia or significant stenosis. IMA: Patent without evidence of aneurysm, dissection, vasculitis or significant stenosis. Inflow: Proximal left common iliac artery has stable fusiform dilatation up to 2.5 cm diameter just proximal to its bifurcation, with Ectatic internal and external branches. There is stable fusiform dilatation of the right common iliac artery up to 2.2 cm diameter, with ectatic internal and external branches. Veins: No dedicated venous phase imaging obtained. Review of the MIP images confirms the above findings. NON-VASCULAR Hepatobiliary: Stable scattered hepatic cysts. No new liver lesion. Gallbladder nondilated. Pancreas: Diffuse atrophy without focal mass or ductal dilatation. Spleen: Normal in size without focal abnormality. Adrenals/Urinary Tract: Adrenal glands unremarkable. Ablation defect in the upper pole left kidney stable. No new enhancing mass. Bilateral renal cysts, stable. No hydronephrosis. Stomach/Bowel: Stomach, small bowel, and colon are nondilated. Appendix not discretely identified. Lymphatic: No adenopathy localized. Reproductive: Prostatic enlargement with central coarse calcifications. Other:  No ascites.  No free air. Musculoskeletal: Orthopedic pins across the right femoral neck. Spondylitic changes in the lumbar spine most marked L2-3 and L5-S1. Negative for fracture or other acute finding. Review of the MIP images confirms the above findings. IMPRESSION: 1. Stable appearance of AVR and ascending aortic repair. 2. 4.2 cm thoracic aortic aneurysm, stable without complicating features. 3. Stable ectasia of abdominal aorta and common iliac arteries as above. 4. Stable left upper pole renal ablation defect without evidence of residual/recurrent neoplasm. Electronically Signed   By: Lucrezia Europe M.D.   On: 03/06/2016 11:12    Labs:  CBC:  Recent Labs  01/13/16 1231  WBC 6.5  HGB 15.4  HCT 46.3  PLT 138*    COAGS:  Recent Labs  01/14/16 1440 01/28/16 1414 02/11/16 1547 03/12/16 1350  INR 2.5 3.1 3.1 3.0    BMP:  Recent Labs  01/13/16 1231 03/04/16 0948  NA 140  --   K 4.5  --   CL 104  --  CO2 29  --   GLUCOSE 101*  --   BUN 20 20  CALCIUM 9.6  --   CREATININE 0.80 0.71    LIVER FUNCTION TESTS:  Recent Labs  01/13/16 1231  BILITOT 1.0  AST 22  ALT 11  ALKPHOS 84  PROT 7.4  ALBUMIN 4.2    TUMOR MARKERS: No results for input(s): AFPTM, CEA, CA199, CHROMGRNA in the last 8760 hours.  Assessment:  History of Left renal cell cancer  S/P cryoablation back in 2013.  CT without evidence of recurrence   Recommend repeat CT in 1 year.   Would obtain multiphase CT at that time to adequately evaluate the cryo site.   If still without recurrence, can likely stop imaging follow ups of the cryo site.   Electronically Signed: Murrell Redden PA-C 04/01/2016, 3:47 PM   Please refer to Dr. Margaretmary Dys attestation of this note for management and plan.

## 2016-04-04 ENCOUNTER — Other Ambulatory Visit: Payer: Self-pay | Admitting: Cardiology

## 2016-04-07 ENCOUNTER — Ambulatory Visit (INDEPENDENT_AMBULATORY_CARE_PROVIDER_SITE_OTHER): Payer: Medicare Other | Admitting: *Deleted

## 2016-04-07 DIAGNOSIS — Z7901 Long term (current) use of anticoagulants: Secondary | ICD-10-CM

## 2016-04-07 LAB — POCT INR: INR: 2.4

## 2016-04-12 ENCOUNTER — Other Ambulatory Visit: Payer: Self-pay | Admitting: Cardiology

## 2016-04-21 ENCOUNTER — Ambulatory Visit (INDEPENDENT_AMBULATORY_CARE_PROVIDER_SITE_OTHER): Payer: Medicare Other | Admitting: *Deleted

## 2016-04-21 DIAGNOSIS — Z7901 Long term (current) use of anticoagulants: Secondary | ICD-10-CM

## 2016-04-22 LAB — POCT INR: INR: 1.8

## 2016-04-28 ENCOUNTER — Ambulatory Visit (INDEPENDENT_AMBULATORY_CARE_PROVIDER_SITE_OTHER): Payer: Medicare Other | Admitting: *Deleted

## 2016-04-28 DIAGNOSIS — Z7901 Long term (current) use of anticoagulants: Secondary | ICD-10-CM | POA: Diagnosis not present

## 2016-04-28 LAB — POCT INR: INR: 3.4

## 2016-05-12 ENCOUNTER — Ambulatory Visit (INDEPENDENT_AMBULATORY_CARE_PROVIDER_SITE_OTHER): Payer: Medicare Other | Admitting: *Deleted

## 2016-05-12 DIAGNOSIS — Z7901 Long term (current) use of anticoagulants: Secondary | ICD-10-CM | POA: Diagnosis not present

## 2016-05-12 LAB — POCT INR: INR: 4.4

## 2016-05-26 ENCOUNTER — Ambulatory Visit (INDEPENDENT_AMBULATORY_CARE_PROVIDER_SITE_OTHER): Payer: Medicare Other | Admitting: *Deleted

## 2016-05-26 DIAGNOSIS — Z7901 Long term (current) use of anticoagulants: Secondary | ICD-10-CM

## 2016-05-26 LAB — POCT INR: INR: 2.8

## 2016-06-09 ENCOUNTER — Ambulatory Visit (INDEPENDENT_AMBULATORY_CARE_PROVIDER_SITE_OTHER): Payer: Medicare Other | Admitting: *Deleted

## 2016-06-09 DIAGNOSIS — Z7901 Long term (current) use of anticoagulants: Secondary | ICD-10-CM | POA: Diagnosis not present

## 2016-06-09 LAB — POCT INR: INR: 2.9

## 2016-06-15 ENCOUNTER — Other Ambulatory Visit: Payer: Self-pay | Admitting: *Deleted

## 2016-06-15 DIAGNOSIS — I4891 Unspecified atrial fibrillation: Secondary | ICD-10-CM

## 2016-06-15 MED ORDER — WARFARIN SODIUM 3 MG PO TABS: ORAL_TABLET | ORAL | 8 refills | Status: AC

## 2016-06-30 ENCOUNTER — Ambulatory Visit: Payer: Medicare Other

## 2016-07-01 ENCOUNTER — Ambulatory Visit (INDEPENDENT_AMBULATORY_CARE_PROVIDER_SITE_OTHER): Payer: Medicare Other | Admitting: *Deleted

## 2016-07-01 DIAGNOSIS — Z7901 Long term (current) use of anticoagulants: Secondary | ICD-10-CM | POA: Diagnosis not present

## 2016-07-01 LAB — POCT INR: INR: 3.4

## 2016-07-07 ENCOUNTER — Other Ambulatory Visit: Payer: Self-pay | Admitting: Cardiology

## 2016-07-07 ENCOUNTER — Ambulatory Visit: Payer: Medicare Other

## 2016-07-07 NOTE — Telephone Encounter (Signed)
Rx request sent to pharmacy.  

## 2016-08-06 ENCOUNTER — Ambulatory Visit (INDEPENDENT_AMBULATORY_CARE_PROVIDER_SITE_OTHER): Payer: Medicare Other | Admitting: *Deleted

## 2016-08-06 ENCOUNTER — Other Ambulatory Visit: Payer: Self-pay | Admitting: Pharmacist

## 2016-08-06 DIAGNOSIS — I4891 Unspecified atrial fibrillation: Secondary | ICD-10-CM

## 2016-08-06 DIAGNOSIS — Z7901 Long term (current) use of anticoagulants: Secondary | ICD-10-CM | POA: Diagnosis not present

## 2016-08-06 LAB — POCT INR: INR: 2.8

## 2016-08-25 ENCOUNTER — Ambulatory Visit (INDEPENDENT_AMBULATORY_CARE_PROVIDER_SITE_OTHER): Payer: Medicare Other | Admitting: *Deleted

## 2016-08-25 DIAGNOSIS — Z7901 Long term (current) use of anticoagulants: Secondary | ICD-10-CM | POA: Diagnosis not present

## 2016-08-25 LAB — POCT INR: INR: 2.3

## 2016-09-01 ENCOUNTER — Ambulatory Visit: Payer: Medicare Other

## 2016-09-08 ENCOUNTER — Ambulatory Visit: Payer: Medicare Other

## 2016-09-15 ENCOUNTER — Ambulatory Visit (INDEPENDENT_AMBULATORY_CARE_PROVIDER_SITE_OTHER): Payer: Medicare Other | Admitting: *Deleted

## 2016-09-15 DIAGNOSIS — I4891 Unspecified atrial fibrillation: Secondary | ICD-10-CM | POA: Diagnosis not present

## 2016-09-15 DIAGNOSIS — Z7901 Long term (current) use of anticoagulants: Secondary | ICD-10-CM

## 2016-09-15 LAB — POCT HEMOGLOBIN: HEMOGLOBIN: 13.8 g/dL — AB (ref 14.1–18.1)

## 2016-09-15 LAB — POCT INR: INR: 2.1

## 2016-09-29 ENCOUNTER — Ambulatory Visit: Payer: Medicare Other

## 2016-10-01 ENCOUNTER — Ambulatory Visit (INDEPENDENT_AMBULATORY_CARE_PROVIDER_SITE_OTHER): Payer: Medicare Other | Admitting: *Deleted

## 2016-10-01 DIAGNOSIS — Z7901 Long term (current) use of anticoagulants: Secondary | ICD-10-CM | POA: Diagnosis not present

## 2016-10-01 LAB — POCT INR: INR: 3.6

## 2016-10-13 ENCOUNTER — Ambulatory Visit (INDEPENDENT_AMBULATORY_CARE_PROVIDER_SITE_OTHER): Payer: Medicare Other | Admitting: *Deleted

## 2016-10-13 DIAGNOSIS — Z7901 Long term (current) use of anticoagulants: Secondary | ICD-10-CM | POA: Diagnosis not present

## 2016-10-13 LAB — POCT INR: INR: 2

## 2016-10-15 ENCOUNTER — Ambulatory Visit: Payer: Medicare Other

## 2016-10-20 ENCOUNTER — Ambulatory Visit: Payer: Medicare Other

## 2016-11-11 ENCOUNTER — Inpatient Hospital Stay (HOSPITAL_COMMUNITY)
Admission: EM | Admit: 2016-11-11 | Discharge: 2016-12-02 | DRG: 056 | Disposition: A | Discharge status: Skilled Nursing Facility | Payer: Medicare Other | Attending: Family Medicine | Admitting: Family Medicine

## 2016-11-11 ENCOUNTER — Telehealth: Payer: Self-pay | Admitting: Cardiology

## 2016-11-11 ENCOUNTER — Emergency Department (HOSPITAL_COMMUNITY): Payer: Medicare Other

## 2016-11-11 ENCOUNTER — Encounter (HOSPITAL_COMMUNITY): Payer: Self-pay | Admitting: Emergency Medicine

## 2016-11-11 DIAGNOSIS — B961 Klebsiella pneumoniae [K. pneumoniae] as the cause of diseases classified elsewhere: Secondary | ICD-10-CM | POA: Diagnosis present

## 2016-11-11 DIAGNOSIS — I4891 Unspecified atrial fibrillation: Secondary | ICD-10-CM | POA: Diagnosis present

## 2016-11-11 DIAGNOSIS — Z952 Presence of prosthetic heart valve: Secondary | ICD-10-CM

## 2016-11-11 DIAGNOSIS — L8990 Pressure ulcer of unspecified site, unspecified stage: Secondary | ICD-10-CM | POA: Diagnosis present

## 2016-11-11 DIAGNOSIS — G3183 Dementia with Lewy bodies: Principal | ICD-10-CM | POA: Diagnosis present

## 2016-11-11 DIAGNOSIS — E860 Dehydration: Secondary | ICD-10-CM | POA: Diagnosis present

## 2016-11-11 DIAGNOSIS — Z9181 History of falling: Secondary | ICD-10-CM

## 2016-11-11 DIAGNOSIS — R338 Other retention of urine: Secondary | ICD-10-CM | POA: Diagnosis present

## 2016-11-11 DIAGNOSIS — Z7901 Long term (current) use of anticoagulants: Secondary | ICD-10-CM

## 2016-11-11 DIAGNOSIS — M25512 Pain in left shoulder: Secondary | ICD-10-CM | POA: Diagnosis present

## 2016-11-11 DIAGNOSIS — R440 Auditory hallucinations: Secondary | ICD-10-CM | POA: Diagnosis present

## 2016-11-11 DIAGNOSIS — D696 Thrombocytopenia, unspecified: Secondary | ICD-10-CM | POA: Diagnosis present

## 2016-11-11 DIAGNOSIS — R3129 Other microscopic hematuria: Secondary | ICD-10-CM | POA: Diagnosis present

## 2016-11-11 DIAGNOSIS — N390 Urinary tract infection, site not specified: Secondary | ICD-10-CM

## 2016-11-11 DIAGNOSIS — Z23 Encounter for immunization: Secondary | ICD-10-CM

## 2016-11-11 DIAGNOSIS — E440 Moderate protein-calorie malnutrition: Secondary | ICD-10-CM | POA: Insufficient documentation

## 2016-11-11 DIAGNOSIS — L89151 Pressure ulcer of sacral region, stage 1: Secondary | ICD-10-CM | POA: Diagnosis present

## 2016-11-11 DIAGNOSIS — Z8249 Family history of ischemic heart disease and other diseases of the circulatory system: Secondary | ICD-10-CM

## 2016-11-11 DIAGNOSIS — E876 Hypokalemia: Secondary | ICD-10-CM | POA: Diagnosis present

## 2016-11-11 DIAGNOSIS — Z7951 Long term (current) use of inhaled steroids: Secondary | ICD-10-CM

## 2016-11-11 DIAGNOSIS — R4182 Altered mental status, unspecified: Secondary | ICD-10-CM | POA: Diagnosis present

## 2016-11-11 DIAGNOSIS — R140 Abdominal distension (gaseous): Secondary | ICD-10-CM

## 2016-11-11 DIAGNOSIS — K5900 Constipation, unspecified: Secondary | ICD-10-CM | POA: Diagnosis present

## 2016-11-11 DIAGNOSIS — I712 Thoracic aortic aneurysm, without rupture: Secondary | ICD-10-CM | POA: Diagnosis present

## 2016-11-11 DIAGNOSIS — F0281 Dementia in other diseases classified elsewhere with behavioral disturbance: Secondary | ICD-10-CM | POA: Diagnosis present

## 2016-11-11 DIAGNOSIS — F02818 Dementia in other diseases classified elsewhere, unspecified severity, with other behavioral disturbance: Secondary | ICD-10-CM | POA: Diagnosis present

## 2016-11-11 DIAGNOSIS — F0280 Dementia in other diseases classified elsewhere without behavioral disturbance: Secondary | ICD-10-CM | POA: Diagnosis present

## 2016-11-11 DIAGNOSIS — Z951 Presence of aortocoronary bypass graft: Secondary | ICD-10-CM

## 2016-11-11 DIAGNOSIS — R443 Hallucinations, unspecified: Secondary | ICD-10-CM | POA: Diagnosis present

## 2016-11-11 DIAGNOSIS — Z781 Physical restraint status: Secondary | ICD-10-CM

## 2016-11-11 DIAGNOSIS — N401 Enlarged prostate with lower urinary tract symptoms: Secondary | ICD-10-CM | POA: Diagnosis present

## 2016-11-11 DIAGNOSIS — I4892 Unspecified atrial flutter: Secondary | ICD-10-CM | POA: Diagnosis present

## 2016-11-11 DIAGNOSIS — N179 Acute kidney failure, unspecified: Secondary | ICD-10-CM | POA: Diagnosis not present

## 2016-11-11 DIAGNOSIS — I10 Essential (primary) hypertension: Secondary | ICD-10-CM | POA: Diagnosis present

## 2016-11-11 DIAGNOSIS — E861 Hypovolemia: Secondary | ICD-10-CM | POA: Diagnosis present

## 2016-11-11 DIAGNOSIS — Z79899 Other long term (current) drug therapy: Secondary | ICD-10-CM

## 2016-11-11 DIAGNOSIS — E785 Hyperlipidemia, unspecified: Secondary | ICD-10-CM | POA: Diagnosis present

## 2016-11-11 DIAGNOSIS — A419 Sepsis, unspecified organism: Secondary | ICD-10-CM | POA: Diagnosis not present

## 2016-11-11 DIAGNOSIS — E7800 Pure hypercholesterolemia, unspecified: Secondary | ICD-10-CM | POA: Diagnosis present

## 2016-11-11 DIAGNOSIS — R791 Abnormal coagulation profile: Secondary | ICD-10-CM | POA: Diagnosis present

## 2016-11-11 LAB — URINALYSIS, COMPLETE (UACMP) WITH MICROSCOPIC
Bilirubin Urine: NEGATIVE
GLUCOSE, UA: NEGATIVE mg/dL
Ketones, ur: 5 mg/dL — AB
Leukocytes, UA: NEGATIVE
NITRITE: NEGATIVE
PH: 6 (ref 5.0–8.0)
PROTEIN: NEGATIVE mg/dL
Specific Gravity, Urine: 1.016 (ref 1.005–1.030)

## 2016-11-11 LAB — RAPID URINE DRUG SCREEN, HOSP PERFORMED
Amphetamines: NOT DETECTED
Barbiturates: NOT DETECTED
Benzodiazepines: NOT DETECTED
Cocaine: NOT DETECTED
Opiates: NOT DETECTED
Tetrahydrocannabinol: NOT DETECTED

## 2016-11-11 LAB — COMPREHENSIVE METABOLIC PANEL
ALK PHOS: 83 U/L (ref 38–126)
ALT: 13 U/L — ABNORMAL LOW (ref 17–63)
ANION GAP: 11 (ref 5–15)
AST: 21 U/L (ref 15–41)
Albumin: 3.7 g/dL (ref 3.5–5.0)
BILIRUBIN TOTAL: 1.4 mg/dL — AB (ref 0.3–1.2)
BUN: 20 mg/dL (ref 6–20)
CO2: 23 mmol/L (ref 22–32)
Calcium: 9.2 mg/dL (ref 8.9–10.3)
Chloride: 106 mmol/L (ref 101–111)
Creatinine, Ser: 0.98 mg/dL (ref 0.61–1.24)
GFR calc non Af Amer: >60 mL/min (ref >60)
Glucose, Bld: 107 mg/dL — ABNORMAL HIGH (ref 65–99)
POTASSIUM: 3.8 mmol/L (ref 3.5–5.1)
SODIUM: 140 mmol/L (ref 135–145)
TOTAL PROTEIN: 6.7 g/dL (ref 6.5–8.1)

## 2016-11-11 LAB — CBC WITH DIFFERENTIAL/PLATELET
BASOS ABS: 0 10*3/uL (ref 0.0–0.1)
Basophils Relative: 0 %
Eosinophils Absolute: 0.2 10*3/uL (ref 0.0–0.7)
Eosinophils Relative: 3 %
HCT: 43.5 % (ref 39.0–52.0)
Hemoglobin: 14.4 g/dL (ref 13.0–17.0)
Lymphocytes Relative: 12 %
Lymphs Abs: 0.9 10*3/uL (ref 0.7–4.0)
MCH: 30.4 pg (ref 26.0–34.0)
MCHC: 33.1 g/dL (ref 30.0–36.0)
MCV: 91.8 fL (ref 78.0–100.0)
Monocytes Absolute: 0.8 10*3/uL (ref 0.1–1.0)
Monocytes Relative: 11 %
NEUTROS PCT: 73 %
Neutro Abs: 5.1 10*3/uL (ref 1.7–7.7)
Platelets: 108 10*3/uL — ABNORMAL LOW (ref 150–400)
RBC: 4.74 MIL/uL (ref 4.22–5.81)
RDW: 13.4 % (ref 11.5–15.5)
WBC: 7 10*3/uL (ref 4.0–10.5)

## 2016-11-11 LAB — SALICYLATE LEVEL: Salicylate Lvl: <7 mg/dL (ref 2.8–30.0)

## 2016-11-11 LAB — I-STAT CG4 LACTIC ACID, ED: LACTIC ACID, VENOUS: 0.96 mmol/L (ref 0.5–1.9)

## 2016-11-11 LAB — PROTIME-INR
INR: 1.18
PROTHROMBIN TIME: 14.9 s (ref 11.4–15.2)

## 2016-11-11 LAB — ETHANOL: Alcohol, Ethyl (B): <5 mg/dL (ref <5)

## 2016-11-11 LAB — ACETAMINOPHEN LEVEL

## 2016-11-11 LAB — I-STAT TROPONIN, ED: TROPONIN I, POC: 0.04 ng/mL (ref 0.00–0.08)

## 2016-11-11 NOTE — Telephone Encounter (Signed)
New Message   Patient's neighbor called in with patient there regarding patient. She states patient is delirious and thinks it could be coming from medication, but she's not sure which one. Requesting call back

## 2016-11-11 NOTE — Telephone Encounter (Signed)
Spoke with pt neighbor, she reports this week the patient has been talking out of his head and acting strangely. She is not sure if he has taken his medications or not. She also reports he is having hallucinations and she is wondering what to do. Explained she can take him to the ER for evaluation or call family. She has his brothers number and will make sure the patient is okay. She will call his family if she feels he needs help.

## 2016-11-11 NOTE — ED Triage Notes (Signed)
Per EMS: Pt live at home and self medicates.  Pt was found today by neighbor having active auditory and visual hallucinations. Pt A&Ox4 and VSS.  Pt keeps talking to himself and answering his own questions. LKW is unknown  CBG 113 Pt in AFib

## 2016-11-11 NOTE — ED Provider Notes (Signed)
[facility] DEPT Provider Note   CSN: [id number] Arrival date & time: 11/11/16  1408     History   Chief Complaint Chief Complaint  Patient presents with  . Altered Mental Status    HPI Juan Wilkerson is a 76 y.o. male.  HPI  Patient, with a past medical history of A. Fib on Coumadin, hypertension, hyperlipidemia, presents to ED for evaluation of altered mental status, auditory and visual hallucinations for the past week. Remainder of history is limited by altered mental status.  Per telephone encounter with the patient's neighbor:  Spoke with pt neighbor, she reports this week the patient has been talking out of his head and acting strangely. She is not sure if he has taken his medications or not. She also reports he is having hallucinations and she is wondering what to do. Explained she can take him to the ER for evaluation or call family. She has his brothers number and will make sure the patient is okay. She will call his family if she feels he needs help.   Past Medical History:  Diagnosis Date  . Aortic aneurysm ([place]) 1994   From presumed Marfan's Syndrome.  Followed by Dr Stanford Breed.  Aneurysm involving the proximal descending thoracic aorta .    Marland Kitchen Atrial fibrillation ([place])    Followed by Dr Stanford Breed.  On coumadin.  . Atrial flutter ([place])    Followed by Dr Stanford Breed  . History of blood transfusion   . Hyperlipidemia   . Hypertension   . Marfan's syndrome with aortic dilation   . Osteoarthritis   . Pulmonary nodules    Found on chest CT 08/2007.  Monitoring with yearly CT.  3.37mm nodule in R iddle lobe.    Patient Active Problem List   Diagnosis Date Noted  . Constipation 09/12/2015  . Upper airway cough syndrome 04/16/2015  . Left renal mass   . Renal oncocytoma of left kidney   . Traumatic ecchymosis of multiple sites 12/03/2014  . Easy bruising 12/03/2014  . Arthritis of knee, left 12/15/2013  . Colon cancer screening 08/28/2013  . Word finding difficulty  08/28/2013  . Microscopic hematuria 11/29/2011  . Long term (current) use of anticoagulants 04/19/2010  . AORTIC VALVE REPLACEMENT, HX OF 01/24/2010  . ATRIAL FLUTTER 07/15/2009  . ESSENTIAL HYPERTENSION, BENIGN 04/29/2009  . Atrial fibrillation ([place]) 04/29/2009  . Aneurysm of thoracic aorta ([place]) 04/29/2009  . Degenerative arthritis of knee, bilateral 04/10/2009  . MARFAN'S SYNDROME 04/18/2007  . HYPERCHOLESTEROLEMIA 07/22/2006  . DEGENERATIVE JOINT DISEASE, BOTH KNEES, SEVERE 07/22/2006    Past Surgical History:  Procedure Laterality Date  . AORTIC VALVE REPLACEMENT  03/1993   St Jude Valve  . CARDIAC VALVE REPLACEMENT    . CORONARY ARTERY BYPASS GRAFT    . HIP FRACTURE SURGERY  03/1992   s/p R hip Fx  . IR GENERIC HISTORICAL  04/01/2016   IR RADIOLOGIST EVAL & MGMT 04/01/2016 Aletta Edouard, MD [facility] INTERV RAD  . KIDNEY SURGERY    . VASCULAR SURGERY    . Virtual Colonoscopy  11/01/2005   Pt on chronic coumadin for Aortic valve replacement and Atrial fibrilliation therefore at high risk if stopped.  Need to repeat every 5 yrs.        Home Medications    Prior to Admission medications   Medication Sig Start Date End Date Taking? Authorizing Provider  acetaminophen (TYLENOL) 500 MG tablet Take 1,000 mg by mouth 2 (two) times daily as needed. As needed for  pain    [provider]  amLODipine (NORVASC) 10 MG tablet TAKE 1 TABLET BY MOUTH EVERY DAY 07/07/16   Lelon Perla, MD  fluticasone (FLONASE) 50 MCG/ACT nasal spray Place 2 sprays into both nostrils daily. Patient not taking: Reported on 04/01/2016 04/16/15   Patrecia Pour, MD  Glucosamine-Chondroit-Vit C-Mn (GLUCOSAMINE-CHONDROITIN) TABS Take 2 tablets by mouth every morning.     [provider]  hydrocortisone 1 % cream Apply topically. Apply as needed for itching.    [provider]  loratadine (CLARITIN) 10 MG tablet Take 10 mg by mouth daily.    [provider]  metoprolol succinate  (TOPROL-XL) 50 MG 24 hr tablet TAKE 1 TABLET BY MOUTH TWICE A DAY TAKE WITH OR IMMEDIATELY FOLLOWING A MEAL 02/10/16   Lelon Perla, MD  Multiple Vitamin (MULTIVITAMIN) capsule Take 1 capsule by mouth every morning.     [provider]  pravastatin (PRAVACHOL) 40 MG tablet TAKE 1 TABLET BY MOUTH EVERY DAY 04/06/16   Lelon Perla, MD  tamsulosin (FLOMAX) 0.4 MG CAPS Take 1 capsule (0.4 mg total) by mouth daily. Patient not taking: Reported on 04/01/2016 09/29/12   Frazier Richards, MD  warfarin (COUMADIN) 3 MG tablet TAKE 1 TABLET BY MOUTH EVERY DAY BUT SATURDAY ON SATURDAY TAKE 1 & 1/2 TABLET 06/15/16   Lovenia Kim, MD    Family History Family History  Problem Relation Age of Onset  . Cancer Brother   . Hypertension Brother   . Aortic aneurysm Maternal Aunt     Social History Social History  Substance Use Topics  . Smoking status: Never Smoker  . Smokeless tobacco: Never Used  . Alcohol use No     Allergies   Lemon oil   Review of Systems Review of Systems  Unable to perform ROS: Mental status change  Constitutional: Positive for appetite change.  Respiratory: Negative for chest tightness.   Gastrointestinal: Negative for abdominal pain.     Physical Exam Updated Vital Signs BP (!) 147/64   Pulse 73   Temp 98.4 F (36.9 C) (Oral)   Resp 18   Ht 6\' 3"  (1.905 m)   Wt 79.4 kg (175 lb)   SpO2 98%   BMI 21.87 kg/m   Physical Exam  Constitutional: He appears well-developed and well-nourished. No distress.  HENT:  Head: Normocephalic and atraumatic.  Nose: Nose normal.  Eyes: Conjunctivae and EOM are normal. Left eye exhibits no discharge. No scleral icterus.  Neck: Normal range of motion. Neck supple.  Cardiovascular: Normal rate, normal heart sounds and intact distal pulses.  An irregularly irregular rhythm present. Exam reveals no gallop and no friction rub.   No murmur heard. Pulmonary/Chest: Effort normal and breath sounds normal. No respiratory  distress.  Abdominal: Soft. Bowel sounds are normal. He exhibits no distension. There is no tenderness. There is no guarding.  Musculoskeletal: Normal range of motion. He exhibits no edema.  Neurological: He is alert. No cranial nerve deficit or sensory deficit. He exhibits normal muscle tone. Coordination normal.  Patient is alert but is unoriented to place, year, month, situation.  Skin: Skin is warm and dry. No rash noted.  Psychiatric: He has a normal mood and affect.  Nursing note and vitals reviewed.    ED Treatments / Results  Labs (all labs ordered are listed, but only abnormal results are displayed) Labs Reviewed  COMPREHENSIVE METABOLIC PANEL - Abnormal; Notable for the following:  Result Value   Glucose, Bld 107 (*)    ALT 13 (*)    Total Bilirubin 1.4 (*)    All other components within normal limits  CBC WITH DIFFERENTIAL/PLATELET - Abnormal; Notable for the following:    Platelets 108 (*)    All other components within normal limits  ACETAMINOPHEN LEVEL - Abnormal; Notable for the following:    Acetaminophen (Tylenol), Serum <10 (*)    All other components within normal limits  ETHANOL  SALICYLATE LEVEL  URINALYSIS, COMPLETE (UACMP) WITH MICROSCOPIC  RAPID URINE DRUG SCREEN, HOSP PERFORMED  I-STAT CG4 LACTIC ACID, ED  I-STAT TROPONIN, ED  CBG MONITORING, ED    EKG  EKG Interpretation None       Radiology No results found.  Procedures Procedures (including critical care time)  Medications Ordered in ED Medications - No data to display   Initial Impression / Assessment and Plan / ED Course  I have reviewed the triage vital signs and the nursing notes.  Pertinent labs & imaging results that were available during my care of the patient were reviewed by me and considered in my medical decision making (see chart for details).     Patient, with past medical history of A. fib on Coumadin, hypertension, hyperlipidemia presents to ED for evaluation  of altered mental status, auditory visual hallucinations and probable delirium for the past week. The patient's neighbor was concerned about patient "talking out of his head and acting strangely for the past week." Unsure if taking medications. He does live at home by himself. My exam patient is alert but not oriented to place, time or situation. He does appear to be speaking nonsensical things. He has no neurological deficits on physical exam. He is afebrile. No history of fever. Gastritis medications, he states that he takes them "every once in a while." Head CT unremarkable. Lab work including CBC, CMP, ethanol, salicylate level, acetaminophen level, lactate, troponin, urinalysis and urine drug screen are all unremarkable. Unsure of cause of delirium and altered mental status. Appears more medication related than infectious. We'll consult family medicine for admission and further workup. Appreciate their help for management of this patient.  Final Clinical Impressions(s) / ED Diagnoses   Final diagnoses:  None    New Prescriptions New Prescriptions   No medications on file     Delia Heady, Hershal Coria 11/11/16 1951    Margette Fast, MD 11/12/16 1100

## 2016-11-11 NOTE — H&P (Signed)
[facility] Admission History and Physical Service Pager: [phone]  Patient name: Juan Wilkerson Medical record number: [id number] Date of birth: [birth year] Age: 76 y.o. Gender: male  Primary Care Provider: Lovenia Kim, MD Consultants: None Code Status: Full (not able to obtain this on admission)  Chief Complaint: Altered mental status/audiovisual hallucination  Assessment and Plan: Juan Wilkerson is a 76 y.o. male presenting with altered mental status. PMH is significant for atrial flutter/fibrillation on warfarin, hypertension, hyperlipidemia, ?Marfan's syndrome, Aortic valve replacement, microscopic hematuria, left renal mass  Altered mental status: not clear if this is a progression of chronic issue or acute on chronic. No focal neuro deficits to think of CVA. UDS, EtOH level, Tylenol level, salicylate level within normal limits. History exam and lab findings not suggestive for infectious etiology. I wonder if he has underlying dementia. Per patient's emergency contact, patient has gradual worsening of mental status/confusion over the last 1 year. However, the audiovisual hallucination appears to be new per patient's neighbor who talked to ED provider. He is alert and awake but oriented only to self. He has no psychiatric history and problem list. Not on medication that could cause AMS.  -Admit for observation to telemetry. Attending Dr. Nori Riis -We will monitor neuro status -We'll obtain MMSE down the road -Avoid sedating medications -Check TSH  Atrial flutter/fibrillation without RVR: Appears to be on metoprolol succinate 50 mg daily and warfarin 3 mg daily. PT/INR subtherapeutic. Chadvasc score 3 (hypertension and age) -Heparin per pharmacy -Metoprolol 25 mg twice a day -We will consider resuming warfarin in the morning given history of aortic valve. Not sure where he gets his INR checked  Prosthetic aortic valve: Supposedly on warfarin at home but INR  subtherapeutic. Since he is followed by Dr. Stanford Breed.  -Heparin as above -Consider cardiology consult in the morning  Hypertension: Normotensive -Metoprolol as above -Resume amlodipine in the morning  Hyperlipidemia: On pravastatin at home -Consider atorvastatin -Lipid panel in the morning  Microscopic hematuria: Seems to be a chronic issue -Outpatient workup/urology  ? BPH: On Flomax at home. Has about 300 mL normal-looking urine in a urinal at bedside -PVR -Continue home Flomax  FEN/GI:  -Heart healthy diet  Prophylaxis: -Heparin gtt  Disposition: Admit to telemetry for observation and further workup  History of Present Illness:  Juan Wilkerson is a 76 y.o. male presenting with with altered mental status. Patient is alert and awake but not able to provide history. When asked what brought him to the hospital, he says "there are people investigating techniques for new medication. They are to tracks cruising in the neighborhood". He is oriented to self but not to place, time or person.  Called his emergency contact Juan Wilkerson to obtain collateral history. He says he last saw the patient about 2 days ago. At that time, he did not notice any thing new. Per Juan Wilkerson, patient has gradually worsening mental status over the year. Juan Wilkerson doesn't think he drinks alcohol or use recreational drug.  Per EMS report to ED provider, patient was found by the neighbor with active  audiovisual hallucination. So, neighbor called EMS. ED course: Vital signs within normal limits. CMP,  troponin, lactic acid, Tylenol level, salicylate level, EtOH level, CT head and UDS within normal limits. CBC with thrombocytopenia to 108. UA with moderate hemoglobin otherwise benign. EKG with atrial flutter (old) ROS Not able to obtain review of systems due to patient's mental status.  Patient Active Problem List  Diagnosis Date Noted  . Constipation 09/12/2015  . Upper airway cough syndrome 04/16/2015  . Left  renal mass   . Renal oncocytoma of left kidney   . Traumatic ecchymosis of multiple sites 12/03/2014  . Easy bruising 12/03/2014  . Arthritis of knee, left 12/15/2013  . Colon cancer screening 08/28/2013  . Word finding difficulty 08/28/2013  . Microscopic hematuria 11/29/2011  . Long term (current) use of anticoagulants 04/19/2010  . AORTIC VALVE REPLACEMENT, HX OF 01/24/2010  . ATRIAL FLUTTER 07/15/2009  . ESSENTIAL HYPERTENSION, BENIGN 04/29/2009  . Atrial fibrillation ([place]) 04/29/2009  . Aneurysm of thoracic aorta ([place]) 04/29/2009  . Degenerative arthritis of knee, bilateral 04/10/2009  . MARFAN'S SYNDROME 04/18/2007  . HYPERCHOLESTEROLEMIA 07/22/2006  . DEGENERATIVE JOINT DISEASE, BOTH KNEES, SEVERE 07/22/2006    Past Medical History: Past Medical History:  Diagnosis Date  . Aortic aneurysm ([place]) 1994   From presumed Marfan's Syndrome.  Followed by Dr Stanford Breed.  Aneurysm involving the proximal descending thoracic aorta .    Marland Kitchen Atrial fibrillation ([place])    Followed by Dr Stanford Breed.  On coumadin.  . Atrial flutter ([place])    Followed by Dr Stanford Breed  . History of blood transfusion   . Hyperlipidemia   . Hypertension   . Marfan's syndrome with aortic dilation   . Osteoarthritis   . Pulmonary nodules    Found on chest CT 08/2007.  Monitoring with yearly CT.  3.11mm nodule in R iddle lobe.    Past Surgical History: Past Surgical History:  Procedure Laterality Date  . AORTIC VALVE REPLACEMENT  03/1993   St Jude Valve  . CARDIAC VALVE REPLACEMENT    . CORONARY ARTERY BYPASS GRAFT    . HIP FRACTURE SURGERY  03/1992   s/p R hip Fx  . IR GENERIC HISTORICAL  04/01/2016   IR RADIOLOGIST EVAL & MGMT 04/01/2016 Aletta Edouard, MD [facility] INTERV RAD  . KIDNEY SURGERY    . VASCULAR SURGERY    . Virtual Colonoscopy  11/01/2005   Pt on chronic coumadin for Aortic valve replacement and Atrial fibrilliation therefore at high risk if stopped.  Need to repeat every 5 yrs.     Social  History: Social History  Substance Use Topics  . Smoking status: Never Smoker  . Smokeless tobacco: Never Used  . Alcohol use No   Additional social history: Please also refer to relevant sections of EMR.  Family History: Family History  Problem Relation Age of Onset  . Cancer Brother   . Hypertension Brother   . Aortic aneurysm Maternal Aunt    (If not completed, MUST add something in)  Allergies and Medications: Allergies  Allergen Reactions  . Lemon Oil Other (See Comments)    Only skin contact dermatitis   No current facility-administered medications on file prior to encounter.    Current Outpatient Prescriptions on File Prior to Encounter  Medication Sig Dispense Refill  . acetaminophen (TYLENOL) 500 MG tablet Take 1,000 mg by mouth 2 (two) times daily as needed. As needed for pain    . amLODipine (NORVASC) 10 MG tablet TAKE 1 TABLET BY MOUTH EVERY DAY 90 tablet 2  . fluticasone (FLONASE) 50 MCG/ACT nasal spray Place 2 sprays into both nostrils daily. (Patient not taking: Reported on 04/01/2016) 16 g 0  . Glucosamine-Chondroit-Vit C-Mn (GLUCOSAMINE-CHONDROITIN) TABS Take 2 tablets by mouth every morning.     . hydrocortisone 1 % cream Apply topically. Apply as needed for itching.    . loratadine (CLARITIN) 10  MG tablet Take 10 mg by mouth daily.    . metoprolol succinate (TOPROL-XL) 50 MG 24 hr tablet TAKE 1 TABLET BY MOUTH TWICE A DAY TAKE WITH OR IMMEDIATELY FOLLOWING A MEAL 180 tablet 2  . Multiple Vitamin (MULTIVITAMIN) capsule Take 1 capsule by mouth every morning.     . pravastatin (PRAVACHOL) 40 MG tablet TAKE 1 TABLET BY MOUTH EVERY DAY 90 tablet 2  . tamsulosin (FLOMAX) 0.4 MG CAPS Take 1 capsule (0.4 mg total) by mouth daily. (Patient not taking: Reported on 04/01/2016) 30 capsule 0  . warfarin (COUMADIN) 3 MG tablet TAKE 1 TABLET BY MOUTH EVERY DAY BUT SATURDAY ON SATURDAY TAKE 1 & 1/2 TABLET 45 tablet 8    Objective: BP (!) 118/58   Pulse (!) 51   Temp 98.4  F (36.9 C) (Oral)   Resp 17   Ht 6\' 3"  (1.905 m)   Wt 175 lb (79.4 kg)   SpO2 95%   BMI 21.87 kg/m  Exam: GEN: appears well, no apparent distress. Head: normocephalic and atraumatic  Eyes: conjunctiva without injection, sclera anicteric Oropharynx: mmm without erythema or exudation. Poor dentition HEM: negative for cervical or periauricular lymphadenopathies CVS: Irregularly irregular, S2 louder than S1, no murmurs, no edema,  2+ DP pulses bilaterally RESP: no IWOB, good air movement bilaterally, CTAB GI: BS present & normal, soft, NTND GU: no suprapubic or CVA tenderness MSK: no focal tenderness or notable swelling. Small lipoma on his back SKIN: no apparent skin lesion ENDO: negative thyromegally NEURO: Awake and alert, oriented only to self. CN II-XII within normal limits, motor 5/5 in all extremities, lysis sensation intact in all dermatomes of upper and lower extremities.  PSYCH: appropriately dressed, not attentive. Thought process is unclear. Not able to maintain train of thought and concentrate on the questions.   Labs and Imaging: CBC BMET   Recent Labs Lab 11/11/16 1430  WBC 7.0  HGB 14.4  HCT 43.5  PLT 108*    Recent Labs Lab 11/11/16 1430  NA 140  K 3.8  CL 106  CO2 23  BUN 20  CREATININE 0.98  GLUCOSE 107*  CALCIUM 9.2     Mercy Riding, MD 11/11/2016, 7:37 PM PGY-3, [place] [facility] pager: [phone], text pages welcome

## 2016-11-12 DIAGNOSIS — L8990 Pressure ulcer of unspecified site, unspecified stage: Secondary | ICD-10-CM | POA: Diagnosis present

## 2016-11-12 DIAGNOSIS — R410 Disorientation, unspecified: Secondary | ICD-10-CM

## 2016-11-12 DIAGNOSIS — R441 Visual hallucinations: Secondary | ICD-10-CM | POA: Diagnosis not present

## 2016-11-12 DIAGNOSIS — R440 Auditory hallucinations: Secondary | ICD-10-CM | POA: Diagnosis not present

## 2016-11-12 DIAGNOSIS — R4182 Altered mental status, unspecified: Secondary | ICD-10-CM

## 2016-11-12 DIAGNOSIS — F05 Delirium due to known physiological condition: Secondary | ICD-10-CM

## 2016-11-12 LAB — CBC
HCT: 47.1 % (ref 39.0–52.0)
Hemoglobin: 15.8 g/dL (ref 13.0–17.0)
MCH: 30.9 pg (ref 26.0–34.0)
MCHC: 33.5 g/dL (ref 30.0–36.0)
MCV: 92.2 fL (ref 78.0–100.0)
PLATELETS: 113 10*3/uL — AB (ref 150–400)
RBC: 5.11 MIL/uL (ref 4.22–5.81)
RDW: 13.2 % (ref 11.5–15.5)
WBC: 7.8 10*3/uL (ref 4.0–10.5)

## 2016-11-12 LAB — LIPID PANEL
CHOLESTEROL: 159 mg/dL (ref 0–200)
HDL: 41 mg/dL (ref >40)
LDL CALC: 92 mg/dL (ref 0–99)
Total CHOL/HDL Ratio: 3.9 RATIO
Triglycerides: 132 mg/dL (ref <150)
VLDL: 26 mg/dL (ref 0–40)

## 2016-11-12 LAB — RPR: RPR: NONREACTIVE

## 2016-11-12 LAB — HEPARIN LEVEL (UNFRACTIONATED)
HEPARIN UNFRACTIONATED: 0.45 [IU]/mL (ref 0.30–0.70)
Heparin Unfractionated: 0.22 IU/mL — ABNORMAL LOW (ref 0.30–0.70)

## 2016-11-12 LAB — PROTIME-INR
INR: 1.23
Prothrombin Time: 15.4 seconds — ABNORMAL HIGH (ref 11.4–15.2)

## 2016-11-12 LAB — TSH: TSH: 1.491 u[IU]/mL (ref 0.350–4.500)

## 2016-11-12 MED ORDER — WARFARIN - PHARMACIST DOSING INPATIENT: Freq: Every day | Status: DC

## 2016-11-12 MED ORDER — METOPROLOL TARTRATE 25 MG PO TABS
25.0000 mg | ORAL_TABLET | Freq: Two times a day (BID) | ORAL | Status: DC
  Administered 2016-11-12 – 2016-11-18 (×11): 25 mg via ORAL
  Filled 2016-11-12 (×13): qty 1

## 2016-11-12 MED ORDER — AMLODIPINE BESYLATE 10 MG PO TABS
10.0000 mg | ORAL_TABLET | Freq: Every day | ORAL | Status: DC
  Administered 2016-11-12 – 2016-11-24 (×12): 10 mg via ORAL
  Filled 2016-11-12: qty 2
  Filled 2016-11-12: qty 1
  Filled 2016-11-12: qty 2
  Filled 2016-11-12: qty 1
  Filled 2016-11-12: qty 2
  Filled 2016-11-12 (×2): qty 1
  Filled 2016-11-12 (×2): qty 2
  Filled 2016-11-12 (×3): qty 1
  Filled 2016-11-12: qty 2
  Filled 2016-11-12: qty 1

## 2016-11-12 MED ORDER — RISPERIDONE 0.25 MG PO TABS
0.2500 mg | ORAL_TABLET | Freq: Two times a day (BID) | ORAL | Status: DC
  Administered 2016-11-12 – 2016-11-18 (×10): 0.25 mg via ORAL
  Filled 2016-11-12 (×15): qty 1

## 2016-11-12 MED ORDER — TAMSULOSIN HCL 0.4 MG PO CAPS
0.4000 mg | ORAL_CAPSULE | Freq: Every day | ORAL | Status: DC
  Administered 2016-11-12 – 2016-11-22 (×10): 0.4 mg via ORAL
  Filled 2016-11-12 (×11): qty 1

## 2016-11-12 MED ORDER — HALOPERIDOL LACTATE 5 MG/ML IJ SOLN
5.0000 mg | Freq: Once | INTRAMUSCULAR | Status: AC
Start: 2016-11-12 — End: 2016-11-12
  Administered 2016-11-12: 5 mg via INTRAVENOUS
  Filled 2016-11-12: qty 1

## 2016-11-12 MED ORDER — HEPARIN (PORCINE) IN NACL 100-0.45 UNIT/ML-% IJ SOLN
1400.0000 [IU]/h | INTRAMUSCULAR | Status: DC
  Administered 2016-11-12: 1150 [IU]/h via INTRAVENOUS
  Administered 2016-11-13: 1350 [IU]/h via INTRAVENOUS
  Administered 2016-11-14 (×2): 1400 [IU]/h via INTRAVENOUS
  Filled 2016-11-12 (×4): qty 250

## 2016-11-12 MED ORDER — WARFARIN SODIUM 5 MG PO TABS
5.0000 mg | ORAL_TABLET | Freq: Once | ORAL | Status: AC
  Administered 2016-11-12: 5 mg via ORAL
  Filled 2016-11-12: qty 1

## 2016-11-12 MED ORDER — WARFARIN SODIUM 3 MG PO TABS: 3.0000 mg | ORAL_TABLET | ORAL | Status: DC

## 2016-11-12 MED ORDER — LORAZEPAM 2 MG/ML IJ SOLN
0.5000 mg | Freq: Once | INTRAMUSCULAR | Status: DC
  Filled 2016-11-12: qty 1

## 2016-11-12 NOTE — Progress Notes (Signed)
ANTICOAGULATION CONSULT NOTE - Initial Consult  Pharmacy Consult for heparin (PTA warfarin on hold) Indication: history of afib/flutter and aortic valve replacement   Allergies  Allergen Reactions  . Lemon Oil Other (See Comments)    Only skin contact dermatitis    Patient Measurements: Height: 6\' 3"  (190.5 cm) Weight: 175 lb (79.4 kg) IBW/kg (Calculated) : 84.5 Heparin Dosing Weight: 79.4 kg   Vital Signs: Temp: 98.4 F (36.9 C) (09/05 1409) Temp Source: Oral (09/05 1409) BP: 124/75 (09/06 0100) Pulse Rate: 64 (09/06 0100)  Labs:  Recent Labs  11/11/16 1430 11/11/16 2012  HGB 14.4  --   HCT 43.5  --   PLT 108*  --   LABPROT  --  14.9  INR  --  1.18  CREATININE 0.98  --     Estimated Creatinine Clearance: 70.9 mL/min (by C-G formula based on SCr of 0.98 mg/dL).   Medical History: Past Medical History:  Diagnosis Date  . Aortic aneurysm ([place]) 1994   From presumed Marfan's Syndrome.  Followed by Dr Stanford Breed.  Aneurysm involving the proximal descending thoracic aorta .    Marland Kitchen Atrial fibrillation ([place])    Followed by Dr Stanford Breed.  On coumadin.  . Atrial flutter ([place])    Followed by Dr Stanford Breed  . History of blood transfusion   . Hyperlipidemia   . Hypertension   . Marfan's syndrome with aortic dilation   . Osteoarthritis   . Pulmonary nodules    Found on chest CT 08/2007.  Monitoring with yearly CT.  3.24mm nodule in R iddle lobe.   Assessment: 75 yo male admitted with AMS. History of afib/flutter/aortic valve replacement on PTA warfarin. Pharmacy consulted to dose heparin while warfarin on hold. INR today is 1.18 and last dose of warfarin unknown. Per patient, usually takes medications in the morning and is unsure if he took his warfarin today.   Hgb normal and plt slightly low at 108. No s/s bleeding noted  PTA dose warfarin: 1.5 mg TuThSat and 3mg  all other days (per [facility] visit on 8/7)  Goal of Therapy:  Heparin level 0.3-0.7 units/ml Monitor platelets by  anticoagulation protocol: Yes   Plan:  No bolus d/t quationable recent warfarin dose  Start heparin gtt at 1150 units/hr  Heparin level in 8 hrs Daily heparin level and CBC Monitor for s/s bleeding F/u resuming warfarin   Argie Ramming, PharmD Clinical Pharmacist 11/12/16 1:23 AM

## 2016-11-12 NOTE — Progress Notes (Signed)
Family Medicine Teaching Service Daily Progress Note Intern Pager: [phone]  Patient name: Juan Wilkerson Medical record number: [id number] Date of birth: 1941/02/12 Age: 76 y.o. Gender: male  Primary Care Provider: Lovenia Kim, MD Consultants: Psych Code Status: Full  Pt Overview and Major Events to Date:  Juan Wilkerson is a 76 y.o. male presenting with altered mental status. PMH is significant for atrial flutter/fibrillation on warfarin, hypertension, hyperlipidemia, Marfan's syndrome, Aortic valve replacement, microscopic hematuria, left renal mass  Assessment and Plan:  Altered mental status: not clear if this is a progression of chronic issue or acute on chronic. No focal neuro deficits noted to suggest CVA. UDS, EtOH level, Tylenol level, salicylate level all wnl. History exam and lab findings not suggestive for infectious etiology. The audiovisual hallucination appears to be new per patient's neighbor who talked to ED provider. However this may have been gradual onset over the past year. He is alert and awake but oriented only to self. He has no psychiatric history and problem list. Not on medication that could cause AMS. Rule out Lewy Body dementia. -Monitor neuro status -We'll obtain MMSE down the road -Avoid sedating medications -TSH 1.491 - Obtain MRI brain without contrast - Will get sitter for patient being restless in room - Psych consult   Atrial flutter/fibrillation without RVR: Appears to be on metoprolol succinate 50 mg daily and warfarin 3 mg daily. PT/INR subtherapeutic. Chadvasc score 3 (htn and age) -Heparin per pharmacy -Metoprolol 25 mg twice a day -Consider resuming warfarin given history of aortic valve  Prosthetic aortic valve: On warfarin at home but INR subtherapeutic. Followed by Dr. Stanford Breed.  -Heparin as above, restarted home dosage of warfarin and will d/c heparin once he is at therapeutic levels. -Consider cardiology consult  Hypertension: Stable.  Normotensive. Last BP 100/66 -Metoprolol as above - Continue home Amlodipine   Hyperlipidemia: On pravastatin at home -Holding home atorvastatin  -Lipid panel: Tot chol 159, LDL 92, HDL 41  Microscopic hematuria: Seems to be a chronic issue -Outpatient workup/urology  BPH: On Flomax at home. Has about 300 mL normal-looking urine in a urinal at bedside -PVR pending -Continue home Flomax  FEN/GI: Heart healthy diet Prophylaxis: Heparin gtt  Disposition: Pending psych work-up  Subjective:  Patient sitting up in bed. Pleasant. Frustrated with himself for not knowing answers to questions. Patient reports that we are in a [place] and there is man with the yellow fluffy hair in response to where are we. He cannot tell me what he used to do and gets frustrated that he cannot think of the answer.   Objective: Temp:  [97.4 F (36.3 C)-98.4 F (36.9 C)] 97.4 F (36.3 C) (09/06 0837) Pulse Rate:  [51-93] 71 (09/06 0837) Resp:  [10-27] 19 (09/06 0837) BP: (92-151)/(58-97) 100/66 (09/06 0837) SpO2:  [91 %-99 %] 96 % (09/06 0837) Weight:  [175 lb (79.4 kg)] 175 lb (79.4 kg) (09/05 1410) Physical Exam: General: NAD, pleasant, confused Cardiovascular: Irregular rhythm, regular rate, S2 louder than S1, no murmurs, no edema Respiratory: CTA BL, normal work of breathing Abdomen: non-distended Skin: 1" x 1" non-tender subcutaneous cyst noted over left mid back- no draining erythema or purulence noted Extremities: Moves all 4 extremties Neuro: Awake and alert, oriented only to self. Psych: Thought process is unclear. Not able to maintain train of thought and unable to concentrate on questions.   Laboratory:  Recent Labs Lab 11/11/16 1430 11/12/16 0343  WBC 7.0 7.8  HGB 14.4 15.8  HCT 43.5  47.1  PLT 108* 113*    Recent Labs Lab 11/11/16 1430  NA 140  K 3.8  CL 106  CO2 23  BUN 20  CREATININE 0.98  CALCIUM 9.2  PROT 6.7  BILITOT 1.4*  ALKPHOS 83  ALT 13*  AST 21   GLUCOSE 107*   UDS -, EtOH <5, Salicylate < 7, Acetaminophen <10 TSH 1.491 PT 15.4/ INR 1.23  Imaging/Diagnostic Tests: Ct Head Wo Contrast  Result Date: 11/11/2016 CLINICAL DATA:  Altered mental status, visual and auditory hallucinations. History of hypertension, Marfan's syndrome. EXAM: CT HEAD WITHOUT CONTRAST TECHNIQUE: Contiguous axial images were obtained from the base of the skull through the vertex without intravenous contrast. COMPARISON:  MRI of the head May 11, 2013 FINDINGS: BRAIN: No intraparenchymal hemorrhage, mass effect nor midline shift. The ventricles and sulci are normal for age. Patchy supratentorial white matter hypodensities less than expected for patient's age, though non-specific are most compatible with chronic small vessel ischemic disease. Old small LEFT cerebellar infarct. No acute large vascular territory infarcts. No abnormal extra-axial fluid collections. Basal cisterns are patent. VASCULAR: Trace calcific atherosclerosis of the carotid siphons. SKULL: No skull fracture. No significant scalp soft tissue swelling. SINUSES/ORBITS: Mild RIGHT ethmoid mucosal thickening without air-fluid levels. Mastoid air cells are well aerated.The included ocular globes and orbital contents are non-suspicious. OTHER: None. IMPRESSION: 1. No acute intracranial process. 2. Stable examination: Old small LEFT cerebellar infarct, otherwise negative noncontrast CT HEAD for age. Electronically Signed   By: Elon Alas M.D.   On: 11/11/2016 17:33    Lanise Mergen, [place], DO 11/12/2016, 9:20 AM PGY-1, [place] Intern pager: [phone], text pages welcome

## 2016-11-12 NOTE — Progress Notes (Signed)
Pt  Constantly attempting to getting out of bed not redirectable by staff or sitter. RN had to stay with pt most of the day and could not redirect pt. Pt given 5 mg haldol with no effect MD notified.

## 2016-11-12 NOTE — ED Notes (Addendum)
Verified heparin with Freida Busman, RN

## 2016-11-12 NOTE — Progress Notes (Signed)
ANTICOAGULATION CONSULT NOTE - Follow Up Consult  Pharmacy Consult for Heparin and Coumadin Indication: atrial fibrillation and mechanical AVR  Allergies  Allergen Reactions  . Lemon Oil Other (See Comments)    Only skin contact dermatitis    Patient Measurements: Height: 6\' 3"  (190.5 cm) Weight: 175 lb (79.4 kg) IBW/kg (Calculated) : 84.5 Heparin Dosing Weight: 79.4 kg  Vital Signs: Temp: 97.4 F (36.3 C) (09/06 0837) Temp Source: Oral (09/06 0837) BP: 132/90 (09/06 1026) Pulse Rate: 67 (09/06 1026)  Labs:  Recent Labs  11/11/16 1430 11/11/16 2012 11/12/16 0343 11/12/16 1009  HGB 14.4  --  15.8  --   HCT 43.5  --  47.1  --   PLT 108*  --  113*  --   LABPROT  --  14.9 15.4*  --   INR  --  1.18 1.23  --   HEPARINUNFRC  --   --   --  0.22*  CREATININE 0.98  --   --   --     Estimated Creatinine Clearance: 70.9 mL/min (by C-G formula based on SCr of 0.98 mg/dL).   Medications:  Scheduled:  . amLODipine  10 mg Oral Daily  . LORazepam  0.5 mg Intravenous Once  . metoprolol tartrate  25 mg Oral BID  . tamsulosin  0.4 mg Oral Daily  . warfarin  3-4.5 mg Oral See admin instructions   Infusions:  . heparin 1,150 Units/hr (11/12/16 0211)    Assessment: 76 yo M presented with AMS, actively hallucinating, concern for Lewy Body dementia.  Noted to have subtherapeutic INR on admission, likely from noncompliance with medications with AMS.  Started on heparin infusion give mechanical AVR.  To also restart Coumadin today.  Heparin level is subtherapeutic on 1150 units/hr.  PTA dose warfarin: 1.5 mg TuThSat and 3mg  all other days (per [facility] visit on 8/7)  Goal of Therapy:  INR 2.5-3.5 for mechanical AVR + Afib Heparin level 0.3-0.7 units/ml Monitor platelets by anticoagulation protocol: Yes   Plan:  Increase heparin to 1350 units/hr  Heparin level in 6 hrs Daily heparin level and CBC Coumadin 5mg  PO x 1 tonight Daily INR Monitor for s/s bleeding  [employer],  Pharm.D., BCPS Clinical Pharmacist Pager: [phone] Clinical phone for 11/12/2016 from 8:30-4:00 is [phone]. After 4pm, please call Main [facility] [phone]) for assistance. 11/12/2016 11:43 AM

## 2016-11-12 NOTE — Progress Notes (Signed)
Soft wrist restraints applied at 1715

## 2016-11-12 NOTE — Consult Note (Signed)
[facility] Consult   Reason for Consult:  AMS and hallucinations Referring Physician:  Dr. Nori Riis Patient Identification: Juan Wilkerson MRN:  [id number] Principal Diagnosis: Altered mental status Diagnosis:   Patient Active Problem List   Diagnosis Date Noted  . Altered mental status [R41.82] 11/11/2016  . Constipation [K59.00] 09/12/2015  . Upper airway cough syndrome [R05] 04/16/2015  . Left renal mass [N28.89]   . 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 Traumatic ecchymosis of multiple sites [T14.8XXA] 12/03/2014  . Easy bruising [R23.8] 12/03/2014  . Arthritis of knee, left [M17.12] 12/15/2013  . Colon cancer screening [Z12.11] 08/28/2013  . Word finding difficulty [R47.89] 08/28/2013  . Microscopic hematuria [R31.29] 11/29/2011  . Long term (current) use of anticoagulants [Z79.01] 04/19/2010  . AORTIC VALVE REPLACEMENT, HX OF [Z95.4] 01/24/2010  . ATRIAL FLUTTER [I48.92] 07/15/2009  . ESSENTIAL HYPERTENSION, BENIGN [I10] 04/29/2009  . Atrial fibrillation ([place]) [I48.91] 04/29/2009  . Aneurysm of thoracic aorta ([place]) [I71.2] 04/29/2009  . Degenerative arthritis of knee, bilateral [M17.0] 04/10/2009  . 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 HYPERCHOLESTEROLEMIA [E78.00] 07/22/2006  . DEGENERATIVE JOINT DISEASE, BOTH KNEES, SEVERE [M17.10] 07/22/2006    Total Time spent with patient: 1 hour  Subjective:   Juan Wilkerson is a 76 y.o. male patient admitted with AMS.  HPI:  Juan Wilkerson is a 76 y.o. male, Seen, chart reviewed  prefor this face-to-face psychiatric consultation and evaluation of altered mental status and reportedly auditory and visual hallucinations. Patient is awake, alert and at the same time extremely poor historian. Patient reported that he is not doing well for the last one week because of visual hallucinations reportedly seeing 5 people but denies current hallucinations. Patient reported that her people investigating techniques of new  medication and that our tracks cruising in the neighborhood. Patient seems to be oriented to his first name only and is a date of birth. Patient is not oriented. The rest of the questions, poor concentration, memory and language functions. Patient identified his appear the same as a Juan Wilkerson when prompted.   . Per EMS report to ED provider, patient was found by the neighbor with active  audiovisual hallucination. So, neighbor called EMS. ED course: Vital signs within normal limits. CMP,  troponin, lactic acid, Tylenol level, salicylate level, EtOH level, CT head and UDS within normal limits. CBC with thrombocytopenia to 108. UA with moderate hemoglobin otherwise benign. EKG with atrial flutter (old) ROS: Not able to obtain review of systems due to patient's mental status.   Past Psychiatric History: None reported.  Risk to Self: Is patient at risk for suicide?: No Risk to Others:   Prior Inpatient Therapy:   Prior Outpatient Therapy:    Past Medical History:  Past Medical History:  Diagnosis Date  . Aortic aneurysm ([place]) 1994   From presumed Marfan's Syndrome.  Followed by Dr Stanford Breed.  Aneurysm involving the proximal descending thoracic aorta .    Marland Kitchen Atrial fibrillation ([place])    Followed by Dr Stanford Breed.  On coumadin.  . Atrial flutter ([place])    Followed by Dr Stanford Breed  . History of blood transfusion   . Hyperlipidemia   . Hypertension   . Marfan's syndrome with aortic dilation   . Osteoarthritis   . Pulmonary nodules    Found on chest CT 08/2007.  Monitoring with yearly CT.  3.69m nodule in R iddle lobe.    Past Surgical History:  Procedure Laterality Date  . AORTIC VALVE REPLACEMENT  03/1993   St Jude Valve  . CARDIAC VALVE REPLACEMENT    . CORONARY ARTERY BYPASS GRAFT    . HIP FRACTURE SURGERY  03/1992   s/p R hip Fx  . IR GENERIC HISTORICAL  04/01/2016   IR RADIOLOGIST EVAL & MGMT 04/01/2016 Aletta Edouard, MD [facility] INTERV RAD  . KIDNEY SURGERY    . VASCULAR SURGERY    . Virtual  Colonoscopy  11/01/2005   Pt on chronic coumadin for Aortic valve replacement and Atrial fibrilliation therefore at high risk if stopped.  Need to repeat every 5 yrs.    Family History:  Family History  Problem Relation Age of Onset  . Cancer Brother   . Hypertension Brother   . Aortic aneurysm Maternal Aunt    Family Psychiatric  History: Unknown Social History:  History  Alcohol Use No     History  Drug Use No    Social History   Social History  . Marital status: Divorced    Spouse name: N/A  . Number of children: 0  . Years of education: 45   Occupational History  . [employer] Retired  . 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 Smoking status: Never Smoker  . Smokeless tobacco: Never Used  . Alcohol use No  . Drug use: No  . Sexual activity: No   Other Topics Concern  . None   Social History Narrative   Divorced, lives alone. Has a daughter (pt does not have contact with her, has not seen her since she was 57).    Studied music in college.   Plays oboe with [employer].    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   No smoking. No alcohol use. No recreational drugs.      Health Care POA:    Emergency Contact: Juan Wilkerson [phone]   End of Life Plan: Gave pt the ad pamplet   Who lives with you: Lives by himself   Any pets: 0   Diet: pt diet varies   Exercise: walks and going up and down stairs most days   Seatbelts: pt uses seat belt when in his car   Sun Exposure/Protection: pt uses hats and long sleeves   Hobbies: harmonica                  Additional Social History:    Allergies:   Allergies  Allergen Reactions  . Lemon Oil Other (See Comments)    Only skin contact dermatitis    Labs:  Results for orders placed or performed during the hospital encounter of 11/11/16 (from the past 48 hour(s))  Comprehensive metabolic panel     Status: Abnormal   Collection Time: 11/11/16  2:30 PM   Result Value Ref Range   Sodium 140 135 - 145 mmol/L   Potassium 3.8 3.5 - 5.1 mmol/L   Chloride 106 101 - 111 mmol/L   CO2 23 22 - 32 mmol/L   Glucose, Bld 107 (H) 65 - 99 mg/dL   BUN 20 6 - 20 mg/dL   Creatinine, Ser 0.98 0.61 - 1.24 mg/dL   Calcium 9.2 8.9 - 10.3 mg/dL   Total Protein 6.7 6.5 - 8.1 g/dL   Albumin 3.7 3.5 - 5.0 g/dL   AST 21 15 - 41 U/L   ALT 13 (L) 17 - 63 U/L   Alkaline Phosphatase 83 38 - 126 U/L   Total Bilirubin 1.4 (  H) 0.3 - 1.2 mg/dL   GFR calc non Af Amer >60 >60 mL/min   GFR calc Af Amer >60 >60 mL/min    Comment: (NOTE) The eGFR has been calculated using the CKD EPI equation. This calculation has not been validated in all clinical situations. eGFR's persistently <60 mL/min signify possible Chronic Kidney Disease.    Anion gap 11 5 - 15  CBC WITH DIFFERENTIAL     Status: Abnormal   Collection Time: 11/11/16  2:30 PM  Result Value Ref Range   WBC 7.0 4.0 - 10.5 K/uL   RBC 4.74 4.22 - 5.81 MIL/uL   Hemoglobin 14.4 13.0 - 17.0 g/dL   HCT 43.5 39.0 - 52.0 %   MCV 91.8 78.0 - 100.0 fL   MCH 30.4 26.0 - 34.0 pg   MCHC 33.1 30.0 - 36.0 g/dL   RDW 13.4 11.5 - 15.5 %   Platelets 108 (L) 150 - 400 K/uL    Comment: REPEATED TO VERIFY SPECIMEN CHECKED FOR CLOTS PLATELET COUNT CONFIRMED BY SMEAR    Neutrophils Relative % 73 %   Neutro Abs 5.1 1.7 - 7.7 K/uL   Lymphocytes Relative 12 %   Lymphs Abs 0.9 0.7 - 4.0 K/uL   Monocytes Relative 11 %   Monocytes Absolute 0.8 0.1 - 1.0 K/uL   Eosinophils Relative 3 %   Eosinophils Absolute 0.2 0.0 - 0.7 K/uL   Basophils Relative 0 %   Basophils Absolute 0.0 0.0 - 0.1 K/uL  Ethanol     Status: None   Collection Time: 11/11/16  2:30 PM  Result Value Ref Range   Alcohol, Ethyl (B) <5 <5 mg/dL    Comment:        LOWEST DETECTABLE LIMIT FOR SERUM ALCOHOL IS 5 mg/dL FOR MEDICAL PURPOSES ONLY   Salicylate level     Status: None   Collection Time: 11/11/16  2:30 PM  Result Value Ref Range   Salicylate Lvl  <3.8 2.8 - 30.0 mg/dL  Acetaminophen level     Status: Abnormal   Collection Time: 11/11/16  2:30 PM  Result Value Ref Range   Acetaminophen (Tylenol), Serum <10 (L) 10 - 30 ug/mL    Comment:        THERAPEUTIC CONCENTRATIONS VARY SIGNIFICANTLY. A RANGE OF 10-30 ug/mL MAY BE AN EFFECTIVE CONCENTRATION FOR MANY PATIENTS. HOWEVER, SOME ARE BEST TREATED AT CONCENTRATIONS OUTSIDE THIS RANGE. ACETAMINOPHEN CONCENTRATIONS >150 ug/mL AT 4 HOURS AFTER INGESTION AND >50 ug/mL AT 12 HOURS AFTER INGESTION ARE OFTEN ASSOCIATED WITH TOXIC REACTIONS.   I-Stat CG4 Lactic Acid, ED     Status: None   Collection Time: 11/11/16  2:45 PM  Result Value Ref Range   Lactic Acid, Venous 0.96 0.5 - 1.9 mmol/L  I-stat troponin, ED     Status: None   Collection Time: 11/11/16  4:22 PM  Result Value Ref Range   Troponin i, poc 0.04 0.00 - 0.08 ng/mL   Comment 3            Comment: Due to the release kinetics of cTnI, a negative result within the first hours of the onset of symptoms does not rule out myocardial infarction with certainty. If myocardial infarction is still suspected, repeat the test at appropriate intervals.   Urinalysis, Complete w Microscopic     Status: Abnormal   Collection Time: 11/11/16  5:06 PM  Result Value Ref Range   Color, Urine YELLOW YELLOW   APPearance CLEAR CLEAR   Specific Gravity,  Urine 1.016 1.005 - 1.030   pH 6.0 5.0 - 8.0   Glucose, UA NEGATIVE NEGATIVE mg/dL   Hgb urine dipstick MODERATE (A) NEGATIVE   Bilirubin Urine NEGATIVE NEGATIVE   Ketones, ur 5 (A) NEGATIVE mg/dL   Protein, ur NEGATIVE NEGATIVE mg/dL   Nitrite NEGATIVE NEGATIVE   Leukocytes, UA NEGATIVE NEGATIVE   RBC / HPF 6-30 0 - 5 RBC/hpf   WBC, UA 0-5 0 - 5 WBC/hpf   Bacteria, UA RARE (A) NONE SEEN   Squamous Epithelial / LPF 0-5 (A) NONE SEEN   Mucus PRESENT    Hyaline Casts, UA PRESENT   Urine rapid drug screen (hosp performed)     Status: None   Collection Time: 11/11/16  5:06 PM  Result  Value Ref Range   Opiates NONE DETECTED NONE DETECTED   Cocaine NONE DETECTED NONE DETECTED   Benzodiazepines NONE DETECTED NONE DETECTED   Amphetamines NONE DETECTED NONE DETECTED   Tetrahydrocannabinol NONE DETECTED NONE DETECTED   Barbiturates NONE DETECTED NONE DETECTED    Comment:        DRUG SCREEN FOR MEDICAL PURPOSES ONLY.  IF CONFIRMATION IS NEEDED FOR ANY PURPOSE, NOTIFY LAB WITHIN 5 DAYS.        LOWEST DETECTABLE LIMITS FOR URINE DRUG SCREEN Drug Class       Cutoff (ng/mL) Amphetamine      1000 Barbiturate      200 Benzodiazepine   212 Tricyclics       248 Opiates          300 Cocaine          300 THC              50   Protime-INR     Status: None   Collection Time: 11/11/16  8:12 PM  Result Value Ref Range   Prothrombin Time 14.9 11.4 - 15.2 seconds   INR 1.18   TSH     Status: None   Collection Time: 11/12/16  1:33 AM  Result Value Ref Range   TSH 1.491 0.350 - 4.500 uIU/mL    Comment: Performed by a 3rd Generation assay with a functional sensitivity of <=0.01 uIU/mL.  Lipid panel     Status: None   Collection Time: 11/12/16  1:33 AM  Result Value Ref Range   Cholesterol 159 0 - 200 mg/dL   Triglycerides 132 <150 mg/dL   HDL 41 >40 mg/dL   Total CHOL/HDL Ratio 3.9 RATIO   VLDL 26 0 - 40 mg/dL   LDL Cholesterol 92 0 - 99 mg/dL    Comment:        Total Cholesterol/HDL:CHD Risk Coronary Heart Disease Risk Table                     Men   Women  1/2 Average Risk   3.4   3.3  Average Risk       5.0   4.4  2 X Average Risk   9.6   7.1  3 X Average Risk  23.4   11.0        Use the calculated Patient Ratio above and the CHD Risk Table to determine the patient's CHD Risk.        ATP III CLASSIFICATION (LDL):  <100     mg/dL   Optimal  100-129  mg/dL   Near or Above  Optimal  130-159  mg/dL   Borderline  160-189  mg/dL   High  >190     mg/dL   Very High   CBC     Status: Abnormal   Collection Time: 11/12/16  3:43 AM  Result Value  Ref Range   WBC 7.8 4.0 - 10.5 K/uL   RBC 5.11 4.22 - 5.81 MIL/uL   Hemoglobin 15.8 13.0 - 17.0 g/dL   HCT 47.1 39.0 - 52.0 %   MCV 92.2 78.0 - 100.0 fL   MCH 30.9 26.0 - 34.0 pg   MCHC 33.5 30.0 - 36.0 g/dL   RDW 13.2 11.5 - 15.5 %   Platelets 113 (L) 150 - 400 K/uL    Comment: CONSISTENT WITH PREVIOUS RESULT  Protime-INR     Status: Abnormal   Collection Time: 11/12/16  3:43 AM  Result Value Ref Range   Prothrombin Time 15.4 (H) 11.4 - 15.2 seconds   INR 1.23   Heparin level (unfractionated)     Status: Abnormal   Collection Time: 11/12/16 10:09 AM  Result Value Ref Range   Heparin Unfractionated 0.22 (L) 0.30 - 0.70 IU/mL    Comment:        IF HEPARIN RESULTS ARE BELOW EXPECTED VALUES, AND PATIENT DOSAGE HAS BEEN CONFIRMED, SUGGEST FOLLOW UP TESTING OF ANTITHROMBIN III LEVELS.     Current Facility-Administered Medications  Medication Dose Route Frequency Provider Last Rate Last Dose  . amLODipine (NORVASC) tablet 10 mg  10 mg Oral Daily Wendee Beavers T, MD   10 mg at 11/12/16 1026  . heparin ADULT infusion 100 units/mL (25000 units/269m sodium chloride 0.45%)  1,150 Units/hr Intravenous Continuous CHonor Loh RPH 11.5 mL/hr at 11/12/16 0211 1,150 Units/hr at 11/12/16 0211  . LORazepam (ATIVAN) injection 0.5 mg  0.5 mg Intravenous Once TSela Hilding MD      . metoprolol tartrate (LOPRESSOR) tablet 25 mg  25 mg Oral BID GWendee BeaversT, MD   25 mg at 11/12/16 1026  . tamsulosin (FLOMAX) capsule 0.4 mg  0.4 mg Oral Daily GWendee BeaversT, MD   0.4 mg at 11/12/16 1027  . warfarin (COUMADIN) tablet 3-4.5 mg  3-4.5 mg Oral See admin instructions [place], J[place] DO        Musculoskeletal: Strength & Muscle Tone: decreased Gait & Station: unable to stand Patient leans: N/A  Psychiatric Specialty Exam: Physical Exam as per history and physical   ROS unable to perform due to current mental status   Blood pressure 132/90, pulse 67, temperature (!) 97.4 F (36.3 C),  temperature source Oral, resp. rate 19, height _0  (1.905 m), weight 79.4 kg (175 lb), SpO2 96 %.Body mass index is 21.87 kg/m.  General Appearance: Bizarre, Disheveled and Guarded  Eye Contact:  Fair  Speech:  Blocked, Garbled and Slow  Volume:  Decreased  Mood:  Depressed  Affect:  Depressed and Inappropriate  Thought Process:  Disorganized and Irrelevant  Orientation:  Other:  Patient is oriented to his first name and date of birth only.   Thought Content:  Illogical, Hallucinations: Visual, Rumination and Tangential  Suicidal Thoughts:  No  Homicidal Thoughts:  No  Memory:  Immediate;   Fair Recent;   Poor Remote;   Poor  Judgement:  Impaired  Insight:  Shallow  Psychomotor Activity:  Decreased  Concentration:  Concentration: Poor and Attention Span: Poor  Recall:  Poor  Fund of Knowledge:  Poor  Language:  Fair  Akathisia:  Negative  Handed:  Right  AIMS (if indicated):     Assets:  Financial Resources/Insurance Housing Leisure Time  ADL's:  Impaired  Cognition:  Impaired,  Moderate  Sleep:        Treatment Plan Summary: 76 years old male with no previous history of mental illness presented with altered mental status with the auditory/visual hallucinations and unable to care for himself. Patient was found talking to himself life neighbors. Patient has no family members who lives with him.   Daily contact with patient to assess and evaluate symptoms and progress in treatment and Medication management   Recommendation: We'll give a trial of risperidone 0.25 mg twice daily for hallucinations Patient does not meet criteria for capacity to make his own medical decisions and living arrangements  Refer to Unit CSW to contact family members for possible medical care power of attorney Appreciate psychiatric consultation and follow up as clinically required Please contact [phone] or [phone] if needs further assistance   Disposition: Patient will be referred to the  geriatric psychiatric hospitalization Referred to unit CSW for appropriate placement needs Recommend psychiatric Inpatient admission when medically cleared. Supportive therapy provided about ongoing stressors.  Ambrose [place], MD 11/12/2016 11:31 AM

## 2016-11-12 NOTE — Progress Notes (Signed)
ANTICOAGULATION CONSULT NOTE - Follow Up Consult  Pharmacy Consult for Heparin and Coumadin Indication: atrial fibrillation and mechanical AVR  Allergies  Allergen Reactions  . Lemon Oil Other (See Comments)    Only skin contact dermatitis   Patient Measurements: Height: 6\' 3"  (190.5 cm) Weight: 175 lb (79.4 kg) IBW/kg (Calculated) : 84.5 Heparin Dosing Weight: 79.4 kg  Assessment: 76 yo M presented with AMS, actively hallucinating, concern for Lewy Body dementia.  Noted to have subtherapeutic INR on admission, likely from noncompliance with medications with AMS.  Started on heparin infusion give mechanical AVR.  To also restart Coumadin today.    PTA dose warfarin: 1.5 mg TuThSat and 3mg  all other days (per [facility] visit on 8/7)  Heparin level is now therapeutic.  Goal of Therapy:  INR 2.5-3.5 for mechanical AVR + Afib Heparin level 0.3-0.7 units/ml Monitor platelets by anticoagulation protocol: Yes   Plan:  Continue heparin gtt at 1,350 units/hr Monitor daily heparin level, CBC, s/s of bleed  Elenor Quinones, PharmD, [facility] Clinical Pharmacist Pager [phone] 11/12/2016 8:41 PM

## 2016-11-12 NOTE — ED Notes (Signed)
Attempted report 

## 2016-11-13 DIAGNOSIS — R443 Hallucinations, unspecified: Secondary | ICD-10-CM

## 2016-11-13 DIAGNOSIS — R4182 Altered mental status, unspecified: Secondary | ICD-10-CM | POA: Diagnosis not present

## 2016-11-13 LAB — CBC
HEMATOCRIT: 46.2 % (ref 39.0–52.0)
Hemoglobin: 15.9 g/dL (ref 13.0–17.0)
MCH: 30.9 pg (ref 26.0–34.0)
MCHC: 34.4 g/dL (ref 30.0–36.0)
MCV: 89.7 fL (ref 78.0–100.0)
Platelets: 105 10*3/uL — ABNORMAL LOW (ref 150–400)
RBC: 5.15 MIL/uL (ref 4.22–5.81)
RDW: 13 % (ref 11.5–15.5)
WBC: 7.7 10*3/uL (ref 4.0–10.5)

## 2016-11-13 LAB — PROTIME-INR
INR: 1.25
Prothrombin Time: 15.6 seconds — ABNORMAL HIGH (ref 11.4–15.2)

## 2016-11-13 LAB — HEPARIN LEVEL (UNFRACTIONATED): HEPARIN UNFRACTIONATED: 0.36 [IU]/mL (ref 0.30–0.70)

## 2016-11-13 MED ORDER — WARFARIN SODIUM 5 MG PO TABS
5.0000 mg | ORAL_TABLET | Freq: Once | ORAL | Status: AC
  Administered 2016-11-13: 5 mg via ORAL
  Filled 2016-11-13: qty 1

## 2016-11-13 NOTE — Evaluation (Signed)
Occupational Therapy Evaluation Patient Details Name: Juan Wilkerson MRN: [id number] DOB: 03-08-1941 Today's Date: 11/13/2016    History of Present Illness Juan Wilkerson is a 76 y.o. male presenting with altered mental status. PMH is significant for atrial flutter/fibrillation on warfarin, hypertension, hyperlipidemia, ?Marfan's syndrome, Aortic valve replacement, microscopic hematuria, left renal mass   Clinical Impression   PTA Pt independent in ADL and mobility, pt poor historian. Pt is currently mod A for seated ADL and mod A +2 for stand pivot transfers with strong posterior lean. Pt seeing green lights during session, and only oriented to name and DOB. Pt unable to name toothbrush or pen (even with choices provided)Pt will benefit from skilled OT in the acute setting to maximize safety and independence in ADL and cognitive function prior to dc to Psych facility (per MD note) and would like to see Pt engage in meaningful occupations. 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 OT will continue to follow.    Follow Up Recommendations  SNF;Supervision/Assistance - 24 hour    Equipment Recommendations  Other (comment) (defer to next venue)    Recommendations for Other Services       Precautions / Restrictions Precautions Precautions: Fall;Other (comment) (wrist restraints, mitts) Restrictions Weight Bearing Restrictions: No      Mobility Bed Mobility Overal bed mobility: Needs Assistance Bed Mobility: Supine to Sit;Sit to Supine     Supine to sit: Mod assist;HOB elevated Sit to supine: Min guard;HOB elevated   General bed mobility comments: mod A for trunk elevation and assist to bring hips to EOB with pad, able to get back in bed with no physical assist but cues needed for sequencing  Transfers Overall transfer level: Needs assistance Equipment used: 2 person hand held assist Transfers: Sit to/from [facility] Sit to Stand: Mod assist;+2  physical assistance;+2 safety/equipment Stand pivot transfers: Mod assist;+2 physical assistance;+2 safety/equipment       General transfer comment: strong posterior lean    Balance Overall balance assessment: Needs assistance Sitting-balance support: No upper extremity supported;Feet supported Sitting balance-Leahy Scale: Fair Sitting balance - Comments: sitting EOB with no back support Postural control: Posterior lean (in standing) Standing balance support: Bilateral upper extremity supported;During functional activity Standing balance-Leahy Scale: Poor Standing balance comment: reliant on external support                           ADL either performed or assessed with clinical judgement   ADL Overall ADL's : Needs assistance/impaired     Grooming: Moderate assistance;Wash/dry face;Sitting   Upper Body Bathing: Maximal assistance;Sitting   Lower Body Bathing: Maximal assistance;Bed level   Upper Body Dressing : Moderate assistance   Lower Body Dressing: Maximal assistance   Toilet Transfer: Moderate assistance;+2 for physical assistance;+2 for safety/equipment;Stand-pivot Toilet Transfer Details (indicate cue type and reason): strong posterior lean         Functional mobility during ADLs: Moderate assistance;+2 for physical assistance;+2 for safety/equipment;Cueing for safety;Cueing for sequencing General ADL Comments: Pt confused and required mitts per RN as he has been combative.     Vision   Additional Comments: not tested this session due to cognition     Perception     Praxis Praxis Praxis tested?: Deficits Deficits: Ideation;Perseveration    Pertinent Vitals/Pain Pain Assessment: No/denies pain     Hand Dominance     Extremity/Trunk Assessment Upper Extremity Assessment Upper Extremity Assessment: Generalized weakness  Lower Extremity Assessment Lower Extremity Assessment: Defer to PT evaluation   Cervical / Trunk  Assessment Cervical / Trunk Assessment: Kyphotic   Communication Communication Communication: No difficulties   Cognition Arousal/Alertness: Awake/alert Behavior During Therapy: Restless Overall Cognitive Status: No family/caregiver present to determine baseline cognitive functioning                                 General Comments: Pt new name and birthday, did not know where he was, what was going on, and could not identify common objects (toothbrush and pen - even when given choices)   General Comments       Exercises     Shoulder Instructions      Home Living Family/patient expects to be discharged to:: Other (Comment)                                 Additional Comments: Psych Support      Prior Functioning/Environment Level of Independence: Independent with assistive device(s)        Comments: Pt stated "sometimes I used a cane"        OT Problem List: Decreased strength;Decreased range of motion;Decreased activity tolerance;Impaired balance (sitting and/or standing);Decreased cognition;Decreased safety awareness;Decreased knowledge of use of DME or AE      OT Treatment/Interventions: Self-care/ADL training;DME and/or AE instruction;Therapeutic activities;Cognitive remediation/compensation;Patient/family education;Balance training    OT Goals(Current goals can be found in the care plan section) Acute Rehab OT Goals Patient Stated Goal: none stated OT Goal Formulation: Patient unable to participate in goal setting Time For Goal Achievement: 11/27/16 Potential to Achieve Goals: Good ADL Goals Pt Will Perform Grooming: with min guard assist;standing Pt Will Perform Upper Body Bathing: with set-up;sitting Pt Will Perform Lower Body Bathing: with set-up;sitting/lateral leans Pt Will Transfer to Toilet: with min guard assist;ambulating;regular height toilet Pt Will Perform Toileting - Clothing Manipulation and hygiene: with  supervision;sit to/from stand Additional ADL Goal #1: Pt will perform bed mobility at supervision level prior to engaging in ADL activity  OT Frequency: Min 1X/week   Barriers to D/C: Decreased caregiver support  pt lives alone       Co-evaluation              AM-PAC PT "6 Clicks" Daily Activity     Outcome Measure Help from another person eating meals?: A Lot Help from another person taking care of personal grooming?: A Lot Help from another person toileting, which includes using toliet, bedpan, or urinal?: A Lot Help from another person bathing (including washing, rinsing, drying)?: A Lot Help from another person to put on and taking off regular upper body clothing?: A Lot Help from another person to put on and taking off regular lower body clothing?: A Lot 6 Click Score: 12   End of Session Equipment Utilized During Treatment: Gait belt Nurse Communication: Mobility status;Precautions;Other (comment) (on bed pan)  Activity Tolerance: Patient tolerated treatment well Patient left: in bed;with call bell/phone within reach;with bed alarm set;with restraints reapplied  OT Visit Diagnosis: Unsteadiness on feet (R26.81);Other abnormalities of gait and mobility (R26.89);Repeated falls (R29.6);Muscle weakness (generalized) (M62.81);Other symptoms and signs involving cognitive function                Time: 5573-2202 OT Time Calculation (min): 32 min Charges:  OT General Charges $OT Visit: 1 Visit OT Evaluation $OT Eval Moderate Complexity:  1 Mod OT Treatments $Self Care/Home Management : 8-22 mins G-Codes: OT G-codes **NOT FOR INPATIENT CLASS** Functional Assessment Tool Used: AM-PAC 6 Clicks Daily Activity Functional Limitation: Self care Self Care Current Status (X5072): At least 60 percent but less than 80 percent impaired, limited or restricted Self Care Goal Status (U5750): At least 20 percent but less than 40 percent impaired, limited or restricted Self Care Discharge  Status [phone]): At least 60 percent but less than 80 percent impaired, limited or restricted   Hulda Humphrey OTR/L [place] 11/13/2016, 3:52 PM

## 2016-11-13 NOTE — Discharge Summary (Signed)
[facility] Discharge Summary  Patient name: Juan Wilkerson Medical record number: [id number] Date of birth: 1940/09/30 Age: 76 y.o. Gender: male Date of Admission: 11/11/2016  Date of Discharge: 12/02/16 Admitting Physician: Dickie La, MD  Primary Care Provider: Lovenia Kim, MD Consultants: Psychiatry   Indication for Hospitalization: Altered mental status/audiovisual hallucination  Discharge Diagnoses/Problem List:  Urosepsis, resolved Altered mental status Atrial flutter/fibrillation without RVR Prosthetic aortic valve Hypertension Acute Urinary Retention Hyperlipidemia Microscopic hematuria BPH  Disposition: SNF  Discharge Condition: stable  Discharge Exam:  General: NAD, laying comfortably in bed, awake and alert HEENT: moist mucous membranes Cardiovascular: Irregular rhythm, normal rate, loud S2 Respiratory: CTAB, normal WOB Abdomen: soft, nontender, nondistended, + bowel sounds. Extremities: no LE edema. Neuro: Alert and oriented to self only. Not oriented to time. Psych: speech with confabulation. No agitation.   Brief Hospital Course:  Juan Wilkerson is a 76 y.o. male who presented with altered mental status with audiovisual hallucinations. PMH is significant for atrial flutter/fibrillation on warfarin, hypertension, hyperlipidemia, Marfan's syndrome, aortic valve replacement, microscopic hematuria, left renal mass. Patient was seen by psychiatry after admission and it was recommended that he go to an inpatient psychiatric facility and was started on Risperdal for hallucinations.   While awaiting placement, patient declined 09/11 and developed fever of 100.6 with increased respirations meeting sepsis criteria. WBC count increased from 8.3> 14.8 on 09/11. Serial troponins were negative. CXR on 09/11 for workup showed: Subtle increased density in the left suprahilar region with no recent CXR to compare, findings could reflect developing upper  lobe pneumonia. Patient was initially started on vanc for 2 days(9/11-9/12) which was discontinued due to negative MRSA PCR and Cefepime (9/11-9/13).   Patient improved after placement of foley for acute urinary retention, as well as disimpaction of bowels, and was moved back to the floor from step down. Blood cultures were negative and urine culture grew Klebsiella and Citrobacter- placed on PO Cefixime(9/14-20) for 10 day coarse of treatment. Patient failed multiple voiding trials and was started on finasteride and flomax dosage was increased. His metoprolol was stopped due to low blood pressures. He was started on digoxin for rate control of his Afib.   Issues for Follow Up:  1. AMS: Likely due to Lewy Body dementia. No treatment at this time. Minimalized medications while admitted. Will need to contact brother for further care, had difficulty reaching brother while he was admitted.  2. Urosepsis: Urine culture grew Klebsiella and Citrobacter. Finished 10 day antibiotic treatment. Resolved. 3. Atrial flutter/fibrillation without RVR/ Prosthetic aortic valve: Continue coumadin with a goal INR range of 2.5-3.5, target of 3.0. On digoxin for rate control. 4. Acute Urinary Retention/ BPH: Patient started on finasteride, and flomax dosage increased to 0.8 mg daily. Continue to monitor. Attempt voiding trial again and consider urology consult.  5. Constipation: Patient had to manually disimpacted twice while admitted and was started on Miralax and senna- docusate. Continue to monitor and stop senna-docusate when able, having bowel movements on discharge. 6. HTN: Discontinued amlodipine and metoprolol.  7. HLD: Stopped home statin while admitted due to risk vs benefit from risk stratification with progressing dementia.   Significant Procedures: none  Significant Labs and Imaging:   Recent Labs Lab 11/26/16 0406 11/28/16 0450 11/29/16 0524  WBC 6.4 9.5 9.3  HGB 15.0 15.0 15.0  HCT 46.3 45.0 45.3   PLT 156 134* 135*    Recent Labs Lab 12/01/16 0523  NA 137  K 3.6  CL 105  CO2 25  GLUCOSE 95  BUN 17  CREATININE 0.76  CALCIUM 8.9    Results/Tests Pending at Time of Discharge: none  Discharge Medications:  Allergies as of 12/02/2016      Reactions   Lemon Oil Other (See Comments)   Only skin contact dermatitis      Medication List    STOP taking these medications   amLODipine 10 MG tablet Commonly known as:  NORVASC   metoprolol succinate 50 MG 24 hr tablet Commonly known as:  TOPROL-XL   pravastatin 40 MG tablet Commonly known as:  PRAVACHOL     TAKE these medications   Digoxin 62.5 MCG Tabs Take 0.0625 mg by mouth daily.   finasteride 5 MG tablet Commonly known as:  PROSCAR Take 1 tablet (5 mg total) by mouth daily.   fluticasone 50 MCG/ACT nasal spray Commonly known as:  FLONASE Place 2 sprays into both nostrils daily.   loratadine 10 MG tablet Commonly known as:  CLARITIN Take 10 mg by mouth daily.   multivitamin capsule Take 1 capsule by mouth every morning.   polyethylene glycol packet Commonly known as:  MIRALAX / GLYCOLAX Take 17 g by mouth daily.   senna-docusate 8.6-50 MG tablet Commonly known as:  Senokot-S Take 1 tablet by mouth 2 (two) times daily.   tamsulosin 0.4 MG Caps capsule Commonly known as:  FLOMAX Take 2 capsules (0.8 mg total) by mouth daily. What changed:  how much to take   warfarin 3 MG tablet Commonly known as:  COUMADIN TAKE 1 TABLET BY MOUTH EVERY DAY BUT SATURDAY ON SATURDAY TAKE 1 & 1/2 TABLET What changed:  how much to take  how to take this  when to take this  additional instructions            Discharge Care Instructions        Start     Ordered   12/03/16 0000  digoxin 62.5 MCG TABS  Daily     12/02/16 1350   12/03/16 0000  finasteride (PROSCAR) 5 MG tablet  Daily     12/02/16 1350   12/03/16 0000  polyethylene glycol (MIRALAX / GLYCOLAX) packet  Daily     12/02/16 1350    12/03/16 0000  tamsulosin (FLOMAX) 0.4 MG CAPS capsule  Daily     12/02/16 1350   12/02/16 0000  senna-docusate (SENOKOT-S) 8.6-50 MG tablet  2 times daily     12/02/16 1350      Discharge Instructions: Please refer to Patient Instructions section of EMR for full details.  Patient was counseled important signs and symptoms that should prompt return to medical care, changes in medications, dietary instructions, activity restrictions, and follow up appointments.   Follow-Up Appointments:   Aleria Maheu, [place], DO 12/02/2016, 1:54 PM PGY-1, [place]

## 2016-11-13 NOTE — Progress Notes (Signed)
Family Medicine Teaching Service Daily Progress Note Intern Pager: [phone]  Patient name: Juan Wilkerson Medical record number: [id number] Date of birth: [birth year] Age: 76 y.o. Gender: male  Primary Care Provider: Lovenia Kim, MD Consultants: Psych Code Status: Full  Pt Overview and Major Events to Date:  Juan Wilkerson is a 76 y.o. male presenting with altered mental status. PMH is significant for atrial flutter/fibrillation on warfarin, hypertension, hyperlipidemia, Marfan's syndrome, Aortic valve replacement, microscopic hematuria, left renal mass  Assessment and Plan:  Altered mental status: not clear if this is a progression of chronic issue or acute on chronic. No focal neuro deficits noted to suggest CVA. UDS, EtOH level, Tylenol level, salicylate level all wnl. History exam and lab findings not suggestive for infectious etiology. The audiovisual hallucination appears to be new per patient's neighbor who talked to ED provider. However this may have been gradual onset over the past year. He is alert and awake but oriented only to self. He has no psychiatric history and problem list. Not on medication that could cause AMS. Rule out Lewy Body dementia. -Monitor neuro status -We'll obtain MMSE down the road -Avoid sedating medications -TSH 1.491 - Obtain MRI brain without contrast- attempted but patient not able to have test, will give meds as needed for sedation - Sitter for patient being restless in room- given haldol x2 - Psych consult- recommends admitting to psych unit, trial of risperidone 0.25 mg twice daily for hallucinations  Atrial flutter/fibrillation without RVR: Appears to be on metoprolol succinate 50 mg daily and warfarin 3 mg daily. PT/INR subtherapeutic. Chadvasc score 3 (htn and age) -Heparin per pharmacy -Metoprolol 25 mg twice a day -Resume warfarin given history of aortic valve  Prosthetic aortic valve: On warfarin at home but INR subtherapeutic. Followed by Dr.  Stanford Breed.  -Heparin as above, restarted home dosage of warfarin and will d/c heparin once he is at therapeutic levels. -Consider cardiology consult  Thrombocytopenia: PLT on admission 108> 105 on 09/07 - Monitor on CBC  Hypertension: Stable. Normotensive. Last BP 127/87 -Metoprolol as above - Continue home Amlodipine   Hyperlipidemia: On pravastatin at home -Holding home atorvastatin  -Lipid panel: Tot chol 159, LDL 92, HDL 41  Microscopic hematuria: Seems to be a chronic issue -Outpatient workup/urology  BPH: On Flomax at home. Has about 300 mL normal-looking urine in a urinal at bedside -PVR pending -Continue home Flomax  FEN/GI: Heart healthy diet Prophylaxis: Heparin gtt  Disposition: Psych admission  Subjective:  Patient more altered this morning. No sitter in the room due to staffing. Has soft glove on.   Objective: Temp:  [98.1 F (36.7 C)-98.3 F (36.8 C)] 98.3 F (36.8 C) (09/07 0454) Pulse Rate:  [58-81] 58 (09/07 0454) Resp:  [15-18] 18 (09/07 0454) BP: (127-149)/(86-90) 127/87 (09/07 0454) SpO2:  [97 %-99 %] 99 % (09/07 0454) Physical Exam: General: NAD, pleasant, confused Cardiovascular: Irregular rhythm, regular rate, S2 louder than S1, no murmurs, no edema Respiratory: CTA BL, normal work of breathing Abdomen: non-distended Skin: 1" x 1" non-tender subcutaneous cyst noted over left mid back- no draining erythema or purulence noted Extremities: Moves all 4 extremties Neuro: Awake and alert, oriented only to self. Psych: Thought process is unclear. Not able to maintain train of thought and unable to concentrate on questions.   Laboratory:  Recent Labs Lab 11/11/16 1430 11/12/16 0343 11/13/16 0613  WBC 7.0 7.8 7.7  HGB 14.4 15.8 15.9  HCT 43.5 47.1 46.2  PLT 108* 113* 105*  Recent Labs Lab 11/11/16 1430  NA 140  K 3.8  CL 106  CO2 23  BUN 20  CREATININE 0.98  CALCIUM 9.2  PROT 6.7  BILITOT 1.4*  ALKPHOS 83  ALT 13*  AST  21  GLUCOSE 107*   UDS -, EtOH <5, Salicylate < 7, Acetaminophen <10 TSH 1.491 PT 15.4/ INR 1.23  Imaging/Diagnostic Tests: Ct Head Wo Contrast  Result Date: 11/11/2016 CLINICAL DATA:  Altered mental status, visual and auditory hallucinations. History of hypertension, Marfan's syndrome. EXAM: CT HEAD WITHOUT CONTRAST TECHNIQUE: Contiguous axial images were obtained from the base of the skull through the vertex without intravenous contrast. COMPARISON:  MRI of the head May 11, 2013 FINDINGS: BRAIN: No intraparenchymal hemorrhage, mass effect nor midline shift. The ventricles and sulci are normal for age. Patchy supratentorial white matter hypodensities less than expected for patient's age, though non-specific are most compatible with chronic small vessel ischemic disease. Old small LEFT cerebellar infarct. No acute large vascular territory infarcts. No abnormal extra-axial fluid collections. Basal cisterns are patent. VASCULAR: Trace calcific atherosclerosis of the carotid siphons. SKULL: No skull fracture. No significant scalp soft tissue swelling. SINUSES/ORBITS: Mild RIGHT ethmoid mucosal thickening without air-fluid levels. Mastoid air cells are well aerated.The included ocular globes and orbital contents are non-suspicious. OTHER: None. IMPRESSION: 1. No acute intracranial process. 2. Stable examination: Old small LEFT cerebellar infarct, otherwise negative noncontrast CT HEAD for age. Electronically Signed   By: Elon Alas M.D.   On: 11/11/2016 17:33    Robbi Spells, [place], DO 11/13/2016, 9:35 AM PGY-1, [place] Intern pager: [phone], text pages welcome

## 2016-11-13 NOTE — Progress Notes (Addendum)
Patient continues to be undirectable, trying to hit staff, and still trying to get out of bed even with bilateral wrist restraints on.  Since wrist restraints were applied at 1715, patient has continuously pulled on restraints violently and forcefully. On the inner aspect of the patient's each wrist, the patient's skin is beginning to turn pink. Both of his inner wrists are pink but blanchable and the patient is not complaining of any pain. Will continue to monitor and treat per MD orders.

## 2016-11-13 NOTE — Evaluation (Addendum)
Physical Therapy Evaluation Patient Details Name: Juan Wilkerson MRN: [id number] DOB: [birth year] Today's Date: 11/13/2016   History of Present Illness  Juan Wilkerson is a 76 y.o. male presenting with altered mental status. PMH is significant for atrial flutter/fibrillation on warfarin, hypertension, hyperlipidemia, ?Marfan's syndrome, Aortic valve replacement, microscopic hematuria, left renal mass  Clinical Impression  Pt admitted with/for AMS.  Pt needs min to mod assist to mobilize safely in a home-like environment mostly due to decreased ability to focus on task, make sound choices and negotiate hazards..  Pt currently limited functionally due to the problems listed. ( See problems list.)   Pt will benefit from PT to maximize function and safety in order to get ready for next venue listed below.     Follow Up Recommendations SNF    Equipment Recommendations  Other (comment) (TBA)    Recommendations for Other Services       Precautions / Restrictions Precautions Precautions: Fall;Other (comment) (wrist restraints, mitts) Restrictions Weight Bearing Restrictions: No      Mobility  Bed Mobility Overal bed mobility: Needs Assistance Bed Mobility: Supine to Sit     Supine to sit: Min assist;HOB elevated Sit to supine: Min guard;HOB elevated   General bed mobility comments: mod A for trunk elevation and assist to bring hips to EOB with pad, able to get back in bed with no physical assist but cues needed for sequencing  Transfers Overall transfer level: Needs assistance Equipment used: 2 person hand held assist;1 person hand held assist Transfers: Sit to/from Stand Sit to Stand: Mod assist (from normal height or elevated surface) Stand pivot transfers: Mod assist;+2 physical assistance;+2 safety/equipment       General transfer comment: moderate posterior lean  Ambulation/Gait Ambulation/Gait assistance: Min assist Ambulation Distance (Feet): 15 Feet (then 20  feet) Assistive device:  (pushing iv pole) Gait Pattern/deviations: Step-through pattern   Gait velocity interpretation: Below normal speed for age/gender General Gait Details: fairly normalized gait, mildly unsteady overall  Stairs            Wheelchair Mobility    Modified Rankin (Stroke Patients Only)       Balance Overall balance assessment: Needs assistance Sitting-balance support: No upper extremity supported;Feet supported Sitting balance-Leahy Scale: Fair Sitting balance - Comments: sitting EOB with no back support Postural control: Posterior lean (in standing) Standing balance support: During functional activity;Single extremity supported Standing balance-Leahy Scale: Poor Standing balance comment: reliant on external support                             Pertinent Vitals/Pain Pain Assessment: No/denies pain    Home Living Family/patient expects to be discharged to:: Other (Comment)                 Additional Comments: Psych Support    Prior Function Level of Independence: Independent with assistive device(s)         Comments: Pt stated "sometimes I used a cane"     Hand Dominance        Extremity/Trunk Assessment   Upper Extremity Assessment Upper Extremity Assessment: Defer to OT evaluation    Lower Extremity Assessment Lower Extremity Assessment: Overall WFL for tasks assessed (bil proximal weakness, but functional strength)    Cervical / Trunk Assessment Cervical / Trunk Assessment: Kyphotic  Communication   Communication: No difficulties  Cognition Arousal/Alertness: Awake/alert Behavior During Therapy: Restless Overall Cognitive Status: No family/caregiver present to determine baseline  cognitive functioning                                 General Comments: Pt knew name and birthday, did not know where he was, what was going on, and could not identify common objects (toothbrush and pen - even when  given choices)      General Comments General comments (skin integrity, edema, etc.): pt needing vc and physical cues to stay on task.  Rarely is pt in the moment or focused on what is going on    Exercises     Assessment/Plan    PT Assessment Patient needs continued PT services  PT Problem List Decreased strength;Decreased activity tolerance;Decreased balance;Decreased mobility;Decreased cognition       PT Treatment Interventions Gait training;DME instruction;Functional mobility training;Therapeutic activities;Balance training;Patient/family education    PT Goals (Current goals can be found in the Care Plan section)  Acute Rehab PT Goals Patient Stated Goal: none stated PT Goal Formulation: Patient unable to participate in goal setting Time For Goal Achievement: 11/27/16 Potential to Achieve Goals: Fair    Frequency Min 3X/week   Barriers to discharge Decreased caregiver support      Co-evaluation               AM-PAC PT "6 Clicks" Daily Activity  Outcome Measure Difficulty turning over in bed (including adjusting bedclothes, sheets and blankets)?: Unable Difficulty moving from lying on back to sitting on the side of the bed? : Unable Difficulty sitting down on and standing up from a chair with arms (e.g., wheelchair, bedside commode, etc,.)?: Unable Help needed moving to and from a bed to chair (including a wheelchair)?: A Lot Help needed walking in hospital room?: A Little Help needed climbing 3-5 steps with a railing? : A Lot 6 Click Score: 10    End of Session   Activity Tolerance: Patient tolerated treatment well Patient left: in chair;with call bell/phone within reach;with chair alarm set;with restraints reapplied (mitts) Nurse Communication: Mobility status PT Visit Diagnosis: Unsteadiness on feet (R26.81);Other abnormalities of gait and mobility (R26.89);Other (comment) (decreased cognition)    Time: 4854-6270 PT Time Calculation (min) (ACUTE ONLY):  34 min   Charges:   PT Evaluation $PT Eval Moderate Complexity: 1 Mod PT Treatments $Gait Training: 8-22 mins   PT G Codes:   PT G-Codes **NOT FOR INPATIENT CLASS** Functional Assessment Tool Used: AM-PAC 6 Clicks Basic Mobility;Clinical judgement Functional Limitation: Mobility: Walking and moving around Mobility: Walking and Moving Around Current Status (J5009): At least 20 percent but less than 40 percent impaired, limited or restricted Mobility: Walking and Moving Around Goal Status [id number]): At least 1 percent but less than 20 percent impaired, limited or restricted    11/13/2016  Donnella Sham, PT [phone]  (pager)  Tessie Fass Jarelly Rinck 11/13/2016, 4:35 PM

## 2016-11-13 NOTE — Progress Notes (Signed)
ANTICOAGULATION CONSULT NOTE - Follow Up Consult  Pharmacy Consult for Heparin and Coumadin Indication: atrial fibrillation and mechanical AVR  Allergies  Allergen Reactions  . Lemon Oil Other (See Comments)    Only skin contact dermatitis    Patient Measurements: Height: 6\' 3"  (190.5 cm) Weight: 175 lb (79.4 kg) IBW/kg (Calculated) : 84.5 Heparin Dosing Weight: 79.4 kg  Vital Signs: Temp: 98.3 F (36.8 C) (09/07 0454) Temp Source: Oral (09/07 0454) BP: 127/87 (09/07 0454) Pulse Rate: 58 (09/07 0454)  Labs:  Recent Labs  11/11/16 1430 11/11/16 2012 11/12/16 0343 11/12/16 1009 11/12/16 1916 11/13/16 0613  HGB 14.4  --  15.8  --   --  15.9  HCT 43.5  --  47.1  --   --  46.2  PLT 108*  --  113*  --   --  105*  LABPROT  --  14.9 15.4*  --   --  15.6*  INR  --  1.18 1.23  --   --  1.25  HEPARINUNFRC  --   --   --  0.22* 0.45 0.36  CREATININE 0.98  --   --   --   --   --     Estimated Creatinine Clearance: 70.9 mL/min (by C-G formula based on SCr of 0.98 mg/dL).   Medications:  Scheduled:  . amLODipine  10 mg Oral Daily  . LORazepam  0.5 mg Intravenous Once  . metoprolol tartrate  25 mg Oral BID  . risperiDONE  0.25 mg Oral BID  . tamsulosin  0.4 mg Oral Daily  . Warfarin - Pharmacist Dosing Inpatient   Does not apply q1800   Infusions:  . heparin 1,350 Units/hr (11/13/16 0229)    Assessment: 76 yo M presented with AMS, actively hallucinating, concern for Lewy Body dementia.  Noted to have subtherapeutic INR on admission, likely from noncompliance with medications with AMS.  Started on heparin infusion give mechanical AVR.  Bridging until INR therapeutic.  Heparin level is therapeutic on 1350 units/hr.  PTA dose warfarin: 1.5 mg TuThSat and 3mg  all other days (per [facility] visit on 8/7)  Goal of Therapy:  INR 2.5-3.5 for mechanical AVR + Afib Heparin level 0.3-0.7 units/ml Monitor platelets by anticoagulation protocol: Yes   Plan:  Increase heparin to 1400  units/hr  Daily heparin level and CBC Coumadin 5mg  PO x 1 tonight Daily INR Monitor for s/s bleeding  [employer], Pharm.D., BCPS Clinical Pharmacist Pager: [phone] Clinical phone for 11/13/2016 from 8:30-4:00 is [phone]. After 4pm, please call Main Rx [phone]) for assistance. 11/13/2016 11:52 AM

## 2016-11-13 NOTE — Progress Notes (Signed)
Patient refused his po medicine this AM. MD made aware. Will continue to monitor.

## 2016-11-14 DIAGNOSIS — E861 Hypovolemia: Secondary | ICD-10-CM | POA: Diagnosis present

## 2016-11-14 DIAGNOSIS — B961 Klebsiella pneumoniae [K. pneumoniae] as the cause of diseases classified elsewhere: Secondary | ICD-10-CM | POA: Diagnosis present

## 2016-11-14 DIAGNOSIS — F05 Delirium due to known physiological condition: Secondary | ICD-10-CM | POA: Diagnosis not present

## 2016-11-14 DIAGNOSIS — Z7901 Long term (current) use of anticoagulants: Secondary | ICD-10-CM | POA: Diagnosis not present

## 2016-11-14 DIAGNOSIS — E785 Hyperlipidemia, unspecified: Secondary | ICD-10-CM | POA: Diagnosis present

## 2016-11-14 DIAGNOSIS — N390 Urinary tract infection, site not specified: Secondary | ICD-10-CM | POA: Diagnosis not present

## 2016-11-14 DIAGNOSIS — F0281 Dementia in other diseases classified elsewhere with behavioral disturbance: Secondary | ICD-10-CM | POA: Diagnosis not present

## 2016-11-14 DIAGNOSIS — R338 Other retention of urine: Secondary | ICD-10-CM | POA: Diagnosis present

## 2016-11-14 DIAGNOSIS — R410 Disorientation, unspecified: Secondary | ICD-10-CM | POA: Diagnosis not present

## 2016-11-14 DIAGNOSIS — A419 Sepsis, unspecified organism: Secondary | ICD-10-CM | POA: Diagnosis not present

## 2016-11-14 DIAGNOSIS — I712 Thoracic aortic aneurysm, without rupture: Secondary | ICD-10-CM | POA: Diagnosis present

## 2016-11-14 DIAGNOSIS — R791 Abnormal coagulation profile: Secondary | ICD-10-CM | POA: Diagnosis present

## 2016-11-14 DIAGNOSIS — E7800 Pure hypercholesterolemia, unspecified: Secondary | ICD-10-CM | POA: Diagnosis present

## 2016-11-14 DIAGNOSIS — F0280 Dementia in other diseases classified elsewhere without behavioral disturbance: Secondary | ICD-10-CM | POA: Diagnosis not present

## 2016-11-14 DIAGNOSIS — E860 Dehydration: Secondary | ICD-10-CM | POA: Diagnosis present

## 2016-11-14 DIAGNOSIS — Z23 Encounter for immunization: Secondary | ICD-10-CM | POA: Diagnosis present

## 2016-11-14 DIAGNOSIS — K5900 Constipation, unspecified: Secondary | ICD-10-CM | POA: Diagnosis present

## 2016-11-14 DIAGNOSIS — G3183 Dementia with Lewy bodies: Secondary | ICD-10-CM | POA: Diagnosis present

## 2016-11-14 DIAGNOSIS — E440 Moderate protein-calorie malnutrition: Secondary | ICD-10-CM | POA: Diagnosis not present

## 2016-11-14 DIAGNOSIS — I4891 Unspecified atrial fibrillation: Secondary | ICD-10-CM | POA: Diagnosis present

## 2016-11-14 DIAGNOSIS — L89151 Pressure ulcer of sacral region, stage 1: Secondary | ICD-10-CM | POA: Diagnosis present

## 2016-11-14 DIAGNOSIS — R140 Abdominal distension (gaseous): Secondary | ICD-10-CM | POA: Diagnosis not present

## 2016-11-14 DIAGNOSIS — Z952 Presence of prosthetic heart valve: Secondary | ICD-10-CM

## 2016-11-14 DIAGNOSIS — E876 Hypokalemia: Secondary | ICD-10-CM | POA: Diagnosis present

## 2016-11-14 DIAGNOSIS — I10 Essential (primary) hypertension: Secondary | ICD-10-CM | POA: Diagnosis present

## 2016-11-14 DIAGNOSIS — N179 Acute kidney failure, unspecified: Secondary | ICD-10-CM | POA: Diagnosis not present

## 2016-11-14 DIAGNOSIS — R3129 Other microscopic hematuria: Secondary | ICD-10-CM | POA: Diagnosis present

## 2016-11-14 DIAGNOSIS — D696 Thrombocytopenia, unspecified: Secondary | ICD-10-CM | POA: Diagnosis present

## 2016-11-14 DIAGNOSIS — R443 Hallucinations, unspecified: Secondary | ICD-10-CM | POA: Diagnosis not present

## 2016-11-14 DIAGNOSIS — I4892 Unspecified atrial flutter: Secondary | ICD-10-CM | POA: Diagnosis present

## 2016-11-14 DIAGNOSIS — R440 Auditory hallucinations: Secondary | ICD-10-CM | POA: Diagnosis present

## 2016-11-14 DIAGNOSIS — M25512 Pain in left shoulder: Secondary | ICD-10-CM | POA: Diagnosis present

## 2016-11-14 DIAGNOSIS — N401 Enlarged prostate with lower urinary tract symptoms: Secondary | ICD-10-CM | POA: Diagnosis present

## 2016-11-14 DIAGNOSIS — R4182 Altered mental status, unspecified: Secondary | ICD-10-CM | POA: Diagnosis present

## 2016-11-14 LAB — HEPARIN LEVEL (UNFRACTIONATED)
HEPARIN UNFRACTIONATED: 1.07 [IU]/mL — AB (ref 0.30–0.70)
Heparin Unfractionated: 0.12 IU/mL — ABNORMAL LOW (ref 0.30–0.70)
Heparin Unfractionated: 0.94 IU/mL — ABNORMAL HIGH (ref 0.30–0.70)

## 2016-11-14 LAB — CBC
HEMATOCRIT: 46.3 % (ref 39.0–52.0)
HEMOGLOBIN: 16 g/dL (ref 13.0–17.0)
MCH: 30.9 pg (ref 26.0–34.0)
MCHC: 34.6 g/dL (ref 30.0–36.0)
MCV: 89.4 fL (ref 78.0–100.0)
Platelets: 119 10*3/uL — ABNORMAL LOW (ref 150–400)
RBC: 5.18 MIL/uL (ref 4.22–5.81)
RDW: 13.2 % (ref 11.5–15.5)
WBC: 6.2 10*3/uL (ref 4.0–10.5)

## 2016-11-14 LAB — PROTIME-INR
INR: 1.56
Prothrombin Time: 18.5 seconds — ABNORMAL HIGH (ref 11.4–15.2)

## 2016-11-14 MED ORDER — HEPARIN BOLUS VIA INFUSION
2000.0000 [IU] | Freq: Once | INTRAVENOUS | Status: AC
  Administered 2016-11-14: 2000 [IU] via INTRAVENOUS
  Filled 2016-11-14: qty 2000

## 2016-11-14 MED ORDER — HEPARIN (PORCINE) IN NACL 100-0.45 UNIT/ML-% IJ SOLN
1000.0000 [IU]/h | INTRAMUSCULAR | Status: DC
  Administered 2016-11-15 (×2): 1200 [IU]/h via INTRAVENOUS
  Filled 2016-11-14: qty 250

## 2016-11-14 MED ORDER — WARFARIN SODIUM 5 MG PO TABS
5.0000 mg | ORAL_TABLET | Freq: Once | ORAL | Status: AC
  Administered 2016-11-14: 5 mg via ORAL
  Filled 2016-11-14: qty 1

## 2016-11-14 NOTE — Progress Notes (Signed)
Patient has been referred to the following psychiatric inpatient treatment facilities: [facility], [place], [place], [place], [place], [place], and [place].  At capacity: [facility], [place], [place], [place], [facility]  CSW in disposition will continue to seek placement.  Verlon Setting, MSW, LCSWA Clinical social worker in [place], [place] Office

## 2016-11-14 NOTE — Progress Notes (Signed)
Arm wrist restraints taken off of patient at 12:10. Patient has been resting and calm without any incidents.

## 2016-11-14 NOTE — Progress Notes (Signed)
ANTICOAGULATION CONSULT NOTE - Follow Up Consult  Pharmacy Consult for Heparin and Coumadin Indication: atrial fibrillation and mechanical AVR   Patient Measurements: Height: 6\' 3"  (190.5 cm) Weight: 175 lb (79.4 kg) IBW/kg (Calculated) : 84.5 Heparin Dosing Weight: 79.4 kg  Vital Signs: BP: 126/94 (09/08 0558) Pulse Rate: 86 (09/08 0558)  Labs:  Recent Labs  11/11/16 1430  11/12/16 0343  11/13/16 0613 11/14/16 0455 11/14/16 1210  HGB 14.4  --  15.8  --  15.9 16.0  --   HCT 43.5  --  47.1  --  46.2 46.3  --   PLT 108*  --  113*  --  105* 119*  --   LABPROT  --   < > 15.4*  --  15.6* 18.5*  --   INR  --   < > 1.23  --  1.25 1.56  --   HEPARINUNFRC  --   --   --   < > 0.36 0.12* 0.94*  CREATININE 0.98  --   --   --   --   --   --   < > = values in this interval not displayed.  Estimated Creatinine Clearance: 70.9 mL/min (by C-G formula based on SCr of 0.98 mg/dL).   Assessment: 76 yo M presented with AMS, actively hallucinating, concern for Lewy Body dementia.  Noted to have subtherapeutic INR on admission, likely from noncompliance with medications with AMS.  Started on heparin infusion given mechanical AVR.  Bridging until INR therapeutic.  Heparin level is above goal this afternoon at 0.94, INR is below goal today. Spoke with the nurse and there is no concern with bleeding at this time. CBC is stable.   PTA dose warfarin: 1.5 mg TuThSat and 3mg  all other days (per [facility] visit on 8/7)  Goal of Therapy:  INR 2.5-3.5 for mechanical AVR + Afib Heparin level 0.3-0.7 units/ml Monitor platelets by anticoagulation protocol: Yes   Plan:  Decrease heparin to 1400 units/h  Re-check heparin level in 8 hours  Repeat Warfarin 5mg  PO x 1 tonight Daily INR/ heparin level Monitor for s/s bleeding   Jalene Mullet, Pharm.D. PGY1 Pharmacy Resident 11/14/2016 1:36 PM Main Pharmacy: [phone]

## 2016-11-14 NOTE — Progress Notes (Addendum)
Family Medicine Teaching Service Daily Progress Note Intern Pager: [phone]  Patient name: Juan Wilkerson Medical record number: [id number] Date of birth: Nov 03, 1940 Age: 76 y.o. Gender: male  Primary Care Provider: Lovenia Kim, MD Consultants: Psychiatry Code Status: FULL CODE  Pt Overview and Major Events to Date:  Juan Wilkerson is a 76 y.o. male presenting with altered mental status. PMH is significant for atrial flutter/fibrillation on warfarin, hypertension, hyperlipidemia, ?Marfan's syndrome, Aortic valve replacement, microscopic hematuria, left renal mass 9/6- Psychiatric assessment recommended inpatient placement ([facility])  Assessment and Plan:  Altered mental status: This seems most consistent with delirium with suspected underlying dementia. There are no focal changes or strength deficits. There is also no evidence of infection or toxic etiology. A/V hallucinations are concerning for lewy-body dementia, no MRI obtained yet for assessment. He is unable to remain in bed when unattended but poses a high fall risk and on therapeutic anticoagulation. He continues to feature sundowning with agitation at night and will probably require soft restraint again overnight with or without pharmacotherapy. We will see if he can safely tolerate reduced restraints and mobilization during the day. He will not be stable medically for a recommended inpatient psych admission until off IV heparin. -Continue daily neuro status assessment -Avoid sedating medications -Soft restraints to be renewed if sundowning tonight -Avoid haldol if possible(anti-dopaminergic in setting of suspected LBD) -If stabilized better, could pursue brain MRI under light sedation  Atrial flutter/fibrillation without RVR: Appears to be on metoprolol succinate 50 mg daily and warfarin 3 mg daily but PT/INR subtherapeutic. Chadvasc score 3 (hypertension and age) -Heparin per pharmacy -Metoprolol 25 mg BID with adequate rate  control -D/C telemetry monitoring for chronic Afib, rate controlled  Anticoagulation for Mechanical aortic valve: Supposedly on warfarin at home but INR subtherapeutic questioning dose or adherence. INR improving to 1.56 today but continues to need heparin gtt for subtherapeutic levels.  Hypertension: Normotensive on home regiment of amlodipine and metoprolol  Hyperlipidemia: On pravastatin at home. Total cholesterol is not significantly elevated. Also, primary prevention is likely less beneficial in this patient with progressive dementia symptoms  FEN/GI: Heart healthy diet VTE Prophylaxis: On Heparin gtt  Disposition: Recommending inpatient psychiatric admission  Subjective:  Juan Wilkerson is pleasant but disoriented this morning in no apparent distress. He is in soft restraints and mittens due to pulling at IV and leads. Overnight he became more agitated but has improved during the day. He is frequently requesting to walk to the bathroom to urinate despite a condom catheter in place.  Objective: Temp:  [97.7 F (36.5 C)] 97.7 F (36.5 C) (09/07 2231) Pulse Rate:  [86-98] 86 (09/08 0558) Resp:  [16] 16 (09/08 0558) BP: (126-140)/(78-94) 126/94 (09/08 0558) SpO2:  [96 %-97 %] 96 % (09/08 0558) Physical Exam: General: NAD, confused, in restraints and mittens Cardiovascular: Irregular rhythm, normal rate, loud S2, no murmur Respiratory: CTAB, normal WOB Abdomen: nontender, nondistended Extremities: 5/5 lower extremity strength bilaterally, no injuries Neuro: Disoriented, attends and following commands Psych: Tangential speech and scattered thought contents  Laboratory:  Recent Labs Lab 11/12/16 0343 11/13/16 0613 11/14/16 0455  WBC 7.8 7.7 6.2  HGB 15.8 15.9 16.0  HCT 47.1 46.2 46.3  PLT 113* 105* 119*    Recent Labs Lab 11/11/16 1430  NA 140  K 3.8  CL 106  CO2 23  BUN 20  CREATININE 0.98  CALCIUM 9.2  PROT 6.7  BILITOT 1.4*  ALKPHOS 83  ALT 13*  AST 21  GLUCOSE 107*   INR: 1.56  Collier Salina, MD 11/14/2016, 2:34 PM PGY-3, [place] [facility] [place] Intern pager: [phone], text pages welcome

## 2016-11-14 NOTE — Progress Notes (Signed)
ANTICOAGULATION CONSULT NOTE - Follow Up Consult  Pharmacy Consult for heparin Indication: Afib and AVR  Labs:  Recent Labs  11/11/16 1430 11/11/16 2012 11/12/16 0343  11/12/16 1916 11/13/16 0613 11/14/16 0455  HGB 14.4  --  15.8  --   --  15.9 16.0  HCT 43.5  --  47.1  --   --  46.2 46.3  PLT 108*  --  113*  --   --  105* 119*  LABPROT  --  14.9 15.4*  --   --  15.6*  --   INR  --  1.18 1.23  --   --  1.25  --   HEPARINUNFRC  --   --   --   < > 0.45 0.36 0.12*  CREATININE 0.98  --   --   --   --   --   --   < > = values in this interval not displayed.   Assessment: 76yo male now subtherapeutic on heparin despite rate increase yesterday for levels trending down.  Goal of Therapy:  Heparin level 0.3-0.7 units/ml   Plan:  Will give small bolus of heparin 2000 units and increase gtt by 3 units/kg/hr to 1600 units/hr and check level in 6hr.  Wynona Neat, PharmD, BCPS  11/14/2016,6:03 AM

## 2016-11-14 NOTE — Clinical Social Work Note (Addendum)
CSW notified pt has been medically cleared since 9/6 to go to a inpt gero psych facility. [facility] psych referrals faxed to;  [facility]            Fax# [place]    Fax# [place]                Fax# [place]  Fax# [place]         Fax# [phone]  Awaiting bed offers. CSW will continue to follow for DC planning.  Dang Mathison B. Joline Maxcy Clinical Social Work Dept Weekend Social Worker [phone] 12:02 PM

## 2016-11-14 NOTE — Progress Notes (Signed)
ANTICOAGULATION CONSULT NOTE - Follow Up Consult  Pharmacy Consult for Heparin Indication: atrial fibrillation and mechanical AVR   Patient Measurements: Height: 6\' 3"  (190.5 cm) Weight: 175 lb (79.4 kg) IBW/kg (Calculated) : 84.5 Heparin Dosing Weight: 79.4 kg  Vital Signs: Temp: 97.5 F (36.4 C) (09/08 2144) Temp Source: Oral (09/08 2144) BP: 103/75 (09/08 2144) Pulse Rate: 72 (09/08 2144)  Labs:  Recent Labs  11/12/16 0343  11/13/16 3428 11/14/16 0455 11/14/16 1210 11/14/16 2130  HGB 15.8  --  15.9 16.0  --   --   HCT 47.1  --  46.2 46.3  --   --   PLT 113*  --  105* 119*  --   --   LABPROT 15.4*  --  15.6* 18.5*  --   --   INR 1.23  --  1.25 1.56  --   --   HEPARINUNFRC  --   < > 0.36 0.12* 0.94* 1.07*  < > = values in this interval not displayed.  Estimated Creatinine Clearance: 70.9 mL/min (by C-G formula based on SCr of 0.98 mg/dL).   Assessment: 76 yo M presented with AMS, actively hallucinating, concern for Lewy Body dementia.  Noted to have subtherapeutic INR on admission, likely from noncompliance with medications with AMS.  Started on heparin infusion given mechanical AVR.  Bridging until INR therapeutic.  Heparin level remains above goal tonight at 1.07 after dose reduction earlier today. Per discussion with RN - heparin is running in R forearm, but she is unsure where level was drawn from and pt is confused. Will have to treat as a high level since unsure of lab accuracy. Communicated plan with RN. No bleed reported. CBC is stable.   Goal of Therapy:  INR 2.5-3.5 for mechanical AVR + Afib Heparin level 0.3-0.7 units/ml Monitor platelets by anticoagulation protocol: Yes   Plan:  Hold heparin x 1 hr Decrease heparin to 1200 units/h when resumed at 2330 Re-check heparin level in 8 hours  Daily INR/heparin level/CBC Monitor for s/s bleeding   Elicia Lamp, PharmD, BCPS Clinical Pharmacist 11/14/2016 10:18 PM

## 2016-11-15 ENCOUNTER — Inpatient Hospital Stay (HOSPITAL_COMMUNITY): Payer: Medicare Other

## 2016-11-15 DIAGNOSIS — I10 Essential (primary) hypertension: Secondary | ICD-10-CM

## 2016-11-15 DIAGNOSIS — E785 Hyperlipidemia, unspecified: Secondary | ICD-10-CM

## 2016-11-15 DIAGNOSIS — F0280 Dementia in other diseases classified elsewhere without behavioral disturbance: Secondary | ICD-10-CM

## 2016-11-15 LAB — PROTIME-INR
INR: 2.02
PROTHROMBIN TIME: 22.7 s — AB (ref 11.4–15.2)

## 2016-11-15 LAB — CBC
HCT: 47.2 % (ref 39.0–52.0)
HEMOGLOBIN: 16.6 g/dL (ref 13.0–17.0)
MCH: 31.7 pg (ref 26.0–34.0)
MCHC: 35.2 g/dL (ref 30.0–36.0)
MCV: 90.1 fL (ref 78.0–100.0)
PLATELETS: 109 10*3/uL — AB (ref 150–400)
RBC: 5.24 MIL/uL (ref 4.22–5.81)
RDW: 13.8 % (ref 11.5–15.5)
WBC: 7.5 10*3/uL (ref 4.0–10.5)

## 2016-11-15 LAB — HEPARIN LEVEL (UNFRACTIONATED)
Heparin Unfractionated: 0.68 IU/mL (ref 0.30–0.70)
Heparin Unfractionated: 0.88 IU/mL — ABNORMAL HIGH (ref 0.30–0.70)

## 2016-11-15 MED ORDER — WARFARIN SODIUM 5 MG PO TABS
5.0000 mg | ORAL_TABLET | Freq: Once | ORAL | Status: AC
  Administered 2016-11-15: 5 mg via ORAL
  Filled 2016-11-15: qty 1

## 2016-11-15 NOTE — Progress Notes (Signed)
ANTICOAGULATION CONSULT NOTE - Follow Up Consult  Pharmacy Consult for Heparin Indication: atrial fibrillation and mechanical AVR   Patient Measurements: Height: 6\' 3"  (190.5 cm) Weight: 175 lb (79.4 kg) IBW/kg (Calculated) : 84.5 Heparin Dosing Weight: 79.4 kg  Vital Signs: Temp: 98.1 F (36.7 C) (09/09 0533) Temp Source: Axillary (09/09 0533) BP: 122/74 (09/09 0533) Pulse Rate: 58 (09/09 0533)  Labs:  Recent Labs  11/13/16 3202 11/14/16 0455 11/14/16 1210 11/14/16 2130 11/15/16 0613  HGB 15.9 16.0  --   --  16.6  HCT 46.2 46.3  --   --  47.2  PLT 105* 119*  --   --  109*  LABPROT 15.6* 18.5*  --   --  22.7*  INR 1.25 1.56  --   --  2.02  HEPARINUNFRC 0.36 0.12* 0.94* 1.07* 0.68    Estimated Creatinine Clearance: 70.9 mL/min (by C-G formula based on SCr of 0.98 mg/dL).   Assessment: 76 yo M presented with AMS, actively hallucinating, concern for Lewy Body dementia.  Noted to have subtherapeutic INR on admission, likely from noncompliance with medications with AMS.  Started on heparin infusion given mechanical AVR.  Bridging until INR therapeutic.  Heparin level is at goal this morning at 0.68. INR today is below goal at 2.02.  Spoke with nurse and no concern with bleeding or issues with heparin infusion overnight. CBC is stable.   Goal of Therapy:  INR 2.5-3.5 for mechanical AVR + Afib Heparin level 0.3-0.7 units/ml Monitor platelets by anticoagulation protocol: Yes   Plan:   Continue heparin at 1200 units/h  Repeat warfarin 5mg  PO x 1 tonight Re-check heparin level in 8 hours  Daily INR/heparin level/CBC Monitor for s/s bleeding   Jalene Mullet, Pharm.D. PGY1 Pharmacy Resident 11/15/2016 8:03 AM Main Pharmacy: [phone]

## 2016-11-15 NOTE — Progress Notes (Signed)
ANTICOAGULATION CONSULT NOTE - Follow Up Consult  Pharmacy Consult for Heparin Indication: atrial fibrillation and mechanical AVR   Patient Measurements: Height: 6\' 3"  (190.5 cm) Weight: 175 lb (79.4 kg) IBW/kg (Calculated) : 84.5 Heparin Dosing Weight: 79.4 kg  Vital Signs: Temp: 97.8 F (36.6 C) (09/09 1258) Temp Source: Oral (09/09 1258) BP: 133/77 (09/09 1258) Pulse Rate: 57 (09/09 1258)  Labs:  Recent Labs  11/13/16 6720 11/14/16 0455  11/14/16 2130 11/15/16 0613 11/15/16 1553  HGB 15.9 16.0  --   --  16.6  --   HCT 46.2 46.3  --   --  47.2  --   PLT 105* 119*  --   --  109*  --   LABPROT 15.6* 18.5*  --   --  22.7*  --   INR 1.25 1.56  --   --  2.02  --   HEPARINUNFRC 0.36 0.12*  < > 1.07* 0.68 0.88*  < > = values in this interval not displayed.  Estimated Creatinine Clearance: 70.9 mL/min (by C-G formula based on SCr of 0.98 mg/dL).   Assessment: 76 yo M presented with AMS, actively hallucinating, concern for Lewy Body dementia.  He is on chronic coumadin but was subtherapeutic upon admission and now continues on IV heparin. Heparin level is now supratherapeutic again. No bleeding noted.   Goal of Therapy:  Heparin level 0.3-0.7 units/ml Monitor platelets by anticoagulation protocol: Yes   Plan:   Reduce heparin gtt to 1000 units/hr Check an 8 hr heparin level Daily heparin level and CBC  Salome Arnt, PharmD, BCPS 11/15/2016 4:38 PM

## 2016-11-15 NOTE — Progress Notes (Signed)
Family Medicine Teaching Service Daily Progress Note Intern Pager: [phone]  Patient name: Juan Wilkerson Medical record number: [id number] Date of birth: 1941-02-11 Age: 76 y.o. Gender: male  Primary Care Provider: Lovenia Kim, MD Consultants: Psychiatry Code Status: FULL CODE  Pt Overview and Major Events to Date:  Juan Wilkerson is a 76 y.o. male presenting with altered mental status. PMH is significant for atrial flutter/fibrillation on warfarin, hypertension, hyperlipidemia, ?Marfan's syndrome, Aortic valve replacement, microscopic hematuria, left renal mass 9/6- Psychiatric assessment recommended inpatient placement ([facility])  Assessment and Plan:  Altered mental status: He has tolerated partial restraint removal (in mittens) without significant behavioral problems since Friday night. I have not observed any evidence of ongoing audiovisual hallucinations although he is consistently disoriented with lots of tangential thought content during conversation. 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 After he is less tethered and remains calm this would be easier. -Soft restraints to be renewed if agitated -Avoid haldol if possible(anti-dopaminergic in setting of suspected LBD) -If stabilized better, could pursue brain MRI under light sedation -Inpatient psychiatric placement process started  Anticoagulation for Mechanical aortic valve: INR 2.02 today, which is actually within therapeutic range for a mechanical valve in the aortic position. He needs to be at goal for at least 2 days before we can continued on warfarin alone. It is unclear if he was missing his medicine or just underdosed, but I suspect some nonadherence due to his mental status. -Coumadin 5mg  PM -Repeat INR in AM -Heparin gtt, pharmacy assistance for monitoring and titration of anticoagulation meds is appreciated  Atrial flutter/fibrillation  without RVR: He is very well rate controlled on metoprolol. Anticoagulation as above.  FEN/GI: Heart healthy diet VTE Prophylaxis: On Heparin gtt  Disposition: Recommending inpatient psychiatric admission  Subjective:  Juan Wilkerson was sleeping comfortably this morning and denies any new complaints. He did not have any significant problems with agitation or insomnia reported overnight. He remains on a heparin drip with mittens in place.  Objective: Temp:  [97.5 F (36.4 C)-98.8 F (37.1 C)] 98.1 F (36.7 C) (09/09 0533) Pulse Rate:  [58-88] 60 (09/09 0849) Resp:  [16-18] 16 (09/09 0533) BP: (103-123)/(74-90) 122/74 (09/09 0533) SpO2:  [96 %-98 %] 98 % (09/09 0533) Physical Exam: General: NAD, sleeping comfortably, confused, in mittens Cardiovascular: Irregular rhythm, normal rate, loud S2 Respiratory: CTAB, normal WOB Extremities: 5/5 lower extremity strength bilaterally, no injuries Neuro: Disoriented but follows commands Psych: Answers are partially appropriate but demonstrate tangential thought contents or word searching  Laboratory:  Recent Labs Lab 11/13/16 0613 11/14/16 0455 11/15/16 0613  WBC 7.7 6.2 7.5  HGB 15.9 16.0 16.6  HCT 46.2 46.3 47.2  PLT 105* 119* 109*    Recent Labs Lab 11/11/16 1430  NA 140  K 3.8  CL 106  CO2 23  BUN 20  CREATININE 0.98  CALCIUM 9.2  PROT 6.7  BILITOT 1.4*  ALKPHOS 83  ALT 13*  AST 21  GLUCOSE 107*   INR: 2.02  Juan Salina, MD 11/15/2016, 10:23 AM PGY-3, [facility] [place] Intern pager: [phone], text pages welcome

## 2016-11-16 ENCOUNTER — Inpatient Hospital Stay (HOSPITAL_COMMUNITY): Payer: Medicare Other

## 2016-11-16 LAB — CBC
HEMATOCRIT: 47.3 % (ref 39.0–52.0)
Hemoglobin: 16.1 g/dL (ref 13.0–17.0)
MCH: 31 pg (ref 26.0–34.0)
MCHC: 34 g/dL (ref 30.0–36.0)
MCV: 91 fL (ref 78.0–100.0)
PLATELETS: 121 10*3/uL — AB (ref 150–400)
RBC: 5.2 MIL/uL (ref 4.22–5.81)
RDW: 13.5 % (ref 11.5–15.5)
WBC: 8.3 10*3/uL (ref 4.0–10.5)

## 2016-11-16 LAB — HEPARIN LEVEL (UNFRACTIONATED)
Heparin Unfractionated: 0.56 IU/mL (ref 0.30–0.70)
Heparin Unfractionated: 0.51 IU/mL (ref 0.30–0.70)

## 2016-11-16 LAB — PROTIME-INR
INR: 2.24
Prothrombin Time: 24.6 seconds — ABNORMAL HIGH (ref 11.4–15.2)

## 2016-11-16 LAB — VITAMIN B12: VITAMIN B 12: 1356 pg/mL — AB (ref 180–914)

## 2016-11-16 MED ORDER — HEPARIN (PORCINE) IN NACL 100-0.45 UNIT/ML-% IJ SOLN
1000.0000 [IU]/h | INTRAMUSCULAR | Status: DC
  Administered 2016-11-16 (×2): 1000 [IU]/h via INTRAVENOUS
  Filled 2016-11-16: qty 250

## 2016-11-16 MED ORDER — WARFARIN SODIUM 5 MG PO TABS
5.0000 mg | ORAL_TABLET | Freq: Once | ORAL | Status: AC
  Administered 2016-11-16: 5 mg via ORAL
  Filled 2016-11-16: qty 1

## 2016-11-16 NOTE — Progress Notes (Addendum)
ANTICOAGULATION CONSULT NOTE - Follow Up Consult  Pharmacy Consult for Heparin and warfarin Indication: atrial fibrillation and mechanical AVR   Patient Measurements: Height: 6\' 3"  (190.5 cm) Weight: 175 lb (79.4 kg) IBW/kg (Calculated) : 84.5 Heparin Dosing Weight: 79.4 kg  Vital Signs: Temp: 98.3 F (36.8 C) (09/10 0546) Temp Source: Oral (09/10 0546) BP: 118/88 (09/10 0546) Pulse Rate: 110 (09/10 0546)  Labs:  Recent Labs  11/14/16 0455  11/15/16 0613 11/15/16 1553 11/16/16 0030 11/16/16 0304  HGB 16.0  --  16.6  --  16.1  --   HCT 46.3  --  47.2  --  47.3  --   PLT 119*  --  109*  --  121*  --   LABPROT 18.5*  --  22.7*  --  24.6*  --   INR 1.56  --  2.02  --  2.24  --   HEPARINUNFRC 0.12*  < > 0.68 0.88* 0.56 0.51  < > = values in this interval not displayed.  Estimated Creatinine Clearance: 70.9 mL/min (by C-G formula based on SCr of 0.98 mg/dL).   Assessment: 76 yo M presented with AMS, actively hallucinating, concern for Lewy Body dementia.  Noted to have subtherapeutic INR on admission, likely from noncompliance with medications with AMS.  Started on heparin infusion given mechanical AVR.  Bridging until INR therapeutic.  Heparin level was therapeutic this morning at 0.51. INR today remains subtherapeutic at 2.24.  No bleeding noted, CBC is stable.   Goal of Therapy:  INR 2.5-3.5 for mechanical AVR + Afib Heparin level 0.3-0.7 units/ml Monitor platelets by anticoagulation protocol: Yes   Plan:   Continue heparin at 1000 units/h  Give warfarin 5mg  PO x 1 tonight Monitor daily anti-Xa level,  INR, CBC, clinical course, s/sx of bleed, PO intake, DDI   Thank you for allowing [place] to participate in this patients care.  Jens Som, PharmD Clinical phone for 11/16/2016 from 7a-3:30p: [phone] If after 3:30p, please call main pharmacy at: [phone] 11/16/2016 8:44 AM

## 2016-11-16 NOTE — Progress Notes (Addendum)
ANTICOAGULATION CONSULT NOTE - Follow Up Consult  Pharmacy Consult for Heparin + warfarin  Indication: atrial fibrillation and mechanical AVR  Patient Measurements: Height: 6\' 3"  (190.5 cm) Weight: 175 lb (79.4 kg) IBW/kg (Calculated) : 84.5 Heparin Dosing Weight: 79.4 kg  Vital Signs: Temp: 98 F (36.7 C) (09/09 2047) Temp Source: Oral (09/09 2047) BP: 114/84 (09/09 2047) Pulse Rate: 79 (09/09 2047)  Labs:  Recent Labs  11/14/16 0455  11/15/16 0613 11/15/16 1553 11/16/16 0030  HGB 16.0  --  16.6  --  16.1  HCT 46.3  --  47.2  --  47.3  PLT 119*  --  109*  --  121*  LABPROT 18.5*  --  22.7*  --  24.6*  INR 1.56  --  2.02  --  2.24  HEPARINUNFRC 0.12*  < > 0.68 0.88* 0.56  < > = values in this interval not displayed.  Estimated Creatinine Clearance: 70.9 mL/min (by C-G formula based on SCr of 0.98 mg/dL).  Assessment: 76 yo M presented with AMS, actively hallucinating, concern for Lewy Body dementia.  He is on chronic coumadin but was subtherapeutic upon admission and now continues on IV heparin bridge per pharmacy.   Heparin level therapeutic at 0.56. Noted INR from midnight is also therapeutic at 2.24 (for INR 2-3 goal). Today (9/10) will be day 5 of overlapping therapy with heparin + warfarin. May consider d/c heparin soon pending INR trend/discussion with MD. CBC stable and no s/s bleeding noted.   Goal of Therapy:  Heparin level 0.3-0.7 units/ml INR 2-3 per MD notes  Monitor platelets by anticoagulation protocol: Yes   Plan:   Continue heparin gtt at 1000 units/hr Daily heparin level, INR, and CBC Monitor for s/s bleeding F/u heparin gtt length of therapy with MD Clarify INR goal with MD   Argie Ramming, PharmD Clinical Pharmacist 11/16/16 2:20 AM

## 2016-11-16 NOTE — Progress Notes (Signed)
Physical Therapy Treatment Patient Details Name: Juan Wilkerson MRN: [id number] DOB: [birth year] Today's Date: 11/16/2016    History of Present Illness Juan Wilkerson is a 76 y.o. male presenting with altered mental status. PMH is significant for atrial flutter/fibrillation on warfarin, hypertension, hyperlipidemia, ?Marfan's syndrome, Aortic valve replacement, microscopic hematuria, left renal mass    PT Comments    Pt with noted cognitive impairment and confusion. Pt unable to recall after being re-oriented. Pt unable to recall therapist or tech's name. Pt with tangential speech. Pt with improved ambulation tolerance however con't to require 2 people for safe mobility.   Follow Up Recommendations  SNF     Equipment Recommendations       Recommendations for Other Services       Precautions / Restrictions Precautions Precautions: Fall;Other (comment) Restrictions Weight Bearing Restrictions: No    Mobility  Bed Mobility Overal bed mobility: Needs Assistance Bed Mobility: Supine to Sit     Supine to sit: Min assist;HOB elevated     General bed mobility comments: tactile cues to complete task, minA for trunk elevation  Transfers Overall transfer level: Needs assistance Equipment used: 2 person hand held assist;1 person hand held assist Transfers: Sit to/from Stand Sit to Stand: Mod assist         General transfer comment: max directional and tactile cues to complete task. modA for anterior transition, pt guarded and unsteady  Ambulation/Gait Ambulation/Gait assistance: Mod assist;+2 physical assistance Ambulation Distance (Feet): 120 Feet Assistive device: 1 person hand held assist (pushed IV pole) Gait Pattern/deviations: Step-through pattern;Decreased stride length;Shuffle Gait velocity: slow Gait velocity interpretation: Below normal speed for age/gender General Gait Details: pt with increased trunk flexion, pt with short shuffled steps, pt required assist to keep  momentum of pushing IV pole forward.    Stairs            Wheelchair Mobility    Modified Rankin (Stroke Patients Only)       Balance Overall balance assessment: Needs assistance Sitting-balance support: No upper extremity supported;Feet supported Sitting balance-Leahy Scale: Fair     Standing balance support: Bilateral upper extremity supported Standing balance-Leahy Scale: Poor Standing balance comment: reliant on external support                            Cognition Arousal/Alertness: Lethargic Behavior During Therapy: Flat affect Overall Cognitive Status: No family/caregiver present to determine baseline cognitive functioning                                 General Comments: pt confused, poor recall when re-oriented, tangential speech      Exercises      General Comments        Pertinent Vitals/Pain Pain Assessment: No/denies pain    Home Living                      Prior Function            PT Goals (current goals can now be found in the care plan section) Progress towards PT goals: Progressing toward goals    Frequency    Min 3X/week      PT Plan Current plan remains appropriate    Co-evaluation              AM-PAC PT "6 Clicks" Daily Activity  Outcome Measure  Difficulty turning  over in bed (including adjusting bedclothes, sheets and blankets)?: Unable Difficulty moving from lying on back to sitting on the side of the bed? : Unable Difficulty sitting down on and standing up from a chair with arms (e.g., wheelchair, bedside commode, etc,.)?: Unable Help needed moving to and from a bed to chair (including a wheelchair)?: A Lot Help needed walking in hospital room?: A Lot Help needed climbing 3-5 steps with a railing? : A Lot 6 Click Score: 9    End of Session Equipment Utilized During Treatment: Gait belt Activity Tolerance: Patient tolerated treatment well Patient left: in chair;with call  bell/phone within reach;with chair alarm set Nurse Communication: Mobility status PT Visit Diagnosis: Unsteadiness on feet (R26.81);Other abnormalities of gait and mobility (R26.89);Other (comment)     Time: 1130-1150 PT Time Calculation (min) (ACUTE ONLY): 20 min  Charges:  $Gait Training: 8-22 mins                    G Codes:       Kittie Plater, PT, DPT Pager #: [phone] Office #: [phone]    [place] 11/16/2016, 1:03 PM

## 2016-11-16 NOTE — Progress Notes (Addendum)
Juan Wilkerson has denied patient due to medical issues. Patient's bloodwork is not stable.   9/11: [facility] does not have any Geri beds at this time. [facility] requested updated clinicals.   9/10:CSW called Darlene at [facility]. She is going to present patient to medical team.  Neysa Hotter [phone]

## 2016-11-16 NOTE — Progress Notes (Signed)
Pt spit out half of a white pill from his mouth, unsure of which medication this is.

## 2016-11-16 NOTE — Progress Notes (Signed)
Family Medicine Teaching Service Daily Progress Note Intern Pager: [phone]  Patient name: Juan Wilkerson Medical record number: [id number] Date of birth: 15-Sep-1940 Age: 76 y.o. Gender: male  Primary Care Provider: Lovenia Kim, MD Consultants: Psychiatry Code Status: FULL CODE  Pt Overview and Major Events to Date:  Juan Wilkerson is a 76 y.o. male presenting with altered mental status. PMH is significant for atrial flutter/fibrillation on warfarin, hypertension, hyperlipidemia, Marfan's syndrome, Aortic valve replacement, microscopic hematuria, left renal mass 9/6- Psychiatric assessment recommended inpatient placement ([facility])  Assessment and Plan:  Altered mental status: He has tolerated partial restraint removal (in mittens) without significant behavioral problems. Tangential thought content during conversation. 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 -Soft restraints to be renewed if agitated -Avoid haldol (anti-dopaminergic in setting of suspected LBD) -Not tolerating MRI even with Ativan currently -Inpatient psychiatric placement process started  Anticoagulation for Mechanical aortic valve: INR 2.02> 2.24 on 09/10, therapeutic range for a aortic mechanical valve with A-fib is 2.5-3.5. He needs to be at goal for at least 2 days before we can continued on warfarin alone. Likely due to nonadherence from mental status. -Coumadin 5mg  PM -Repeat INR in AM -Heparin gtt, pharmacy assistance for monitoring and titration of anticoagulation meds is appreciated  Atrial flutter/fibrillation without RVR: Rate controlled on metoprolol.  - Anticoagulation as above. - Metoprolol 25 mg BID  FEN/GI: Heart healthy diet VTE Prophylaxis: Coumadin  Disposition: Inpatient psychiatric admission  Subjective:  Juan Wilkerson is lying in bed and thoughts are tangential. Has trouble finding words. Difficult to get  anything out of him, but he does ask for some water and has no issue with drinking through a straw.   Objective: Temp:  [97.8 F (36.6 C)-98.3 F (36.8 C)] 98.3 F (36.8 C) (09/10 0546) Pulse Rate:  [57-110] 110 (09/10 0546) Resp:  [16-20] 18 (09/10 0546) BP: (114-138)/(77-88) 118/88 (09/10 0546) SpO2:  [96 %-100 %] 97 % (09/10 0546) Physical Exam: General: NAD, laying comfortably, confused, in mittens Cardiovascular: Irregular rhythm, normal rate, loud S2 Respiratory: CTAB, normal WOB Extremities: 5/5 lower extremity strength bilaterally, no injuries Neuro: Disoriented but follows commands Psych: Answers are partially appropriate but demonstrate tangential thought contents or word searching  Laboratory:  Recent Labs Lab 11/14/16 0455 11/15/16 0613 11/16/16 0030  WBC 6.2 7.5 8.3  HGB 16.0 16.6 16.1  HCT 46.3 47.2 47.3  PLT 119* 109* 121*    Recent Labs Lab 11/11/16 1430  NA 140  K 3.8  CL 106  CO2 23  BUN 20  CREATININE 0.98  CALCIUM 9.2  PROT 6.7  BILITOT 1.4*  ALKPHOS 83  ALT 13*  AST 21  GLUCOSE 107*   INR: 2.24  Juan Wilkerson, [place], DO 11/16/2016, 9:04 AM PGY-1, [place] Intern pager: [phone], text pages welcome

## 2016-11-17 ENCOUNTER — Inpatient Hospital Stay (HOSPITAL_COMMUNITY): Payer: Medicare Other

## 2016-11-17 LAB — URINALYSIS, ROUTINE W REFLEX MICROSCOPIC
Bilirubin Urine: NEGATIVE
GLUCOSE, UA: NEGATIVE mg/dL
KETONES UR: 15 mg/dL — AB
Nitrite: NEGATIVE
PH: 6.5 (ref 5.0–8.0)
Protein, ur: 30 mg/dL — AB
Specific Gravity, Urine: 1.02 (ref 1.005–1.030)

## 2016-11-17 LAB — BLOOD GAS, ARTERIAL
ACID-BASE DEFICIT: 1.4 mmol/L (ref 0.0–2.0)
BICARBONATE: 21.9 mmol/L (ref 20.0–28.0)
Drawn by: 331761
FIO2: 0.21
O2 Saturation: 95.4 %
PATIENT TEMPERATURE: 99.8
PO2 ART: 77.9 mmHg — AB (ref 83.0–108.0)
pCO2 arterial: 32.2 mmHg (ref 32.0–48.0)
pH, Arterial: 7.451 — ABNORMAL HIGH (ref 7.350–7.450)

## 2016-11-17 LAB — COMPREHENSIVE METABOLIC PANEL
ALK PHOS: 91 U/L (ref 38–126)
ALT: 19 U/L (ref 17–63)
ANION GAP: 10 (ref 5–15)
AST: 23 U/L (ref 15–41)
Albumin: 3.1 g/dL — ABNORMAL LOW (ref 3.5–5.0)
BUN: 40 mg/dL — ABNORMAL HIGH (ref 6–20)
CALCIUM: 8.8 mg/dL — AB (ref 8.9–10.3)
CO2: 21 mmol/L — AB (ref 22–32)
Chloride: 107 mmol/L (ref 101–111)
Creatinine, Ser: 1.51 mg/dL — ABNORMAL HIGH (ref 0.61–1.24)
GFR calc non Af Amer: 43 mL/min — ABNORMAL LOW (ref >60)
GFR, EST AFRICAN AMERICAN: 50 mL/min — AB (ref >60)
Glucose, Bld: 132 mg/dL — ABNORMAL HIGH (ref 65–99)
POTASSIUM: 3.7 mmol/L (ref 3.5–5.1)
SODIUM: 138 mmol/L (ref 135–145)
TOTAL PROTEIN: 6.1 g/dL — AB (ref 6.5–8.1)
Total Bilirubin: 1.3 mg/dL — ABNORMAL HIGH (ref 0.3–1.2)

## 2016-11-17 LAB — BASIC METABOLIC PANEL
Anion gap: 9 (ref 5–15)
BUN: 36 mg/dL — AB (ref 6–20)
CO2: 22 mmol/L (ref 22–32)
CREATININE: 1.36 mg/dL — AB (ref 0.61–1.24)
Calcium: 9.2 mg/dL (ref 8.9–10.3)
Chloride: 107 mmol/L (ref 101–111)
GFR calc Af Amer: 56 mL/min — ABNORMAL LOW (ref >60)
GFR, EST NON AFRICAN AMERICAN: 49 mL/min — AB (ref >60)
GLUCOSE: 147 mg/dL — AB (ref 65–99)
POTASSIUM: 3.7 mmol/L (ref 3.5–5.1)
SODIUM: 138 mmol/L (ref 135–145)

## 2016-11-17 LAB — URINALYSIS, MICROSCOPIC (REFLEX)

## 2016-11-17 LAB — CBC
HCT: 47.1 % (ref 39.0–52.0)
HEMATOCRIT: 45.7 % (ref 39.0–52.0)
HEMOGLOBIN: 15.3 g/dL (ref 13.0–17.0)
HEMOGLOBIN: 15.7 g/dL (ref 13.0–17.0)
MCH: 30.5 pg (ref 26.0–34.0)
MCH: 30.4 pg (ref 26.0–34.0)
MCHC: 33.5 g/dL (ref 30.0–36.0)
MCHC: 33.3 g/dL (ref 30.0–36.0)
MCV: 91 fL (ref 78.0–100.0)
MCV: 91.3 fL (ref 78.0–100.0)
PLATELETS: 113 10*3/uL — AB (ref 150–400)
Platelets: 89 10*3/uL — ABNORMAL LOW (ref 150–400)
RBC: 5.02 MIL/uL (ref 4.22–5.81)
RBC: 5.16 MIL/uL (ref 4.22–5.81)
RDW: 13.9 % (ref 11.5–15.5)
RDW: 14 % (ref 11.5–15.5)
WBC: 14.8 10*3/uL — ABNORMAL HIGH (ref 4.0–10.5)
WBC: 13.7 10*3/uL — ABNORMAL HIGH (ref 4.0–10.5)

## 2016-11-17 LAB — PROTIME-INR
INR: 4.49 — AB
PROTHROMBIN TIME: 42.2 s — AB (ref 11.4–15.2)

## 2016-11-17 LAB — TROPONIN I
TROPONIN I: 0.07 ng/mL — AB (ref <0.03)
TROPONIN I: 0.08 ng/mL — AB (ref <0.03)

## 2016-11-17 LAB — HEPARIN LEVEL (UNFRACTIONATED): HEPARIN UNFRACTIONATED: 0.32 [IU]/mL (ref 0.30–0.70)

## 2016-11-17 LAB — AMMONIA: Ammonia: 30 umol/L (ref 9–35)

## 2016-11-17 LAB — MRSA PCR SCREENING: MRSA by PCR: NEGATIVE

## 2016-11-17 MED ORDER — SODIUM CHLORIDE 0.9 % IV BOLUS (SEPSIS)
1000.0000 mL | Freq: Once | INTRAVENOUS | Status: AC
  Administered 2016-11-17: 1000 mL via INTRAVENOUS

## 2016-11-17 MED ORDER — SODIUM CHLORIDE 0.9 % IV BOLUS (SEPSIS)
500.0000 mL | Freq: Once | INTRAVENOUS | Status: AC
  Administered 2016-11-17: 500 mL via INTRAVENOUS

## 2016-11-17 MED ORDER — VANCOMYCIN HCL IN DEXTROSE 750-5 MG/150ML-% IV SOLN
750.0000 mg | Freq: Two times a day (BID) | INTRAVENOUS | Status: DC
Start: 2016-11-18 — End: 2016-11-18
  Administered 2016-11-18: 750 mg via INTRAVENOUS
  Filled 2016-11-17: qty 150

## 2016-11-17 MED ORDER — CEFEPIME HCL 2 G IJ SOLR
2.0000 g | INTRAMUSCULAR | Status: DC
  Administered 2016-11-17 – 2016-11-19 (×3): 2 g via INTRAVENOUS
  Filled 2016-11-17 (×4): qty 2

## 2016-11-17 MED ORDER — VANCOMYCIN HCL IN DEXTROSE 1-5 GM/200ML-% IV SOLN
1000.0000 mg | INTRAVENOUS | Status: AC
  Administered 2016-11-17: 1000 mg via INTRAVENOUS
  Filled 2016-11-17: qty 200

## 2016-11-17 NOTE — Progress Notes (Signed)
Physical Therapy Treatment Patient Details Name: Juan Wilkerson MRN: [id number] DOB: December 26, 1940 Today's Date: 11/17/2016    History of Present Illness Juan Wilkerson is a 76 y.o. male presenting with altered mental status. PMH is significant for atrial flutter/fibrillation on warfarin, hypertension, hyperlipidemia, ?Marfan's syndrome, Aortic valve replacement, microscopic hematuria, left renal mass    PT Comments    Pt very lethargic today, opened eyes in sitting and attempted some verbalization, participated in drinking some orange juice. But otherwise, tot A for bed mobility and attempting to stand. Unable to achieve upright standing or get to chair today. PT will continue to follow.    Follow Up Recommendations  SNF     Equipment Recommendations  None recommended by PT    Recommendations for Other Services       Precautions / Restrictions Precautions Precautions: Fall Restrictions Weight Bearing Restrictions: No    Mobility  Bed Mobility Overal bed mobility: Needs Assistance Bed Mobility: Supine to Sit;Sit to Supine     Supine to sit: +2 for physical assistance;Total assist Sit to supine: +2 for physical assistance;Total assist   General bed mobility comments: assist for all aspects, no attempt to initiate or ability to fall commands  Transfers Overall transfer level: Needs assistance Equipment used: 2 person hand held assist Transfers: Sit to/from Stand Sit to Stand: Max assist;+2 physical assistance         General transfer comment: attempted to stand from EOB x 1, pt without initiation   Ambulation/Gait             General Gait Details: pt unable today   Stairs            Wheelchair Mobility    Modified Rankin (Stroke Patients Only)       Balance Overall balance assessment: Needs assistance Sitting-balance support: Feet supported;Single extremity supported Sitting balance-Leahy Scale: Poor Sitting balance - Comments: heavy mod assist  with posterior and L side bias Postural control: Posterior lean;Left lateral lean Standing balance support: Bilateral upper extremity supported Standing balance-Leahy Scale: Zero Standing balance comment: unable to stand this visit                            Cognition Arousal/Alertness: Lethargic Behavior During Therapy: Flat affect Overall Cognitive Status: No family/caregiver present to determine baseline cognitive functioning                                 General Comments: pt with minimal verbalization, keeping eyes closed most of session      Exercises      General Comments General comments (skin integrity, edema, etc.): pt unfocused today as well as lethargic      Pertinent Vitals/Pain Pain Assessment: Faces Faces Pain Scale: Hurts little more Pain Location: groaned with movement and said "yes" when asked if he hurt but cuold not state where or how much Pain Descriptors / Indicators: Grimacing;Moaning Pain Intervention(s): Limited activity within patient's tolerance;Monitored during session    Home Living                      Prior Function            PT Goals (current goals can now be found in the care plan section) Acute Rehab PT Goals Patient Stated Goal: none stated PT Goal Formulation: Patient unable to participate in goal setting Time  For Goal Achievement: 11/27/16 Potential to Achieve Goals: Fair Progress towards PT goals: Not progressing toward goals - comment (lethargy)    Frequency    Min 2X/week      PT Plan Frequency needs to be updated    Co-evaluation PT/OT/SLP Co-Evaluation/Treatment: Yes Reason for Co-Treatment: Necessary to address cognition/behavior during functional activity;For patient/therapist safety PT goals addressed during session: Mobility/safety with mobility;Balance OT goals addressed during session: ADL's and self-care;Strengthening/ROM      AM-PAC PT "6 Clicks" Daily Activity   Outcome Measure  Difficulty turning over in bed (including adjusting bedclothes, sheets and blankets)?: Unable Difficulty moving from lying on back to sitting on the side of the bed? : Unable Difficulty sitting down on and standing up from a chair with arms (e.g., wheelchair, bedside commode, etc,.)?: Unable Help needed moving to and from a bed to chair (including a wheelchair)?: Total Help needed walking in hospital room?: Total Help needed climbing 3-5 steps with a railing? : Total 6 Click Score: 6    End of Session Equipment Utilized During Treatment: Gait belt Activity Tolerance: Patient limited by lethargy Patient left: in bed;with bed alarm set;with call bell/phone within reach Nurse Communication: Mobility status PT Visit Diagnosis: Unsteadiness on feet (R26.81);Other abnormalities of gait and mobility (R26.89);Other (comment)     Time: 0370-4888 PT Time Calculation (min) (ACUTE ONLY): 24 min  Charges:  $Therapeutic Activity: 8-22 mins                    G Codes:       Leighton Roach, PT  Acute Rehab Services  [place] 11/17/2016, 1:55 PM

## 2016-11-17 NOTE — Progress Notes (Signed)
FPTS Interim Progress Note Called and talked to patient's brother, Mr. Dujuan Stankowski for updates. Mr. Loni Abdon lives in [place]. He says he last saw the patient about 4 years ago. And he talked to him over the phone about a year ago as well. At that time, he noticed some confusion when they were talking over the phone.   Mr. Brittney Caraway was able to tell me about his brothers condition including the reason for admission and hospital course based on what he heard from Dr. Lindell Noe earlier today. I also informed him about possible infection given his fever, leukocytosis and urinalysis concerning for UTI. I told him that we are starting him on antibiotic. He voiced understanding. He has no further questions. He is appreciative about the call.  Off note, Mr. Veronica Guerrant states that his mother has Alzheimer's dementia, that he wasn't aware of until she passed away at 77 years of age.   Mercy Riding, MD 11/17/2016, 6:00 PM PGY-3, [facility] Service pager [phone]

## 2016-11-17 NOTE — Progress Notes (Signed)
ANTICOAGULATION CONSULT NOTE - Follow Up Consult  Pharmacy Consult for warfarin Indication: atrial fibrillation and mechanical AVR   Patient Measurements: Height: 6\' 3"  (190.5 cm) Weight: 175 lb (79.4 kg) IBW/kg (Calculated) : 84.5 Heparin Dosing Weight: 79.4 kg  Vital Signs: Temp: 97.9 F (36.6 C) (09/11 0517) Temp Source: Oral (09/11 0517) BP: 118/76 (09/11 0517) Pulse Rate: 96 (09/11 0517)  Labs:  Recent Labs  11/15/16 0613  11/16/16 0030 11/16/16 0304 11/17/16 0731  HGB 16.6  --  16.1  --  15.3  HCT 47.2  --  47.3  --  45.7  PLT 109*  --  121*  --  PENDING  LABPROT 22.7*  --  24.6*  --  42.2*  INR 2.02  --  2.24  --  4.49*  HEPARINUNFRC 0.68  < > 0.56 0.51 0.32  CREATININE  --   --   --   --  1.36*  < > = values in this interval not displayed.  Estimated Creatinine Clearance: 51.1 mL/min (A) (by C-G formula based on SCr of 1.36 mg/dL (H)).   Assessment: 76 yo M presented with AMS, actively hallucinating, concern for Lewy Body dementia.  Noted to have subtherapeutic INR on admission, likely from noncompliance with medications with AMS.  Started on heparin infusion given mechanical AVR, now stopped. INR today is supratherapeutic at 4.49.  No bleeding noted, CBC is stable.   PTA dose warfarin: 1.5 mg TuThSat and 3mg  all other days (per [facility] visit on 8/7)  Goal of Therapy:  INR 2.5-3.5 for mechanical AVR + Afib Monitor platelets by anticoagulation protocol: Yes   Plan:   Hold warfarin tonight Monitor daily, INR, CBC, clinical course, s/sx of bleed, PO intake, DDI   Thank you for allowing [place] to participate in this patients care.  Jens Som, PharmD Clinical phone for 11/17/2016 from 7a-3:30p: [phone] If after 3:30p, please call main pharmacy at: [phone] 11/17/2016 9:21 AM

## 2016-11-17 NOTE — Progress Notes (Signed)
CRITICAL VALUE ALERT  Critical Value: Troponin 0.07  Date & Time Notied: 11/17/16 1700  Provider Notified: IMTS  Orders Received/Actions taken: EKG ordered

## 2016-11-17 NOTE — Progress Notes (Signed)
ANTIBIOTIC CONSULT NOTE - INITIAL  Pharmacy Consult for Vanco/Cefepime Indication: sepsis  Allergies  Allergen Reactions  . Lemon Oil Other (See Comments)    Only skin contact dermatitis    Patient Measurements: Height: 6\' 3"  (190.5 cm) Weight: 175 lb (79.4 kg) IBW/kg (Calculated) : 84.5 Adjusted Body Weight:    Vital Signs: Temp: 100.6 F (38.1 C) (09/11 1624) Temp Source: Axillary (09/11 1624) BP: 110/74 (09/11 1624) Pulse Rate: 97 (09/11 1624) Intake/Output from previous day: 09/10 0701 - 09/11 0700 In: 460 [P.O.:340; I.V.:120] Out: 300 [Urine:300] Intake/Output from this shift: Total I/O In: -  Out: 200 [Urine:200]  Labs:  Recent Labs  11/16/16 0030 11/17/16 0731 11/17/16 1524  WBC 8.3 14.8* 13.7*  HGB 16.1 15.3 15.7  PLT 121* 113* 89*  CREATININE  --  1.36* 1.51*   Estimated Creatinine Clearance: 46 mL/min (A) (by C-G formula based on SCr of 1.51 mg/dL (H)). No results for input(s): VANCOTROUGH, VANCOPEAK, VANCORANDOM, GENTTROUGH, GENTPEAK, GENTRANDOM, TOBRATROUGH, TOBRAPEAK, TOBRARND, AMIKACINPEAK, AMIKACINTROU, AMIKACIN in the last 72 hours.   Microbiology: No results found for this or any previous visit (from the past 720 hour(s)).  Medical History: Past Medical History:  Diagnosis Date  . Aortic aneurysm ([place]) 1994   From presumed Marfan's Syndrome.  Followed by Dr Stanford Breed.  Aneurysm involving the proximal descending thoracic aorta .    Marland Kitchen Atrial fibrillation ([place])    Followed by Dr Stanford Breed.  On coumadin.  . Atrial flutter ([place])    Followed by Dr Stanford Breed  . History of blood transfusion   . Hyperlipidemia   . Hypertension   . Marfan's syndrome with aortic dilation   . Osteoarthritis   . Pulmonary nodules    Found on chest CT 08/2007.  Monitoring with yearly CT.  3.55mm nodule in R iddle lobe.    Assessment:  ID: Temp 100.6 currently. WBC 13.7 elevated. Scr 1.51 (up). Pt less responsive. Start abx for sepis (PNA vs urinary vs  abdominal)  Antimicrobials this admission:  Vanco 9/11>> Cefepime 9/11>>  Dose adjustments this admission:    Microbiology results:  9/11 BCx:  9/11 UCx:   Goal of Therapy:  Vancomycin trough level 15-20 mcg/ml  Plan:  Cefepime 2g IV q24h Vanco 1000mg  IV x 1 then 750mg  IV q12 hrs F/u cultures Vanco trough after 3-5 doses at steady state.  Juan Wilkerson S. Alford Highland, PharmD, BCPS Clinical Staff Pharmacist Pager [phone]  Juan Wilkerson 11/17/2016,5:45 PM

## 2016-11-17 NOTE — Progress Notes (Signed)
Patient less responsive today.  Only responding to pain stimulus.  MD's came to bedside. Report given to [place], Therapist, sports.  Patient transferred to stepdown.

## 2016-11-17 NOTE — Progress Notes (Signed)
Occupational Therapy Treatment Patient Details Name: Juan Wilkerson MRN: [id number] DOB: May 16, 1940 Today's Date: 11/17/2016    History of present illness Juan Wilkerson is a 76 y.o. male presenting with altered mental status. PMH is significant for atrial flutter/fibrillation on warfarin, hypertension, hyperlipidemia, ?Marfan's syndrome, Aortic valve replacement, microscopic hematuria, left renal mass   OT comments  Pt with lethargy, primarily keeping his eyes closed throughout session. Pt not initiating movement, requiring +2 total assist to achieve sitting EOB. Demonstrated poor sitting balance. Pt took several sips of juice from cup and then straw with assist. Pt continues to be appropriate for d/c to SNF. Will follow.  Follow Up Recommendations  SNF;Supervision/Assistance - 24 hour    Equipment Recommendations       Recommendations for Other Services      Precautions / Restrictions Precautions Precautions: Fall Restrictions Weight Bearing Restrictions: No       Mobility Bed Mobility Overal bed mobility: Needs Assistance Bed Mobility: Supine to Sit;Sit to Supine     Supine to sit: +2 for physical assistance;Total assist Sit to supine: +2 for physical assistance;Total assist   General bed mobility comments: assist for all aspects, no attempt to initiate or ability to fall commands  Transfers Overall transfer level: Needs assistance Equipment used: 2 person hand held assist Transfers: Sit to/from Stand           General transfer comment: attempted to stand from EOB x 1, pt without initiation     Balance Overall balance assessment: Needs assistance Sitting-balance support: Feet supported;Single extremity supported Sitting balance-Leahy Scale: Poor Sitting balance - Comments: heavy mod assist with posterior and L side bias Postural control: Posterior lean;Left lateral lean   Standing balance-Leahy Scale: Zero Standing balance comment: unable to stand this visit                           ADL either performed or assessed with clinical judgement   ADL Overall ADL's : Needs assistance/impaired Eating/Feeding: Maximal assistance Eating/Feeding Details (indicate cue type and reason): required drinking from edge of cup prior to being able to use straw, hand over hand to hold cup initially                                   General ADL Comments: Pt in bed. Did not appear to have eaten breakfast. Mitts on hands.     Vision       Perception     Praxis      Cognition Arousal/Alertness: Lethargic Behavior During Therapy: Flat affect Overall Cognitive Status: No family/caregiver present to determine baseline cognitive functioning                                 General Comments: pt with minimal verbalization, keeping eyes closed most of session        Exercises     Shoulder Instructions       General Comments      Pertinent Vitals/ Pain       Pain Assessment: Faces Faces Pain Scale: No hurt  Home Living  Prior Functioning/Environment              Frequency  Min 1X/week        Progress Toward Goals  OT Goals(current goals can now be found in the care plan section)  Progress towards OT goals: Not progressing toward goals - comment (pt with lethargy this visit)  Acute Rehab OT Goals Patient Stated Goal: none stated OT Goal Formulation: Patient unable to participate in goal setting Time For Goal Achievement: 11/27/16 Potential to Achieve Goals: [place] Discharge plan remains appropriate    Co-evaluation    PT/OT/SLP Co-Evaluation/Treatment: Yes Reason for Co-Treatment: Necessary to address cognition/behavior during functional activity;For patient/therapist safety   OT goals addressed during session: ADL's and self-care;Strengthening/ROM      AM-PAC PT "6 Clicks" Daily Activity     Outcome Measure   Help from  another person eating meals?: A Lot Help from another person taking care of personal grooming?: Total Help from another person toileting, which includes using toliet, bedpan, or urinal?: Total Help from another person bathing (including washing, rinsing, drying)?: Total Help from another person to put on and taking off regular upper body clothing?: Total Help from another person to put on and taking off regular lower body clothing?: Total 6 Click Score: 7    End of Session Equipment Utilized During Treatment: Gait belt  OT Visit Diagnosis: Muscle weakness (generalized) (M62.81);Other symptoms and signs involving cognitive function;Cognitive communication deficit (R41.841)   Activity Tolerance Patient limited by lethargy   Patient Left in bed;with call bell/phone within reach;with bed alarm set;with restraints reapplied   Nurse Communication          Time: 2947-6546 OT Time Calculation (min): 24 min  Charges: OT General Charges $OT Visit: 1 Visit OT Treatments $Therapeutic Activity: 8-22 mins     Malka So 11/17/2016, 1:32 PM  11/17/2016 Nestor Lewandowsky, OTR/L Pager: [phone]

## 2016-11-17 NOTE — Progress Notes (Signed)
FPTS Interim Progress Note  S: Went to check on the patient. On arrival in his room, patient is being cleaned up after enema. When asked about his name, his chart saying random words we didn't make sense. Asked him if he is hurting. He says "not really".  Per RN, no acute concerns at this time.   O: BP 110/74 (BP Location: Right Arm)   Pulse 97   Temp (!) 100.6 F (38.1 C) (Axillary)   Resp (!) 21   Ht 6\' 3"  (1.905 m)   Wt 175 lb (79.4 kg)   SpO2 98%   BMI 21.87 kg/m   GEN: Lying in bed, no apparent distress, confused CVS: Tachycardia, RR, nl s1 & s2, no murmurs, no edema RESP: no IWOB, good air movement bilaterally, CTAB GI: BS present & normal, soft, NTND GU: Condom catheter in place. MSK: no focal tenderness or notable swelling SKIN: Mild skin erythema over his backside. No obvious skin break or sign of infection NEURO: alert and awake but not oriented. No gross deficit PSYCH: calm but totally disoriented  A/P: Patient with altered mental status (audiovisual hallucination on admission) concerning for dementia. Now with fever, leukocytosis and urinalysis concerning for UTI. All picture concerning for sepsis but he looks well except for disorientation. Also with mild troponemia to 0.07. He is hemodynamically stable except for mild tachycardia -Continue risperidone for psychosis. Seroquel when necessary for agitation -Started broad-spectrum antibiotics (vancomycin and cefepime) for possible sepsis -Cycle troponin. Will obtain EKG -Updated his brothers over the phone.   Mercy Riding, MD 11/17/2016, 6:41 PM PGY-3, [facility] Service pager [phone]

## 2016-11-17 NOTE — Progress Notes (Signed)
Family Medicine Teaching Service Daily Progress Note Intern Pager: [phone]  Patient name: Juan Wilkerson Medical record number: [id number] Date of birth: January 08, 1941 Age: 76 y.o. Gender: male  Primary Care Provider: Lovenia Kim, MD Consultants: Psychiatry Code Status: FULL CODE  Pt Overview and Major Events to Date:  Juan Wilkerson is a 76 y.o. male presenting with altered mental status. PMH is significant for atrial flutter/fibrillation on warfarin, hypertension, hyperlipidemia, Marfan's syndrome, Aortic valve replacement, microscopic hematuria, left renal mass 9/6- Psychiatric assessment recommended inpatient placement ([facility])  Assessment and Plan:  Altered mental status: He has tolerated partial restraint removal (in mittens) without significant behavioral problems. Tangential thought content during conversation. MRI showed No acute intracranial abnormality. Small remote right posterior cortical and left cerebellar Infarcts. Mild generalized cerebral atrophy with nonspecific cerebral white matter changes, mildly progressed relative to most recent MRI from 2015. 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 He needs to be at goal for at least 2 days before we can continued on warfarin alone. Likely due to nonadherence from mental status. -Coumadin 5mg  PM -Repeat INR in AM - D/c Heparin gtt, pharmacy assistance for monitoring and titration of anticoagulation meds is appreciated  Hypovolemia: Patient appears dry on exam and has slight increase in Cr from 0.98 > 1.36 on 09/11. WBC count increased from 8.3> 14.8 on 09/11.  - Monitor, give IVF bolus - Patient remains afebrile  with normal vitals. Repeat CBC.  Atrial flutter/fibrillation without RVR: Rate controlled on metoprolol.  - Anticoagulation as above. - Metoprolol 25 mg BID  FEN/GI: Heart healthy diet VTE Prophylaxis: Coumadin  Disposition: Inpatient psychiatric admission  Subjective:  Juan Wilkerson is very somnolent and sleepy. Hard to awaken and keeps falling back asleep. He cannot tell me where he is or his name. He does say he doesn't hurt anywhere. Hard to obtain any history.  Objective: Temp:  [97.9 F (36.6 C)] 97.9 F (36.6 C) (09/11 0517) Pulse Rate:  [96] 96 (09/11 0517) BP: (118)/(76) 118/76 (09/11 0517) SpO2:  [96 %] 96 % (09/11 0517) Physical Exam: General: NAD, laying comfortably, in mittens, hard to awaken HEENT: dry mucous membranes Cardiovascular: Irregular rhythm, normal rate, loud S2 Respiratory: CTAB, normal WOB Extremities: no injuries noted on exam Neuro: Disoriented and sleepy Psych: Answers demonstrate tangential thought contents or word searching   Laboratory:  Recent Labs Lab 11/15/16 0613 11/16/16 0030 11/17/16 0731  WBC 7.5 8.3 14.8*  HGB 16.6 16.1 15.3  HCT 47.2 47.3 45.7  PLT 109* 121* PENDING    Recent Labs Lab 11/11/16 1430 11/17/16 0731  NA 140 138  K 3.8 3.7  CL 106 107  CO2 23 22  BUN 20 36*  CREATININE 0.98 1.36*  CALCIUM 9.2 9.2  PROT 6.7  --   BILITOT 1.4*  --   ALKPHOS 83  --   ALT 13*  --   AST 21  --   GLUCOSE 107* 147*   INR: 4.49  Mr Brain Wo Contrast  Result Date: 11/16/2016 CLINICAL DATA:  Initial evaluation for acute altered mental status. EXAM: MRI HEAD WITHOUT CONTRAST TECHNIQUE: Multiplanar, multiecho pulse sequences of the brain and surrounding structures were obtained without intravenous contrast. COMPARISON:  Comparison made with prior CT from 11/11/2016  as well as previous MRI from 05/11/2013. FINDINGS: Brain: Mild diffuse prominence of the CSF containing spaces compatible with generalized cerebral atrophy. Patchy and  confluent nonspecific cerebral white matter changes present within the periventricular and deep white matter both cerebral hemispheres changes are mildly progressed relative to 2015. Small remote cortical/ subcortical infarct noted at the posterior right frontal lobe, precentral gyrus (series 8, image 20). Small remote left cerebellar infarct noted as well. No evidence for acute or subacute ischemia. Gray-white matter differentiation maintained. No evidence for acute intracranial hemorrhage. Two subcentimeter chronic micro hemorrhages noted within the left cerebellum and right paramedian pons. No mass lesion, midline shift or mass effect. No hydrocephalus. No extra-axial fluid collection. Major dural sinuses are grossly patent. Pituitary gland suprasellar region within normal limits. Midline structures intact and normal. Vascular: Major intracranial vascular flow voids are maintained. Skull and upper cervical spine: Craniocervical junction within normal limits. Visualized upper cervical spine unremarkable. Bone marrow signal intensity within normal limits. No scalp soft tissue abnormality. Sinuses/Orbits: Globes and orbital soft tissues within normal limits. Paranasal sinuses are clear. No mastoid effusion. Inner ear structures normal. Other: None. IMPRESSION: 1. No acute intracranial abnormality. 2. 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 3. Mild generalized cerebral atrophy with nonspecific cerebral white matter changes, mildly progressed relative to most recent MRI from 2015. Electronically Signed   By: Jeannine Boga M.D.   On: 11/16/2016 22:23    Kodie Kishi, [place], DO 11/17/2016, 9:18 AM PGY-1, [facility] Intern pager: [phone], text pages welcome

## 2016-11-17 NOTE — Progress Notes (Signed)
FPTS Interim Progress Note  S: Multiple team members evaluated patient this afternoon - seems to have had declining awareness throughout the day. On my afternoon rounds, only able to intermittently groan and grimace. No verbalizations. Repeat BP 91/56. After sternal rub, able to state NO to pain, along with a few unintelligible mumbles.  Spoke with brother Juan Wilkerson - reports Mom with Alzheimers on death certificate. Patient has an adopted daughter who is estranged, brother thinks she is his legal next of kin. Name is Juan Wilkerson, brother has no contact info. Patient is Juan Wilkerson, brother has talked to his pastor. Best friend is Juan Wilkerson. Juan Wilkerson's daughter is a Chief Executive Officer. Juan Wilkerson will reach out to friends regarding any info on [place]. Juan Wilkerson does not recall the patient stating anything about his goals of care.  O: BP (!) 95/52 (BP Location: Left Arm)   Pulse 89   Temp 99.8 F (37.7 C) (Oral)   Resp 17   Ht 6\' 3"  (1.905 m)   Wt 175 lb (79.4 kg)   SpO2 95%   BMI 21.87 kg/m   Gen: ill appearing elderly male, eyes closed  CV: RRR, no murmur Resp: CTAB, easy WOB Abd: +distention, +TTP over bilateral lower quadrants, no fluid wave, +BS Extremities: no edema, warm Neuro: responds to pain, cannot follow commands  A/P:  AMS: given abdominal pain on exam, consider constipation vs obstruction vs perforation (less likely as pain is not severe on exam). With leukocytosis, considering PNA or UTI or other infectious process.  -STAT ABG -additional 1L NS bolus -Rapid response for transfer to stepdown and  -UA -CXR and KUB STAT -CMP, ammonia, troponin, lactic acid STAT -blood cutlures x 2 and urine culture -recheck mental status this evening -consider CT head if no improvement   Goals of care: given acute decline, began goals of care discussion with brother, Juan Wilkerson, who does not know the patient's wishes. We will continue to revisit this and patient will remain full code.    [name], MD 11/17/2016,  2:49 PM PGY-2, [facility] Service pager [phone]

## 2016-11-17 NOTE — Progress Notes (Signed)
Initial Nutrition Assessment  DOCUMENTATION CODES:   Not applicable  INTERVENTION:   -If pt too lethargic to take PO's, consider placement of temporary NGT (ex cortrak) for enteral nutrition support. Recommend initiate Jevity 1.2 @ 25 ml/hr and increase by 10 ml every 4 hours to goal rate of 55 ml/hr with 30 ml Prostat BID.  Tube feeding regimen provides 2180 kcal (100% of needs), 114 grams of protein, and 1003 ml of H2O.   NUTRITION DIAGNOSIS:   Inadequate oral intake related to lethargy/confusion as evidenced by meal completion < 50%.  GOAL:   Patient will meet greater than or equal to 90% of their needs  MONITOR:   PO intake, Labs, Weight trends, Skin, I & O's  REASON FOR ASSESSMENT:   Low Braden    ASSESSMENT:   HATIM HOMANN is a 76 y.o. male presenting with altered mental status. PMH is significant for atrial flutter/fibrillation on warfarin, hypertension, hyperlipidemia, ?Marfan's syndrome, Aortic valve replacement, microscopic hematuria, left renal mass  Pt admitted with AMS.   Per chart review, pt with poor mentation. Per nursing notes, pt spit out pills yesterday morning. Pt only responding to pain stimulus. Multiple clinicians in room at time of visit; plan to transfer to SDU.   Wt hx reviewed; noted pt has experienced a 8.9% wt loss over the past 7 months. While this is not significant for time frame, it is concerning when coupled with poor oral intake and mental status changes.   Meal completion variable; noted averaging around 40% of meals, however, noted multiple instances of 0% meal intake, likely due to lethargy.   Unable to complete Nutrition-Focused physical exam at this time.   Labs reviewed.   Diet Order:  Diet Heart Room service appropriate? Yes; Fluid consistency: Thin  Skin:  Wound (see comment) (DPTI rt heel)  Last BM:  11/14/16  Height:   Ht Readings from Last 1 Encounters:  11/11/16 6\' 3"  (1.905 m)    Weight:   Wt Readings from Last 1  Encounters:  11/11/16 175 lb (79.4 kg)    Ideal Body Weight:  89.1 kg  BMI:  Body mass index is 21.87 kg/m.  Estimated Nutritional Needs:   Kcal:  2536-6440  Protein:  110-125 grams  Fluid:  2.3-2.5 L  EDUCATION NEEDS:   Education needs no appropriate at this time  Anice Wilshire A. Jimmye Norman, RD, LDN, CDE Pager: [phone] After hours Pager: [phone]

## 2016-11-18 ENCOUNTER — Encounter (HOSPITAL_COMMUNITY): Payer: Self-pay | Admitting: Internal Medicine

## 2016-11-18 DIAGNOSIS — E440 Moderate protein-calorie malnutrition: Secondary | ICD-10-CM | POA: Insufficient documentation

## 2016-11-18 LAB — BASIC METABOLIC PANEL
ANION GAP: 6 (ref 5–15)
ANION GAP: 6 (ref 5–15)
BUN: 43 mg/dL — ABNORMAL HIGH (ref 6–20)
BUN: 49 mg/dL — ABNORMAL HIGH (ref 6–20)
CALCIUM: 9 mg/dL (ref 8.9–10.3)
CO2: 25 mmol/L (ref 22–32)
CO2: 21 mmol/L — ABNORMAL LOW (ref 22–32)
CREATININE: 1.47 mg/dL — AB (ref 0.61–1.24)
Calcium: 8.9 mg/dL (ref 8.9–10.3)
Chloride: 109 mmol/L (ref 101–111)
Chloride: 112 mmol/L — ABNORMAL HIGH (ref 101–111)
Creatinine, Ser: 1.23 mg/dL (ref 0.61–1.24)
GFR, EST AFRICAN AMERICAN: 51 mL/min — AB (ref >60)
GFR, EST NON AFRICAN AMERICAN: 44 mL/min — AB (ref >60)
GFR, EST NON AFRICAN AMERICAN: 55 mL/min — AB (ref >60)
GLUCOSE: 144 mg/dL — AB (ref 65–99)
Glucose, Bld: 129 mg/dL — ABNORMAL HIGH (ref 65–99)
POTASSIUM: 3.5 mmol/L (ref 3.5–5.1)
Potassium: 3.7 mmol/L (ref 3.5–5.1)
SODIUM: 139 mmol/L (ref 135–145)
Sodium: 140 mmol/L (ref 135–145)

## 2016-11-18 LAB — CBC
HEMATOCRIT: 46.8 % (ref 39.0–52.0)
HEMOGLOBIN: 15.5 g/dL (ref 13.0–17.0)
MCH: 30.6 pg (ref 26.0–34.0)
MCHC: 33.1 g/dL (ref 30.0–36.0)
MCV: 92.3 fL (ref 78.0–100.0)
Platelets: 94 10*3/uL — ABNORMAL LOW (ref 150–400)
RBC: 5.07 MIL/uL (ref 4.22–5.81)
RDW: 14.1 % (ref 11.5–15.5)
WBC: 14.2 10*3/uL — AB (ref 4.0–10.5)

## 2016-11-18 LAB — BLOOD GAS, ARTERIAL
Acid-base deficit: 0.2 mmol/L (ref 0.0–2.0)
Bicarbonate: 23.3 mmol/L (ref 20.0–28.0)
DRAWN BY: 235881
FIO2: 21
O2 Saturation: 95.9 %
PH ART: 7.452 — AB (ref 7.350–7.450)
Patient temperature: 98.6
pCO2 arterial: 33.9 mmHg (ref 32.0–48.0)
pO2, Arterial: 76.8 mmHg — ABNORMAL LOW (ref 83.0–108.0)

## 2016-11-18 LAB — TROPONIN I
TROPONIN I: 0.08 ng/mL — AB (ref <0.03)
TROPONIN I: 0.08 ng/mL — AB (ref <0.03)

## 2016-11-18 LAB — MAGNESIUM: Magnesium: 2.3 mg/dL (ref 1.7–2.4)

## 2016-11-18 LAB — PROTIME-INR
INR: 5.59 — AB
PROTHROMBIN TIME: 49.8 s — AB (ref 11.4–15.2)

## 2016-11-18 MED ORDER — SODIUM CHLORIDE 0.9 % IV SOLN
INTRAVENOUS | Status: AC
  Administered 2016-11-18: 09:00:00 via INTRAVENOUS

## 2016-11-18 MED ORDER — SODIUM CHLORIDE 0.9 % IV SOLN: INTRAVENOUS | Status: DC

## 2016-11-18 MED ORDER — POLYETHYLENE GLYCOL 3350 17 G PO PACK
17.0000 g | PACK | Freq: Every day | ORAL | Status: DC
  Administered 2016-11-18 – 2016-12-02 (×13): 17 g via ORAL
  Filled 2016-11-18 (×14): qty 1

## 2016-11-18 MED ORDER — ACETAMINOPHEN 325 MG PO TABS
650.0000 mg | ORAL_TABLET | Freq: Four times a day (QID) | ORAL | Status: DC | PRN
  Administered 2016-11-24 (×2): 650 mg via ORAL
  Filled 2016-11-18 (×2): qty 2

## 2016-11-18 MED ORDER — ORAL CARE MOUTH RINSE
15.0000 mL | Freq: Two times a day (BID) | OROMUCOSAL | Status: DC
  Administered 2016-11-18 – 2016-12-02 (×21): 15 mL via OROMUCOSAL

## 2016-11-18 MED ORDER — PRO-STAT SUGAR FREE PO LIQD
30.0000 mL | Freq: Three times a day (TID) | ORAL | Status: DC
  Administered 2016-11-18 – 2016-11-19 (×4): 30 mL via ORAL
  Administered 2016-11-20: 22:00:00 via ORAL
  Administered 2016-11-20 – 2016-11-30 (×22): 30 mL via ORAL
  Filled 2016-11-18 (×31): qty 30

## 2016-11-18 MED ORDER — ADULT MULTIVITAMIN W/MINERALS CH
1.0000 | ORAL_TABLET | Freq: Every day | ORAL | Status: DC
  Administered 2016-11-18 – 2016-12-02 (×15): 1 via ORAL
  Filled 2016-11-18 (×16): qty 1

## 2016-11-18 NOTE — Progress Notes (Signed)
CRITICAL VALUE ALERT  Critical Value:  INR 5.59     Date & Time Notied:  11/18/16 0520  Provider Notified: Family Medicine  Orders Received/Actions taken: monitor

## 2016-11-18 NOTE — Progress Notes (Signed)
ANTICOAGULATION CONSULT NOTE - Follow Up Consult  Pharmacy Consult for Warfarin Indication: atrial fibrillation and mechanical AVR   Patient Measurements: Height: 6\' 3"  (190.5 cm) Weight: 175 lb (79.4 kg) IBW/kg (Calculated) : 84.5 Heparin Dosing Weight: 79.4 kg  Vital Signs: Temp: 97.5 F (36.4 C) (09/12 0700) Temp Source: Axillary (09/12 0700) BP: 122/88 (09/12 0700) Pulse Rate: 67 (09/12 0700)  Labs:  Recent Labs  11/16/16 0030 11/16/16 0304 11/17/16 0731 11/17/16 1524 11/17/16 2126 11/18/16 0346  HGB 16.1  --  15.3 15.7  --  15.5  HCT 47.3  --  45.7 47.1  --  46.8  PLT 121*  --  113* 89*  --  94*  LABPROT 24.6*  --  42.2*  --   --  49.8*  INR 2.24  --  4.49*  --   --  5.59*  HEPARINUNFRC 0.56 0.51 0.32  --   --   --   CREATININE  --   --  1.36* 1.51*  --  1.47*  TROPONINI  --   --   --  0.07* 0.08* 0.08*    Estimated Creatinine Clearance: 47.3 mL/min (A) (by C-G formula based on SCr of 1.47 mg/dL (H)).   Assessment: 76 yo M on warfarin pta for mechanical AVR and afib, admitted with subtherapeutic INR, likely from noncompliance with medications with AMS.  He was started on a heparin bridge. We've been giving him higher 5mg  doses. Yesterday INR doubled to 4.49 and heparin was stopped. INR continues to trend up to 5.59 today. CBC remains stable. No bleeding reported.  PTA dose warfarin: 1.5 mg TuThSat and 3mg  all other days (per [facility] visit on 8/7)  Goal of Therapy:  INR 2.5-3.5 for mechanical AVR + Afib Monitor platelets by anticoagulation protocol: Yes   Plan:   1) Hold warfarin tonight 2) Daily INR   Thank you for allowing [place] to participate in this patients care.  Nena Jordan, PharmD, BCPS 11/18/2016 9:02 AM

## 2016-11-18 NOTE — Progress Notes (Addendum)
FPTS Interim Progress Note  S:  Received call from nursing about patient in [place] with HR in 40s, BP 92/65, and minimal response to painful stimuli. Asked to obtain EKG, stat trop, stat BMP, and stat ABG.   Upon my arrival, his heart rate was in the upper 60s with stable blood pressures.   O: BP 122/88 (BP Location: Right Arm)   Pulse 67   Temp 98.6 F (37 C) (Axillary)   Resp (!) 21   Ht 6\' 3"  (1.905 m)   Wt 175 lb (79.4 kg)   SpO2 97%   BMI 21.87 kg/m   Gen: initially does not respond to voice. Verbalizes pain to sternal rub. This actually improved shortly after; patient would say no if asked if he is in pain. He did open his eyes to verbal request but after many attempts. He withdraws to pain and does at times squeeze my fingers when asked. On re-eval, he is able to tell me his name. He keeps his eyes closed. HEENT: pupils equal size and reactive to light.   CV: irregularly irregular Lung: he is protecting his airway. Normal wob, ctab.    A/P: EKG with a flutter. HR in the upper 60s now with stable blood pressure He is more responsive than before just sleepy. He is able to tell me his name. Currently on IV Cefepime for UTI. I do not think we need to restart Vancomycin which was d/c earlier today. Receiving IVF. ABG okay. I wonder if Risperidone is contributing to his drowsiness.   - awaiting STAT trop and BMP - will consider cards consult with his cardiac history. Will wait for trop.  - continued close monitoring   Smiley Houseman, MD 11/18/2016, 1:04 PM PGY-3, [place] Service pager [phone]

## 2016-11-18 NOTE — Consult Note (Signed)
[facility] Consult   Reason for Consult:  AMS and hallucinations Referring Physician:  Dr. Nori Riis Patient Identification: Juan Wilkerson MRN:  [id number] Principal Diagnosis: Altered mental status Diagnosis:   Patient Active Problem List   Diagnosis Date Noted  . S/P AVR (aortic valve replacement) [Z95.2]   . Chronic anticoagulation [Z79.01]   . 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 Hallucinations [R44.3]   . Pressure injury of skin [L89.90] 11/12/2016  . Altered mental status [R41.82] 11/11/2016  . Constipation [K59.00] 09/12/2015  . Upper airway cough syndrome [R05] 04/16/2015  . Left renal mass [N28.89]   . 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 Traumatic ecchymosis of multiple sites [T14.8XXA] 12/03/2014  . Easy bruising [R23.8] 12/03/2014  . Arthritis of knee, left [M17.12] 12/15/2013  . Colon cancer screening [Z12.11] 08/28/2013  . Word finding difficulty [R47.89] 08/28/2013  . Microscopic hematuria [R31.29] 11/29/2011  . Long term (current) use of anticoagulants [Z79.01] 04/19/2010  . AORTIC VALVE REPLACEMENT, HX OF [Z95.4] 01/24/2010  . ATRIAL FLUTTER [I48.92] 07/15/2009  . ESSENTIAL HYPERTENSION, BENIGN [I10] 04/29/2009  . Atrial fibrillation ([place]) [I48.91] 04/29/2009  . Aneurysm of thoracic aorta ([place]) [I71.2] 04/29/2009  . Degenerative arthritis of knee, bilateral [M17.0] 04/10/2009  . 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 HYPERCHOLESTEROLEMIA [E78.00] 07/22/2006  . DEGENERATIVE JOINT DISEASE, BOTH KNEES, SEVERE [M17.10] 07/22/2006    Total Time spent with patient: 1 hour  Subjective:   Juan Wilkerson is a 76 y.o. male patient admitted with AMS.  HPI: Juan Wilkerson is a 76 years old male admitted to the [facility] with altered mental status and both auditory and visual hallucinations observed by the neighbors. Reportedly has been talking to himself and could not make meaningful conversation. Patient started talking  about seeing shadows and people were multiple doing different things. Patient reported that investigating. Patient is overall poor historian. Patient was seen today because of recent diagnosis of urinary tract infections and his mentation has been in and out. Staff RN reported at 11:00 this was able to respond verbally even though not meaningfully and then finds him couple of hours later could not wake him up and required stent. It is not clear his antipsychotic medication risperidone 0.25 mg twice daily started on June 6 has any impact on his mental status , which was currently discontinued. Patient has no family members in contact with him. He has one brother who has been in contact with the social service from [place]. Believe his recent change in mental status probably secondary to Urinary tract infection.   Risk to Self: Is patient at risk for suicide?: No Risk to Others:   Prior Inpatient Therapy:   Prior Outpatient Therapy:    Past Medical History:  Past Medical History:  Diagnosis Date  . Aortic aneurysm ([place]) 1994   From presumed Marfan's Syndrome.  Followed by Dr Stanford Breed.  Aneurysm involving the proximal descending thoracic aorta .    Marland Kitchen Atrial fibrillation ([place])    Followed by Dr Stanford Breed.  On coumadin.  . Atrial flutter ([place])    Followed by Dr Stanford Breed  . History of blood transfusion   . Hyperlipidemia   . Hypertension   . Marfan's syndrome with aortic dilation   . Osteoarthritis   . Pulmonary nodules    Found on chest CT 08/2007.  Monitoring with yearly CT.  3.46m nodule in R iddle lobe.    Past Surgical History:  Procedure Laterality Date  .  AORTIC VALVE REPLACEMENT  03/1993   St Jude Valve  . CARDIAC VALVE REPLACEMENT    . CORONARY ARTERY BYPASS GRAFT    . HIP FRACTURE SURGERY  03/1992   s/p R hip Fx  . IR GENERIC HISTORICAL  04/01/2016   IR RADIOLOGIST EVAL & MGMT 04/01/2016 Aletta Edouard, MD [facility] INTERV RAD  . KIDNEY SURGERY    . VASCULAR SURGERY    . Virtual  Colonoscopy  11/01/2005   Pt on chronic coumadin for Aortic valve replacement and Atrial fibrilliation therefore at high risk if stopped.  Need to repeat every 5 yrs.    Family History:  Family History  Problem Relation Age of Onset  . Cancer Brother   . Hypertension Brother   . Aortic aneurysm Maternal Aunt    Family Psychiatric  History: Unknown Social History:  History  Alcohol Use No     History  Drug Use No    Social History   Social History  . Marital status: Divorced    Spouse name: N/A  . Number of children: 0  . Years of education: 72   Occupational History  . [employer] Retired  . 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 Smoking status: Never Smoker  . Smokeless tobacco: Never Used  . Alcohol use No  . Drug use: No  . Sexual activity: No   Other Topics Concern  . None   Social History Narrative   Divorced, lives alone. Has a daughter (pt does not have contact with her, has not seen her since she was 70).    Studied music in college.   Plays oboe with [employer].    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   No smoking. No alcohol use. No recreational drugs.      Health Care POA:    Emergency Contact: Juan Wilkerson [phone]   End of Life Plan: Gave pt the ad pamplet   Who lives with you: Lives by himself   Any pets: 0   Diet: pt diet varies   Exercise: walks and going up and down stairs most days   Seatbelts: pt uses seat belt when in his car   Sun Exposure/Protection: pt uses hats and long sleeves   Hobbies: harmonica                  Additional Social History:    Allergies:   Allergies  Allergen Reactions  . Lemon Oil Other (See Comments)    Only skin contact dermatitis    Labs:  Results for orders placed or performed during the hospital encounter of 11/11/16 (from the past 48 hour(s))  CBC     Status: Abnormal   Collection Time: 11/17/16  7:31 AM  Result Value Ref Range    WBC 14.8 (H) 4.0 - 10.5 K/uL   RBC 5.02 4.22 - 5.81 MIL/uL   Hemoglobin 15.3 13.0 - 17.0 g/dL   HCT 45.7 39.0 - 52.0 %   MCV 91.0 78.0 - 100.0 fL   MCH 30.5 26.0 - 34.0 pg   MCHC 33.5 30.0 - 36.0 g/dL   RDW 13.9 11.5 - 15.5 %   Platelets 113 (L) 150 - 400 K/uL    Comment: PLATELET COUNT CONFIRMED BY SMEAR  Protime-INR     Status: Abnormal   Collection Time: 11/17/16  7:31 AM  Result Value Ref Range   Prothrombin Time 42.2 (H) 11.4 - 15.2  seconds   INR 4.49 (HH)     Comment: REPEATED TO VERIFY CRITICAL RESULT CALLED TO, READ BACK BY AND VERIFIED WITH: N.CHAMBERLAND RN [id number] 11/17/16 HDAY   Heparin level (unfractionated)     Status: None   Collection Time: 11/17/16  7:31 AM  Result Value Ref Range   Heparin Unfractionated 0.32 0.30 - 0.70 IU/mL    Comment:        IF HEPARIN RESULTS ARE BELOW EXPECTED VALUES, AND PATIENT DOSAGE HAS BEEN CONFIRMED, SUGGEST FOLLOW UP TESTING OF ANTITHROMBIN III LEVELS.   Basic metabolic panel     Status: Abnormal   Collection Time: 11/17/16  7:31 AM  Result Value Ref Range   Sodium 138 135 - 145 mmol/L   Potassium 3.7 3.5 - 5.1 mmol/L   Chloride 107 101 - 111 mmol/L   CO2 22 22 - 32 mmol/L   Glucose, Bld 147 (H) 65 - 99 mg/dL   BUN 36 (H) 6 - 20 mg/dL   Creatinine, Ser 1.36 (H) 0.61 - 1.24 mg/dL   Calcium 9.2 8.9 - 10.3 mg/dL   GFR calc non Af Amer 49 (L) >60 mL/min   GFR calc Af Amer 56 (L) >60 mL/min    Comment: (NOTE) The eGFR has been calculated using the CKD EPI equation. This calculation has not been validated in all clinical situations. eGFR's persistently <60 mL/min signify possible Chronic Kidney Disease.    Anion gap 9 5 - 15  Culture, blood (Routine X 2) w Reflex to ID Panel     Status: None (Preliminary result)   Collection Time: 11/17/16  3:24 PM  Result Value Ref Range   Specimen Description BLOOD LEFT HAND    Special Requests Blood Culture adequate volume    Culture NO GROWTH < 24 HOURS    Report Status PENDING    Culture, blood (Routine X 2) w Reflex to ID Panel     Status: None (Preliminary result)   Collection Time: 11/17/16  3:24 PM  Result Value Ref Range   Specimen Description BLOOD RIGHT HAND    Special Requests Blood Culture adequate volume    Culture NO GROWTH < 24 HOURS    Report Status PENDING   Troponin I     Status: Abnormal   Collection Time: 11/17/16  3:24 PM  Result Value Ref Range   Troponin I 0.07 (HH) <0.03 ng/mL    Comment: CRITICAL RESULT CALLED TO, READ BACK BY AND VERIFIED WITH: K.BUCKNUM,RN _0  09.11.18 SPIKESN   Ammonia     Status: None   Collection Time: 11/17/16  3:24 PM  Result Value Ref Range   Ammonia 30 9 - 35 umol/L  CBC     Status: Abnormal   Collection Time: 11/17/16  3:24 PM  Result Value Ref Range   WBC 13.7 (H) 4.0 - 10.5 K/uL   RBC 5.16 4.22 - 5.81 MIL/uL   Hemoglobin 15.7 13.0 - 17.0 g/dL   HCT 47.1 39.0 - 52.0 %   MCV 91.3 78.0 - 100.0 fL   MCH 30.4 26.0 - 34.0 pg   MCHC 33.3 30.0 - 36.0 g/dL   RDW 14.0 11.5 - 15.5 %   Platelets 89 (L) 150 - 400 K/uL    Comment: SPECIMEN CHECKED FOR CLOTS REPEATED TO VERIFY CONSISTENT WITH PREVIOUS RESULT   Comprehensive metabolic panel     Status: Abnormal   Collection Time: 11/17/16  3:24 PM  Result Value Ref Range   Sodium 138 135 - 145 mmol/L  Potassium 3.7 3.5 - 5.1 mmol/L   Chloride 107 101 - 111 mmol/L   CO2 21 (L) 22 - 32 mmol/L   Glucose, Bld 132 (H) 65 - 99 mg/dL   BUN 40 (H) 6 - 20 mg/dL   Creatinine, Ser 1.51 (H) 0.61 - 1.24 mg/dL   Calcium 8.8 (L) 8.9 - 10.3 mg/dL   Total Protein 6.1 (L) 6.5 - 8.1 g/dL   Albumin 3.1 (L) 3.5 - 5.0 g/dL   AST 23 15 - 41 U/L   ALT 19 17 - 63 U/L   Alkaline Phosphatase 91 38 - 126 U/L   Total Bilirubin 1.3 (H) 0.3 - 1.2 mg/dL   GFR calc non Af Amer 43 (L) >60 mL/min   GFR calc Af Amer 50 (L) >60 mL/min    Comment: (NOTE) The eGFR has been calculated using the CKD EPI equation. This calculation has not been validated in all clinical situations. eGFR's  persistently <60 mL/min signify possible Chronic Kidney Disease.    Anion gap 10 5 - 15  Urinalysis, Routine w reflex microscopic     Status: Abnormal   Collection Time: 11/17/16  3:41 PM  Result Value Ref Range   Color, Urine ORANGE (A) YELLOW    Comment: BIOCHEMICALS MAY BE AFFECTED BY COLOR   APPearance TURBID (A) CLEAR   Specific Gravity, Urine 1.020 1.005 - 1.030   pH 6.5 5.0 - 8.0   Glucose, UA NEGATIVE NEGATIVE mg/dL   Hgb urine dipstick LARGE (A) NEGATIVE   Bilirubin Urine NEGATIVE NEGATIVE   Ketones, ur 15 (A) NEGATIVE mg/dL   Protein, ur 30 (A) NEGATIVE mg/dL   Nitrite NEGATIVE NEGATIVE   Leukocytes, UA LARGE (A) NEGATIVE  Urinalysis, Microscopic (reflex)     Status: Abnormal   Collection Time: 11/17/16  3:41 PM  Result Value Ref Range   RBC / HPF 6-30 0 - 5 RBC/hpf   WBC, UA TOO NUMEROUS TO COUNT 0 - 5 WBC/hpf   Bacteria, UA MANY (A) NONE SEEN   Squamous Epithelial / LPF 0-5 (A) NONE SEEN   Crystals CA OXALATE CRYSTALS (A) NEGATIVE  Blood gas, arterial     Status: Abnormal   Collection Time: 11/17/16  3:57 PM  Result Value Ref Range   FIO2 0.21    pH, Arterial 7.451 (H) 7.350 - 7.450   pCO2 arterial 32.2 32.0 - 48.0 mmHg   pO2, Arterial 77.9 (L) 83.0 - 108.0 mmHg   Bicarbonate 21.9 20.0 - 28.0 mmol/L   Acid-base deficit 1.4 0.0 - 2.0 mmol/L   O2 Saturation 95.4 %   Patient temperature 99.8    Collection site RIGHT RADIAL    Drawn by [id number]    Sample type ARTERIAL DRAW    Allens test (pass/fail) PASS PASS  MRSA PCR Screening     Status: None   Collection Time: 11/17/16  5:20 PM  Result Value Ref Range   MRSA by PCR NEGATIVE NEGATIVE    Comment:        The GeneXpert MRSA Assay (FDA approved for NASAL specimens only), is one component of a comprehensive MRSA colonization surveillance program. It is not intended to diagnose MRSA infection nor to guide or monitor treatment for MRSA infections.   Troponin I (q 6hr x 3)     Status: Abnormal   Collection  Time: 11/17/16  9:26 PM  Result Value Ref Range   Troponin I 0.08 (HH) <0.03 ng/mL    Comment: CRITICAL VALUE NOTED.  VALUE IS  CONSISTENT WITH PREVIOUSLY REPORTED AND CALLED VALUE.  CBC     Status: Abnormal   Collection Time: 11/18/16  3:46 AM  Result Value Ref Range   WBC 14.2 (H) 4.0 - 10.5 K/uL   RBC 5.07 4.22 - 5.81 MIL/uL   Hemoglobin 15.5 13.0 - 17.0 g/dL   HCT 46.8 39.0 - 52.0 %   MCV 92.3 78.0 - 100.0 fL   MCH 30.6 26.0 - 34.0 pg   MCHC 33.1 30.0 - 36.0 g/dL   RDW 14.1 11.5 - 15.5 %   Platelets 94 (L) 150 - 400 K/uL    Comment: CONSISTENT WITH PREVIOUS RESULT  Protime-INR     Status: Abnormal   Collection Time: 11/18/16  3:46 AM  Result Value Ref Range   Prothrombin Time 49.8 (H) 11.4 - 15.2 seconds   INR 5.59 (HH)     Comment: REPEATED TO VERIFY CRITICAL RESULT CALLED TO, READ BACK BY AND VERIFIED WITH: KAretha Parrot 0522 11/18/2016 T. TYSOR   Troponin I (q 6hr x 3)     Status: Abnormal   Collection Time: 11/18/16  3:46 AM  Result Value Ref Range   Troponin I 0.08 (HH) <0.03 ng/mL    Comment: CRITICAL VALUE NOTED.  VALUE IS CONSISTENT WITH PREVIOUSLY REPORTED AND CALLED VALUE.  Basic metabolic panel     Status: Abnormal   Collection Time: 11/18/16  3:46 AM  Result Value Ref Range   Sodium 140 135 - 145 mmol/L   Potassium 3.7 3.5 - 5.1 mmol/L   Chloride 109 101 - 111 mmol/L   CO2 25 22 - 32 mmol/L   Glucose, Bld 144 (H) 65 - 99 mg/dL   BUN 43 (H) 6 - 20 mg/dL   Creatinine, Ser 1.47 (H) 0.61 - 1.24 mg/dL   Calcium 9.0 8.9 - 10.3 mg/dL   GFR calc non Af Amer 44 (L) >60 mL/min   GFR calc Af Amer 51 (L) >60 mL/min    Comment: (NOTE) The eGFR has been calculated using the CKD EPI equation. This calculation has not been validated in all clinical situations. eGFR's persistently <60 mL/min signify possible Chronic Kidney Disease.    Anion gap 6 5 - 15  Blood gas, arterial     Status: Abnormal   Collection Time: 11/18/16  1:30 PM  Result Value Ref Range   FIO2  21.00    pH, Arterial 7.452 (H) 7.350 - 7.450   pCO2 arterial 33.9 32.0 - 48.0 mmHg   pO2, Arterial 76.8 (L) 83.0 - 108.0 mmHg   Bicarbonate 23.3 20.0 - 28.0 mmol/L   Acid-base deficit 0.2 0.0 - 2.0 mmol/L   O2 Saturation 95.9 %   Patient temperature 98.6    Collection site RIGHT RADIAL    Drawn by [id number]    Sample type ARTERIAL DRAW    Allens test (pass/fail) PASS PASS    Current Facility-Administered Medications  Medication Dose Route Frequency Provider Last Rate Last Dose  . 0.9 %  sodium chloride infusion   Intravenous Continuous Enid Derry, [place], DO 100 mL/hr at 11/18/16 0900    . acetaminophen (TYLENOL) tablet 650 mg  650 mg Oral Q6H PRN Shirley, [place], DO      . amLODipine (NORVASC) tablet 10 mg  10 mg Oral Daily Wendee Beavers T, MD   10 mg at 11/18/16 1109  . ceFEPIme (MAXIPIME) 2 g in dextrose 5 % 50 mL IVPB  2 g Intravenous Q24H Karren Cobble,    Stopped at 11/17/16 2218  .  feeding supplement (PRO-STAT SUGAR FREE 64) liquid 30 mL  30 mL Oral TID BM Dickie La, MD   30 mL at 11/18/16 1118  . LORazepam (ATIVAN) injection 0.5 mg  0.5 mg Intravenous Once [name], MD      . metoprolol tartrate (LOPRESSOR) tablet 25 mg  25 mg Oral BID Wendee Beavers T, MD   25 mg at 11/18/16 1109  . multivitamin with minerals tablet 1 tablet  1 tablet Oral Daily Dickie La, MD   1 tablet at 11/18/16 1118  . polyethylene glycol (MIRALAX / GLYCOLAX) packet 17 g  17 g Oral Daily Enid Derry, [place], DO   17 g at 11/18/16 1109  . tamsulosin (FLOMAX) capsule 0.4 mg  0.4 mg Oral Daily Wendee Beavers T, MD   0.4 mg at 11/18/16 1109  . Warfarin - Pharmacist Dosing Inpatient   Does not apply q1800 [facility]   Stopped at 11/17/16 1800    Musculoskeletal: Strength & Muscle Tone: decreased Gait & Station: unable to stand Patient leans: N/A  Psychiatric Specialty Exam: Physical Exam as per history and physical   ROS unable to perform due to current mental status   Blood  pressure 110/73, pulse 60, temperature 98.6 F (37 C), temperature source Axillary, resp. rate (!) 24, height _0  (1.905 m), weight 79.4 kg (175 lb), SpO2 98 %.Body mass index is 21.87 kg/m.  General Appearance: Bizarre, Disheveled and Guarded  Eye Contact:  Minimal  Speech:  Blocked  Volume:  Decreased  Mood:  Depressed  Affect:  Depressed and Inappropriate  Thought Process:  Disorganized and Irrelevant  Orientation:  Other:  Patient is oriented to his first name and date of birth only.   Thought Content:  Illogical, Hallucinations: Visual, Rumination and Tangential  Suicidal Thoughts:  No  Homicidal Thoughts:  No  Memory:  Immediate;   Fair Recent;   Poor Remote;   Poor  Judgement:  Impaired  Insight:  Shallow  Psychomotor Activity:  Decreased  Concentration:  Concentration: Poor and Attention Span: Poor  Recall:  Poor  Fund of Knowledge:  Poor  Language:  Fair  Akathisia:  Negative  Handed:  Right  AIMS (if indicated):     Assets:  Catering manager Housing Leisure Time  ADL's:  Impaired  Cognition:  Impaired,  Moderate  Sleep:        Treatment Plan Summary: 76 years old male with no previous history of mental illness presented with altered mental status with the auditory/visual hallucinations and unable to care for himself. Patient was found talking to himself life neighbors. Patient has no family members who lives with him.   Daily contact with patient to assess and evaluate symptoms and progress in treatment and Medication management   Recommendation: Discontinue risperidone 0.25 mg due to excessive sedation   Continue treatment for UTI sepsis   Will recommend Geodon 10 mg IM Qhs PRN for agitation and aggression only - Do not give more than two shots in 24 hours.  Monitor for Qtc prolongation, as of today, QT/QTc 434/451 ms, which are within normal  Avoid sedative medications like benzos, opioids and psychotropics.  Patient does not meet  criteria for capacity to make his own medical decisions and living arrangements  Unit CSW to contact family members for possible medical care power of attorney and appropriate placement needs  Appreciate psychiatric consultation and follow up as clinically required Please contact [phone] or [phone] if needs further assistance  Disposition: Pending Geriatric psychiatric hospitalization when medically cleared  Recommend psychiatric Inpatient admission when medically cleared. Supportive therapy provided about ongoing stressors.  Ambrose Finland, MD 11/18/2016 2:13 PM

## 2016-11-18 NOTE — Progress Notes (Signed)
Nutrition Follow-up  DOCUMENTATION CODES:   Not applicable  INTERVENTION:   -30 ml Prostat TID, each supplement provides 100 kcals and 15 grams of protein -MVI daily -If pt too lethargic to take PO's, consider placement of temporary NGT (ex cortrak) for enteral nutrition support. Recommend initiate Jevity 1.2 @ 25 ml/hr and increase by 10 ml every 4 hours to goal rate of 55 ml/hr with 30 ml Prostat BID.  Tube feeding regimen provides 2180 kcal (100% of needs), 114 grams of protein, and 1003 ml of H2O.   NUTRITION DIAGNOSIS:   Inadequate oral intake related to lethargy/confusion as evidenced by meal completion < 50%.  Ongoing  GOAL:   Patient will meet greater than or equal to 90% of their needs  Progressing  MONITOR:   PO intake, Labs, Weight trends, Skin, I & O's  REASON FOR ASSESSMENT:   Low Braden    ASSESSMENT:   Juan Wilkerson is a 76 y.o. male presenting with altered mental status. PMH is significant for atrial flutter/fibrillation on warfarin, hypertension, hyperlipidemia, ?Marfan's syndrome, Aortic valve replacement, microscopic hematuria, left renal mass  Pt transferred to SDU related to altered mental status.   Pt was minimally responsive at time of visit; he greeted this RD and answered "okay" to about 30% of questioned asked. Spoke with nurse tech, who reports pt has a meal tray, but has not eaten yet due to mental status.   Nutrition-Focused physical exam completed. Findings are mild to moderate fat depletion, mild to moderate muscle depletion, and no edema.   Per psychiatry note, recommending geri-psych placement.   Labs reviewed.   Diet Order:  Diet Heart Room service appropriate? Yes; Fluid consistency: Thin  Skin:  Wound (see comment) (DPTI rt heel)  Last BM:  11/17/16  Height:   Ht Readings from Last 1 Encounters:  11/11/16 6\' 3"  (1.905 m)    Weight:   Wt Readings from Last 1 Encounters:  11/11/16 175 lb (79.4 kg)    Ideal Body Weight:   89.1 kg  BMI:  Body mass index is 21.87 kg/m.  Estimated Nutritional Needs:   Kcal:  4098-1191  Protein:  110-125 grams  Fluid:  2.3-2.5 L  EDUCATION NEEDS:   Education needs no appropriate at this time  Seline Enzor A. Jimmye Norman, RD, LDN, CDE Pager: [phone] After hours Pager: [phone]

## 2016-11-18 NOTE — Progress Notes (Signed)
Patient HR dropping into the 30's with ongoing pauses.  BP decreased 92/65, patient is responding minimally to painful stimuli.  Contacted MD, updated on patients condition.  Received orders for 12 LED EKG, BMET, ABG, and Troponin.  EKG in progress at this time.  Will continue to monitor.

## 2016-11-18 NOTE — Progress Notes (Signed)
Family Medicine Teaching Service Daily Progress Note Intern Pager: [phone]  Patient name: Juan Wilkerson Medical record number: [id number] Date of birth: 1940/08/11 Age: 76 y.o. Gender: male  Primary Care Provider: Lovenia Kim, MD Consultants: Psychiatry Code Status: FULL CODE  Pt Overview and Major Events to Date:  Juan Wilkerson is a 76 y.o. male presenting with altered mental status. PMH is significant for atrial flutter/fibrillation on warfarin, hypertension, hyperlipidemia, Marfan's syndrome, Aortic valve replacement, microscopic hematuria, left renal mass 9/6- Psychiatric assessment recommended inpatient placement ([facility])  Assessment and Plan:  UTI meeting septsis criteria: Patient declined yesterday and developed fever of 100.6 with increased respirations. Afebrile for over 12 hours now with normal vitals. Patient appears much better today and can answer questions and participate in exam. WBC count increased from 8.3> 14.2 on 09/12. Work up for sepsis including blood and urine culture which are both pending. Serial troponins were 0.07, 0.08, 0.08. CXR on 09/11 for workup showed: Subtle increased density in the left suprahilar region with no recent CXR to compare, findings could reflect developing upper lobe pneumonia. - 100 cc/hr NS for 12 hours - Vanc (9/11-9/12) and Cefepime (9/11-), MRSA PCR negative- d/c vanc  Altered mental status: He has tolerated partial restraint removal without significant behavioral problems- not requiring them currently. Tangential thought content during conversation. 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 -Soft restraints, mittens, if agitated -Avoid haldol (anti-dopaminergic in setting of suspected LBD) -Inpatient psychiatric placement process started - will obtain SLP eval for swallowing - no longer in mittens  Anticoagulation for Mechanical aortic valve: Supra-therapeutic INR 2.02> 5.59 on 09/12, range for a aortic mechanical valve  with A-fib is 2.5-3.5.  -Coumadin 5mg  PM- held last night and  to be held today, may consider using vitamin K if number continues to increase -Repeat INR in AM - pharmacy assistance for monitoring and titration of anticoagulation meds is appreciated  AKI: Patient increase in Cr from 0.98 > 1.47 on 09/12. Likely pre-renal from dehydration. - Monitor, giving IVF 100 cc/hr for 12 hours - Bladder scan with >999 mL shows that patient not voiding adequately, insert foley to drain  Constipation: Patient tender to palpation on exam in lower abdomen. KUB on 09/11 showed Increased colonic stool burden and increased gas. This may reflect constipation in the appropriate clinical setting. There is a moderate amount of stool within the rectum which could reflect a fecal impaction though the degree of rectal distention is not great.  - miralax prescribed - had large bm after enema yesterday  Atrial flutter/fibrillation without RVR: Rate controlled on metoprolol.  - Anticoagulation as above. - Metoprolol 25 mg BID  Stage 1 sacral ulcer: Staff aware  - Monitor and continue to rotate in bed to relieve pressure of area  Mild L shoulder/ trapezius pain: Reproducible on exam, hard to obtain more information. Patient points to his trapezius and says it only hurts a little.  - tylenol for pain, troponins neg x3, and INR supra-therapeutic so less likely PE - Monitor - Noted stable cyst that 1 in by 1 in on mid left back, mobile and most likely benign  FEN/GI: Heart healthy diet VTE Prophylaxis: Coumadin  Disposition: Inpatient psychiatric admission  Subjective:  Juan Wilkerson is more active this morning. Oriented to person. More talkative.  Objective: Temp:  [97.5 F (36.4 C)-100.6 F (38.1 C)] 97.5 F (36.4 C) (09/12 0700) Pulse Rate:  [67-103] 67 (09/12 0700) Resp:  [17-23] 21 (09/12 0700) BP: (91-126)/(52-88) 122/88 (09/12 0700)  SpO2:  [90 %-98 %] 97 % (09/12 0700) Physical Exam: General: NAD,  laying comfortably, awake and alert HEENT: dry mucous membranes Cardiovascular: Irregular rhythm, normal rate, loud S2 Respiratory: CTAB, normal WOB Skin: 1 in by 1 in mobile cyst on left mid back no erythema noted Extremities: stage 1 pressure ulcer on sacrum Neuro: Alert and oriented to person, confused Psych: Answers demonstrate tangential thought contents or word searching   Laboratory:  Recent Labs Lab 11/17/16 0731 11/17/16 1524 11/18/16 0346  WBC 14.8* 13.7* 14.2*  HGB 15.3 15.7 15.5  HCT 45.7 47.1 46.8  PLT 113* 89* 94*    Recent Labs Lab 11/11/16 1430 11/17/16 0731 11/17/16 1524 11/18/16 0346  NA 140 138 138 140  K 3.8 3.7 3.7 3.7  CL 106 107 107 109  CO2 23 22 21* 25  BUN 20 36* 40* 43*  CREATININE 0.98 1.36* 1.51* 1.47*  CALCIUM 9.2 9.2 8.8* 9.0  PROT 6.7  --  6.1*  --   BILITOT 1.4*  --  1.3*  --   ALKPHOS 83  --  91  --   ALT 13*  --  19  --   AST 21  --  23  --   GLUCOSE 107* 147* 132* 144*   INR: 4.49  Dg Abd 1 View  Result Date: 11/17/2016 CLINICAL DATA:  Abdominal distension EXAM: ABDOMEN - 1 VIEW COMPARISON:  Abdominal and pelvic CT scan of March 06, 2016 FINDINGS: There is moderately increased gas within the colon with a moderately increased stool burden. There is also increased stool in the rectum. There is no small bowel obstructive pattern. There is no evidence of perforation. There are degenerative changes of the lumbar spine. IMPRESSION: Increased colonic stool burden and increased gas. This may reflect constipation in the appropriate clinical setting. There is a moderate amount of stool within the rectum which could reflect a fecal impaction though the degree of rectal distention is not great. Electronically Signed   By: David  [place] M.D.   On: 11/17/2016 15:39   Dg Chest Port 1 View  Result Date: 11/17/2016 CLINICAL DATA:  Altered mental status EXAM: PORTABLE CHEST 1 VIEW COMPARISON:  CT scan of the chest dated March 06, 2016  FINDINGS: The lungs are adequately inflated. There is new increased density in the left upper lobe. There is no pleural effusion. The heart is top-normal in size. The pulmonary vascularity is not engorged. The sternal wires are intact. There is calcification in the wall of the aortic arch. The observed bony thorax is unremarkable. IMPRESSION: Subtle increased density in the left suprahilar region. I have no recent chest x-ray with which to compare. The findings could reflect developing upper lobe pneumonia. No CHF nor other acute cardiac abnormality. Thoracic aortic atherosclerosis. Electronically Signed   By: David  [place] M.D.   On: 11/17/2016 15:41   MRI 09/10 showed No acute intracranial abnormality. Small remote right posterior cortical and left cerebellar Infarcts. Mild generalized cerebral atrophy with nonspecific cerebral white matter changes, mildly progressed relative to most recent MRI from 2015.  Reford Olliff, [place], DO 11/18/2016, 8:01 AM PGY-1, [facility] Intern pager: [phone], text pages welcome

## 2016-11-19 DIAGNOSIS — R140 Abdominal distension (gaseous): Secondary | ICD-10-CM

## 2016-11-19 DIAGNOSIS — N390 Urinary tract infection, site not specified: Secondary | ICD-10-CM

## 2016-11-19 LAB — CBC
HCT: 42.3 % (ref 39.0–52.0)
Hemoglobin: 13.8 g/dL (ref 13.0–17.0)
MCH: 30.1 pg (ref 26.0–34.0)
MCHC: 32.6 g/dL (ref 30.0–36.0)
MCV: 92.2 fL (ref 78.0–100.0)
Platelets: 107 10*3/uL — ABNORMAL LOW (ref 150–400)
RBC: 4.59 MIL/uL (ref 4.22–5.81)
RDW: 14 % (ref 11.5–15.5)
WBC: 9.3 10*3/uL (ref 4.0–10.5)

## 2016-11-19 LAB — BASIC METABOLIC PANEL
Anion gap: 7 (ref 5–15)
BUN: 40 mg/dL — ABNORMAL HIGH (ref 6–20)
CALCIUM: 8.7 mg/dL — AB (ref 8.9–10.3)
CO2: 23 mmol/L (ref 22–32)
CREATININE: 0.96 mg/dL (ref 0.61–1.24)
Chloride: 112 mmol/L — ABNORMAL HIGH (ref 101–111)
GFR calc non Af Amer: >60 mL/min (ref >60)
GLUCOSE: 111 mg/dL — AB (ref 65–99)
Potassium: 3.2 mmol/L — ABNORMAL LOW (ref 3.5–5.1)
Sodium: 142 mmol/L (ref 135–145)

## 2016-11-19 LAB — GLUCOSE, CAPILLARY: Glucose-Capillary: 111 mg/dL — ABNORMAL HIGH (ref 65–99)

## 2016-11-19 LAB — PROTIME-INR
INR: 5.47
PROTHROMBIN TIME: 49.4 s — AB (ref 11.4–15.2)

## 2016-11-19 MED ORDER — POTASSIUM CHLORIDE CRYS ER 20 MEQ PO TBCR
40.0000 meq | EXTENDED_RELEASE_TABLET | Freq: Once | ORAL | Status: AC
  Administered 2016-11-19: 40 meq via ORAL
  Filled 2016-11-19: qty 2

## 2016-11-19 MED ORDER — PHYTONADIONE 5 MG PO TABS
2.5000 mg | ORAL_TABLET | Freq: Once | ORAL | Status: AC
  Administered 2016-11-19: 2.5 mg via ORAL
  Filled 2016-11-19: qty 1

## 2016-11-19 MED ORDER — METOPROLOL TARTRATE 12.5 MG HALF TABLET
12.5000 mg | ORAL_TABLET | Freq: Two times a day (BID) | ORAL | Status: DC
  Administered 2016-11-19 – 2016-11-30 (×20): 12.5 mg via ORAL
  Filled 2016-11-19 (×24): qty 1

## 2016-11-19 NOTE — Progress Notes (Signed)
CRITICAL VALUE ALERT  Critical Value:  INR 5.47  Date & Time Notied:  11/19/16   5176  Provider Notified: FMTS  Orders Received/Actions taken: Will hold AM coumadin

## 2016-11-19 NOTE — Consult Note (Signed)
[facility] Consult   Reason for Consult:  AMS and hallucinations Referring Physician:  Dr. Nori Riis Patient Identification: Juan Wilkerson MRN:  [id number] Principal Diagnosis: Altered mental status Diagnosis:   Patient Active Problem List   Diagnosis Date Noted  . Abdominal distention [R14.0]   . Urinary tract infection without hematuria [N39.0]   . Malnutrition of moderate degree [E44.0] 11/18/2016  . S/P AVR (aortic valve replacement) [Z95.2]   . Chronic anticoagulation [Z79.01]   . 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 Hallucinations [R44.3]   . Pressure injury of skin [L89.90] 11/12/2016  . Altered mental status [R41.82] 11/11/2016  . Constipation [K59.00] 09/12/2015  . Upper airway cough syndrome [R05] 04/16/2015  . Left renal mass [N28.89]   . 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 Traumatic ecchymosis of multiple sites [T14.8XXA] 12/03/2014  . Easy bruising [R23.8] 12/03/2014  . Arthritis of knee, left [M17.12] 12/15/2013  . Colon cancer screening [Z12.11] 08/28/2013  . Word finding difficulty [R47.89] 08/28/2013  . Microscopic hematuria [R31.29] 11/29/2011  . Long term (current) use of anticoagulants [Z79.01] 04/19/2010  . AORTIC VALVE REPLACEMENT, HX OF [Z95.4] 01/24/2010  . ATRIAL FLUTTER [I48.92] 07/15/2009  . ESSENTIAL HYPERTENSION, BENIGN [I10] 04/29/2009  . Atrial fibrillation ([place]) [I48.91] 04/29/2009  . Aneurysm of thoracic aorta ([place]) [I71.2] 04/29/2009  . Degenerative arthritis of knee, bilateral [M17.0] 04/10/2009  . 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 HYPERCHOLESTEROLEMIA [E78.00] 07/22/2006  . DEGENERATIVE JOINT DISEASE, BOTH KNEES, SEVERE [M17.10] 07/22/2006    Total Time spent with patient: 1 hour  Subjective:   Juan Wilkerson is a 76 y.o. male patient admitted with AMS.  HPI: Juan Wilkerson is a 76 years old male admitted to the [facility] with altered mental status and both auditory and visual  hallucinations observed by the neighbors. Reportedly has been talking to himself and could not make meaningful conversation. Patient started talking about seeing shadows and people were multiple doing different things. Patient reported that investigating. Patient is overall poor historian. Patient was seen today because of recent diagnosis of urinary tract infections and his mentation has been in and out. Staff RN reported at 11:00 this was able to respond verbally even though not meaningfully and then finds him couple of hours later could not wake him up and required stent. It is not clear his antipsychotic medication risperidone 0.25 mg twice daily started on June 6 has any impact on his mental status , which was currently discontinued. Patient has no family members in contact with him. He has one brother who has been in contact with the social service from [place]. Believe his recent change in mental status probably secondary to Urinary tract infection.   November 19, 2016 Interval history: Patient seen today for psychiatric consultation follow-up. Patient appeared awake and confused. Patient has been talkative but mostly noncompatible speech and extremely tangential. Patient denies irritability, agitation and aggressive behavior. Patient reported he was excited and happy that he is going to work with the staff members in the hospital and could not name his job responsibilities. 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 Based on my observation and evaluation patient seems to be suffering with delirium secondary to urinary tract infection and hoping treatment for infection will clear up some of these confusion. Continue to recommend keep him away from sedative medications and benzodiazepines and psychotropics if possible. Patient may need Geodon intramuscular as needed for  agitation and aggressive behavior only.   Risk to Self: Is patient at risk for suicide?: No Risk  to Others:   Prior Inpatient Therapy:   Prior Outpatient Therapy:    Past Medical History:  Past Medical History:  Diagnosis Date  . Aortic aneurysm ([place]) 1994   From presumed Marfan's Syndrome.  Followed by Dr Stanford Breed.  Aneurysm involving the proximal descending thoracic aorta .    Marland Kitchen Atrial fibrillation ([place])    Followed by Dr Stanford Breed.  On coumadin.  . Atrial flutter ([place])    Followed by Dr Stanford Breed  . History of blood transfusion   . Hyperlipidemia   . Hypertension   . Marfan's syndrome with aortic dilation   . Osteoarthritis   . Pulmonary nodules    Found on chest CT 08/2007.  Monitoring with yearly CT.  3.72m nodule in R iddle lobe.    Past Surgical History:  Procedure Laterality Date  . AORTIC VALVE REPLACEMENT  03/1993   St Jude Valve  . CARDIAC VALVE REPLACEMENT    . CORONARY ARTERY BYPASS GRAFT    . HIP FRACTURE SURGERY  03/1992   s/p R hip Fx  . IR GENERIC HISTORICAL  04/01/2016   IR RADIOLOGIST EVAL & MGMT 04/01/2016 GAletta Edouard MD [facility] INTERV RAD  . KIDNEY SURGERY    . VASCULAR SURGERY    . Virtual Colonoscopy  11/01/2005   Pt on chronic coumadin for Aortic valve replacement and Atrial fibrilliation therefore at high risk if stopped.  Need to repeat every 5 yrs.    Family History:  Family History  Problem Relation Age of Onset  . Cancer Brother   . Hypertension Brother   . Aortic aneurysm Maternal Aunt    Family Psychiatric  History: Unknown Social History:  History  Alcohol Use No     History  Drug Use No    Social History   Social History  . Marital status: Divorced    Spouse name: N/A  . Number of children: 0  . Years of education: 131  Occupational History  . [place]  . 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 Smoking status: Never Smoker  . Smokeless tobacco: Never Used  . Alcohol use No  . Drug use: No  . Sexual activity: No   Other Topics Concern  . None   Social History Narrative   Divorced,  lives alone. Has a daughter (pt does not have contact with her, has not seen her since she was 89.    Studied music in college.   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   No smoking. No alcohol use. No recreational drugs.      Health Care POA:    Emergency Contact: MGerald [phone]  End of Life Plan: Gave pt the ad pamplet   Who lives with you: Lives by himself   Any pets: 0   Diet: pt diet varies   Exercise: walks and going up and down stairs most days   Seatbelts: pt uses seat belt when in his car   Sun Exposure/Protection: pt uses hats and long sleeves   Hobbies: harmonica                  Additional Social History:    Allergies:   Allergies  Allergen Reactions  . Lemon Oil Other (See Comments)  Only skin contact dermatitis    Labs:  Results for orders placed or performed during the hospital encounter of 11/11/16 (from the past 48 hour(s))  Culture, blood (Routine X 2) w Reflex to ID Panel     Status: None (Preliminary result)   Collection Time: 11/17/16  3:24 PM  Result Value Ref Range   Specimen Description BLOOD LEFT HAND    Special Requests Blood Culture adequate volume    Culture NO GROWTH 2 DAYS    Report Status PENDING   Culture, blood (Routine X 2) w Reflex to ID Panel     Status: None (Preliminary result)   Collection Time: 11/17/16  3:24 PM  Result Value Ref Range   Specimen Description BLOOD RIGHT HAND    Special Requests Blood Culture adequate volume    Culture NO GROWTH 2 DAYS    Report Status PENDING   Troponin I     Status: Abnormal   Collection Time: 11/17/16  3:24 PM  Result Value Ref Range   Troponin I 0.07 (HH) <0.03 ng/mL    Comment: CRITICAL RESULT CALLED TO, READ BACK BY AND VERIFIED WITH: K.BUCKNUM,RN _0  09.11.18 SPIKESN   Ammonia     Status: None   Collection Time: 11/17/16  3:24 PM  Result Value Ref Range   Ammonia 30 9 - 35 umol/L  CBC      Status: Abnormal   Collection Time: 11/17/16  3:24 PM  Result Value Ref Range   WBC 13.7 (H) 4.0 - 10.5 K/uL   RBC 5.16 4.22 - 5.81 MIL/uL   Hemoglobin 15.7 13.0 - 17.0 g/dL   HCT 47.1 39.0 - 52.0 %   MCV 91.3 78.0 - 100.0 fL   MCH 30.4 26.0 - 34.0 pg   MCHC 33.3 30.0 - 36.0 g/dL   RDW 14.0 11.5 - 15.5 %   Platelets 89 (L) 150 - 400 K/uL    Comment: SPECIMEN CHECKED FOR CLOTS REPEATED TO VERIFY CONSISTENT WITH PREVIOUS RESULT   Comprehensive metabolic panel     Status: Abnormal   Collection Time: 11/17/16  3:24 PM  Result Value Ref Range   Sodium 138 135 - 145 mmol/L   Potassium 3.7 3.5 - 5.1 mmol/L   Chloride 107 101 - 111 mmol/L   CO2 21 (L) 22 - 32 mmol/L   Glucose, Bld 132 (H) 65 - 99 mg/dL   BUN 40 (H) 6 - 20 mg/dL   Creatinine, Ser 1.51 (H) 0.61 - 1.24 mg/dL   Calcium 8.8 (L) 8.9 - 10.3 mg/dL   Total Protein 6.1 (L) 6.5 - 8.1 g/dL   Albumin 3.1 (L) 3.5 - 5.0 g/dL   AST 23 15 - 41 U/L   ALT 19 17 - 63 U/L   Alkaline Phosphatase 91 38 - 126 U/L   Total Bilirubin 1.3 (H) 0.3 - 1.2 mg/dL   GFR calc non Af Amer 43 (L) >60 mL/min   GFR calc Af Amer 50 (L) >60 mL/min    Comment: (NOTE) The eGFR has been calculated using the CKD EPI equation. This calculation has not been validated in all clinical situations. eGFR's persistently <60 mL/min signify possible Chronic Kidney Disease.    Anion gap 10 5 - 15  Urinalysis, Routine w reflex microscopic     Status: Abnormal   Collection Time: 11/17/16  3:41 PM  Result Value Ref Range   Color, Urine ORANGE (A) YELLOW    Comment: BIOCHEMICALS MAY BE AFFECTED BY COLOR   APPearance TURBID (  A) CLEAR   Specific Gravity, Urine 1.020 1.005 - 1.030   pH 6.5 5.0 - 8.0   Glucose, UA NEGATIVE NEGATIVE mg/dL   Hgb urine dipstick LARGE (A) NEGATIVE   Bilirubin Urine NEGATIVE NEGATIVE   Ketones, ur 15 (A) NEGATIVE mg/dL   Protein, ur 30 (A) NEGATIVE mg/dL   Nitrite NEGATIVE NEGATIVE   Leukocytes, UA LARGE (A) NEGATIVE  Urinalysis,  Microscopic (reflex)     Status: Abnormal   Collection Time: 11/17/16  3:41 PM  Result Value Ref Range   RBC / HPF 6-30 0 - 5 RBC/hpf   WBC, UA TOO NUMEROUS TO COUNT 0 - 5 WBC/hpf   Bacteria, UA MANY (A) NONE SEEN   Squamous Epithelial / LPF 0-5 (A) NONE SEEN   Crystals CA OXALATE CRYSTALS (A) NEGATIVE  Culture, Urine     Status: Abnormal (Preliminary result)   Collection Time: 11/17/16  3:43 PM  Result Value Ref Range   Specimen Description URINE, CATHETERIZED    Special Requests NONE    Culture (A)     >=100,000 COLONIES/mL GRAM NEGATIVE RODS IDENTIFICATION AND SUSCEPTIBILITIES TO FOLLOW    Report Status PENDING   Blood gas, arterial     Status: Abnormal   Collection Time: 11/17/16  3:57 PM  Result Value Ref Range   FIO2 0.21    pH, Arterial 7.451 (H) 7.350 - 7.450   pCO2 arterial 32.2 32.0 - 48.0 mmHg   pO2, Arterial 77.9 (L) 83.0 - 108.0 mmHg   Bicarbonate 21.9 20.0 - 28.0 mmol/L   Acid-base deficit 1.4 0.0 - 2.0 mmol/L   O2 Saturation 95.4 %   Patient temperature 99.8    Collection site RIGHT RADIAL    Drawn by [id number]    Sample type ARTERIAL DRAW    Allens test (pass/fail) PASS PASS  MRSA PCR Screening     Status: None   Collection Time: 11/17/16  5:20 PM  Result Value Ref Range   MRSA by PCR NEGATIVE NEGATIVE    Comment:        The GeneXpert MRSA Assay (FDA approved for NASAL specimens only), is one component of a comprehensive MRSA colonization surveillance program. It is not intended to diagnose MRSA infection nor to guide or monitor treatment for MRSA infections.   Troponin I (q 6hr x 3)     Status: Abnormal   Collection Time: 11/17/16  9:26 PM  Result Value Ref Range   Troponin I 0.08 (HH) <0.03 ng/mL    Comment: CRITICAL VALUE NOTED.  VALUE IS CONSISTENT WITH PREVIOUSLY REPORTED AND CALLED VALUE.  CBC     Status: Abnormal   Collection Time: 11/18/16  3:46 AM  Result Value Ref Range   WBC 14.2 (H) 4.0 - 10.5 K/uL   RBC 5.07 4.22 - 5.81 MIL/uL    Hemoglobin 15.5 13.0 - 17.0 g/dL   HCT 46.8 39.0 - 52.0 %   MCV 92.3 78.0 - 100.0 fL   MCH 30.6 26.0 - 34.0 pg   MCHC 33.1 30.0 - 36.0 g/dL   RDW 14.1 11.5 - 15.5 %   Platelets 94 (L) 150 - 400 K/uL    Comment: CONSISTENT WITH PREVIOUS RESULT  Protime-INR     Status: Abnormal   Collection Time: 11/18/16  3:46 AM  Result Value Ref Range   Prothrombin Time 49.8 (H) 11.4 - 15.2 seconds   INR 5.59 (HH)     Comment: REPEATED TO VERIFY CRITICAL RESULT CALLED TO, READ BACK BY AND VERIFIED  WITH: Normajean Baxter 0522 11/18/2016 T. TYSOR   Troponin I (q 6hr x 3)     Status: Abnormal   Collection Time: 11/18/16  3:46 AM  Result Value Ref Range   Troponin I 0.08 (HH) <0.03 ng/mL    Comment: CRITICAL VALUE NOTED.  VALUE IS CONSISTENT WITH PREVIOUSLY REPORTED AND CALLED VALUE.  Basic metabolic panel     Status: Abnormal   Collection Time: 11/18/16  3:46 AM  Result Value Ref Range   Sodium 140 135 - 145 mmol/L   Potassium 3.7 3.5 - 5.1 mmol/L   Chloride 109 101 - 111 mmol/L   CO2 25 22 - 32 mmol/L   Glucose, Bld 144 (H) 65 - 99 mg/dL   BUN 43 (H) 6 - 20 mg/dL   Creatinine, Ser 1.47 (H) 0.61 - 1.24 mg/dL   Calcium 9.0 8.9 - 10.3 mg/dL   GFR calc non Af Amer 44 (L) >60 mL/min   GFR calc Af Amer 51 (L) >60 mL/min    Comment: (NOTE) The eGFR has been calculated using the CKD EPI equation. This calculation has not been validated in all clinical situations. eGFR's persistently <60 mL/min signify possible Chronic Kidney Disease.    Anion gap 6 5 - 15  Blood gas, arterial     Status: Abnormal   Collection Time: 11/18/16  1:30 PM  Result Value Ref Range   FIO2 21.00    pH, Arterial 7.452 (H) 7.350 - 7.450   pCO2 arterial 33.9 32.0 - 48.0 mmHg   pO2, Arterial 76.8 (L) 83.0 - 108.0 mmHg   Bicarbonate 23.3 20.0 - 28.0 mmol/L   Acid-base deficit 0.2 0.0 - 2.0 mmol/L   O2 Saturation 95.9 %   Patient temperature 98.6    Collection site RIGHT RADIAL    Drawn by [phone]    Sample type ARTERIAL  DRAW    Allens test (pass/fail) PASS PASS  Basic metabolic panel     Status: Abnormal   Collection Time: 11/18/16  1:38 PM  Result Value Ref Range   Sodium 139 135 - 145 mmol/L   Potassium 3.5 3.5 - 5.1 mmol/L   Chloride 112 (H) 101 - 111 mmol/L   CO2 21 (L) 22 - 32 mmol/L   Glucose, Bld 129 (H) 65 - 99 mg/dL   BUN 49 (H) 6 - 20 mg/dL   Creatinine, Ser 1.23 0.61 - 1.24 mg/dL   Calcium 8.9 8.9 - 10.3 mg/dL   GFR calc non Af Amer 55 (L) >60 mL/min   GFR calc Af Amer >60 >60 mL/min    Comment: (NOTE) The eGFR has been calculated using the CKD EPI equation. This calculation has not been validated in all clinical situations. eGFR's persistently <60 mL/min signify possible Chronic Kidney Disease.    Anion gap 6 5 - 15  Troponin I (q 6hr x 3)     Status: Abnormal   Collection Time: 11/18/16  1:38 PM  Result Value Ref Range   Troponin I 0.08 (HH) <0.03 ng/mL    Comment: CRITICAL VALUE NOTED.  VALUE IS CONSISTENT WITH PREVIOUSLY REPORTED AND CALLED VALUE.  Magnesium     Status: None   Collection Time: 11/18/16  1:38 PM  Result Value Ref Range   Magnesium 2.3 1.7 - 2.4 mg/dL  CBC     Status: Abnormal   Collection Time: 11/19/16  5:35 AM  Result Value Ref Range   WBC 9.3 4.0 - 10.5 K/uL   RBC 4.59 4.22 - 5.81  MIL/uL   Hemoglobin 13.8 13.0 - 17.0 g/dL   HCT 42.3 39.0 - 52.0 %   MCV 92.2 78.0 - 100.0 fL   MCH 30.1 26.0 - 34.0 pg   MCHC 32.6 30.0 - 36.0 g/dL   RDW 14.0 11.5 - 15.5 %   Platelets 107 (L) 150 - 400 K/uL    Comment: REPEATED TO VERIFY CONSISTENT WITH PREVIOUS RESULT   Protime-INR     Status: Abnormal   Collection Time: 11/19/16  5:35 AM  Result Value Ref Range   Prothrombin Time 49.4 (H) 11.4 - 15.2 seconds   INR 5.47 (HH)     Comment: REPEATED TO VERIFY CRITICAL RESULT CALLED TO, READ BACK BY AND VERIFIED WITH: Mariann Laster [id number] [facility]   Basic metabolic panel     Status: Abnormal   Collection Time: 11/19/16  5:35 AM  Result Value Ref Range   Sodium 142  135 - 145 mmol/L   Potassium 3.2 (L) 3.5 - 5.1 mmol/L   Chloride 112 (H) 101 - 111 mmol/L   CO2 23 22 - 32 mmol/L   Glucose, Bld 111 (H) 65 - 99 mg/dL   BUN 40 (H) 6 - 20 mg/dL   Creatinine, Ser 0.96 0.61 - 1.24 mg/dL   Calcium 8.7 (L) 8.9 - 10.3 mg/dL   GFR calc non Af Amer >60 >60 mL/min   GFR calc Af Amer >60 >60 mL/min    Comment: (NOTE) The eGFR has been calculated using the CKD EPI equation. This calculation has not been validated in all clinical situations. eGFR's persistently <60 mL/min signify possible Chronic Kidney Disease.    Anion gap 7 5 - 15    Current Facility-Administered Medications  Medication Dose Route Frequency Provider Last Rate Last Dose  . acetaminophen (TYLENOL) tablet 650 mg  650 mg Oral Q6H PRN Shirley, [place], DO      . amLODipine (NORVASC) tablet 10 mg  10 mg Oral Daily Wendee Beavers T, MD   10 mg at 11/19/16 1018  . ceFEPIme (MAXIPIME) 2 g in dextrose 5 % 50 mL IVPB  2 g Intravenous Q24H Karren Cobble, RPH 100 mL/hr at 11/18/16 1805 2 g at 11/18/16 1805  . feeding supplement (PRO-STAT SUGAR FREE 64) liquid 30 mL  30 mL Oral TID BM Dickie La, MD   30 mL at 11/19/16 1315  . LORazepam (ATIVAN) injection 0.5 mg  0.5 mg Intravenous Once [name], MD      . MEDLINE mouth rinse  15 mL Mouth Rinse BID Dickie La, MD   15 mL at 11/19/16 1000  . metoprolol tartrate (LOPRESSOR) tablet 12.5 mg  12.5 mg Oral BID Shirley, [place], DO   12.5 mg at 11/19/16 1017  . multivitamin with minerals tablet 1 tablet  1 tablet Oral Daily Dickie La, MD   1 tablet at 11/19/16 1017  . polyethylene glycol (MIRALAX / GLYCOLAX) packet 17 g  17 g Oral Daily Enid Derry, [place], DO   17 g at 11/19/16 1018  . tamsulosin (FLOMAX) capsule 0.4 mg  0.4 mg Oral Daily Wendee Beavers T, MD   0.4 mg at 11/19/16 1018  . Warfarin - Pharmacist Dosing Inpatient   Does not apply q[address] [facility]   Stopped at 11/17/16 1800    Musculoskeletal: Strength & Muscle Tone:  decreased Gait & Station: unable to stand Patient leans: N/A  Psychiatric Specialty Exam: Physical Exam as per history and physical   ROS unable to  perform due to current mental status   Blood pressure 94/80, pulse (!) 31, temperature 98 F (36.7 C), temperature source Axillary, resp. rate 20, height _0  (1.905 m), weight 79.4 kg (175 lb), SpO2 96 %.Body mass index is 21.87 kg/m.  General Appearance: Bizarre, Disheveled and Guarded  Eye Contact:  Minimal  Speech:  Blocked  Volume:  Decreased  Mood:  Depressed  Affect:  Depressed and Inappropriate  Thought Process:  Disorganized and Irrelevant  Orientation:  Other:  Patient is oriented to his first name and date of birth only.   Thought Content:  Illogical, Hallucinations: Visual, Rumination and Tangential  Suicidal Thoughts:  No  Homicidal Thoughts:  No  Memory:  Immediate;   Fair Recent;   Poor Remote;   Poor  Judgement:  Impaired  Insight:  Shallow  Psychomotor Activity:  Decreased  Concentration:  Concentration: Poor and Attention Span: Poor  Recall:  Poor  Fund of Knowledge:  Poor  Language:  Fair  Akathisia:  Negative  Handed:  Right  AIMS (if indicated):     Assets:  Catering manager Housing Leisure Time  ADL's:  Impaired  Cognition:  Impaired,  Moderate  Sleep:        Treatment Plan Summary: 76 years old male with no previous history of mental illness presented with altered mental status with the auditory/visual hallucinations and unable to care for himself. Patient was found talking to himself life neighbors. Patient has no family members who lives with him.  Patient continued to be confused, speech is tangential and difficult to comprehend because he is speaking out of context. Patient continued to have altered mental status with the few hallucinations during this evaluation.  Daily contact with patient to assess and evaluate symptoms and progress in treatment and Medication management    Recommendation: Discontinue risperidone 0.25 mg due to excessive sedation   Continue treatment for UTI sepsis   Recommend Geodon 10 mg IM Qhs PRN for agitation and aggression only - Do not give more than two shots in 24 hours.  Monitor for Qtc prolongation, as of today, QT/QTc 434/451 ms, which are within normal  Avoid sedative medications like benzos, opioids and psychotropics.  Patient does not meet criteria for capacity to make his own medical decisions and living arrangements  Unit CSW to contact family members for possible medical care power of attorney and appropriate placement needs  Appreciate psychiatric consultation and follow up as clinically required Please contact [phone] or [phone] if needs further assistance   Disposition: Pending Geriatric psychiatric hospitalization when medically cleared Supportive therapy provided about ongoing stressors.  Ambrose Finland, MD 11/19/2016 1:17 PM

## 2016-11-19 NOTE — Progress Notes (Addendum)
ANTICOAGULATION CONSULT NOTE - Follow Up Consult  Pharmacy Consult for Warfarin Indication: atrial fibrillation and mechanical AVR   Patient Measurements: Height: 6\' 3"  (190.5 cm) Weight: 175 lb (79.4 kg) IBW/kg (Calculated) : 84.5 Heparin Dosing Weight: 79.4 kg  Vital Signs: Temp: 98.7 F (37.1 C) (09/13 0700) Temp Source: Oral (09/13 0700) BP: 125/76 (09/13 0700) Pulse Rate: 31 (09/13 0700)  Labs:  Recent Labs  11/17/16 0731  11/17/16 1524 11/17/16 2126 11/18/16 0346 11/18/16 1338 11/19/16 0535  HGB 15.3  --  15.7  --  15.5  --  13.8  HCT 45.7  --  47.1  --  46.8  --  42.3  PLT 113*  --  89*  --  94*  --  107*  LABPROT 42.2*  --   --   --  49.8*  --  49.4*  INR 4.49*  --   --   --  5.59*  --  5.47*  HEPARINUNFRC 0.32  --   --   --   --   --   --   CREATININE 1.36*  --  1.51*  --  1.47* 1.23 0.96  TROPONINI  --   < > 0.07* 0.08* 0.08* 0.08*  --   < > = values in this interval not displayed.  Estimated Creatinine Clearance: 72.4 mL/min (by C-G formula based on SCr of 0.96 mg/dL).   Assessment: 76 yo M on warfarin pta for mechanical AVR and afib, admitted with subtherapeutic INR, likely from noncompliance with medications due to AMS.    He was started on a heparin bridge. We've been giving him higher 5mg  doses (for 5 days). INR doubled to 4.49 and heparin was stopped. INR remains stable at 5.47 today (5.59 yesterday). H/H down slightly but platelets remain stable. No signs/symptoms of bleeding reported. MD ordered vitamin K 2.5 mg PO today.   PTA dose warfarin: 1.5 mg TuThSat and 3mg  all other days (per [facility] visit on 8/7)  Goal of Therapy:  INR 2.5-3.5 for mechanical AVR + Afib Monitor platelets by anticoagulation protocol: Yes   Plan:   1) Hold warfarin tonight 2) Daily CBC and INR   Thank you for allowing [place] to participate in this patients care.  Doylene Canard, PharmD Clinical Pharmacist  Phone: [phone] 11/19/2016 8:08 AM

## 2016-11-19 NOTE — Progress Notes (Signed)
CSW spoke with Ulice Dash, at [facility], and completed patient demographics screening. CSW to check in with [facility] daily for requested updates. Handoff given to unit CSW.  Percell Locus Brenda Samano LCSWA [phone]

## 2016-11-19 NOTE — Progress Notes (Signed)
FPTS Interim Progress Note  S:Patient sitting up and very talkative. He is singing and reading off the TV. Still confused, but doing much better and says he feels good.   O: BP 94/80 (BP Location: Right Arm)   Pulse 67   Temp 98 F (36.7 C) (Axillary)   Resp 19   Ht 6\' 3"  (1.905 m)   Wt 175 lb (79.4 kg)   SpO2 94%   BMI 21.87 kg/m   General: NAD, sitting in bed, pleasant, awake Card: Irregular rhythm, regular rate Neuro: Alert and oriented to person, confused Psych: Answers demonstrate tangential thought contents or word searching   A/P: Patient doing much better this evening, stable BP and HR. Place on floor.   Patti Shorb, [place], DO 11/19/2016, 3:17 PM PGY-1, [facility] Service pager [phone]

## 2016-11-19 NOTE — Progress Notes (Signed)
Patient transferred to [phone] via bed with belongings. VSS.

## 2016-11-19 NOTE — Progress Notes (Signed)
Family Medicine Teaching Service Daily Progress Note Intern Pager: [phone]  Patient name: Juan Wilkerson Medical record number: [id number] Date of birth: 09-13-1940 Age: 76 y.o. Gender: male  Primary Care Provider: Lovenia Kim, MD Consultants: Psychiatry Code Status: FULL CODE  Pt Overview and Major Events to Date:  Juan Wilkerson is a 76 y.o. male presenting with altered mental status. PMH is significant for atrial flutter/fibrillation on warfarin, hypertension, hyperlipidemia, Marfan's syndrome, Aortic valve replacement, microscopic hematuria, left renal mass 9/6- Psychiatric assessment recommended inpatient placement ([facility])  Assessment and Plan:  UTI meeting septsis criteria:  Afebrile for over 24 hours with normal vitals. WBC count increased from 8.3> 9.3 on 09/13. Work up for sepsis including urine culture which grew gram negative rods so far, and blood culture is pending.  - Monitor vitals, yesterday patient had episode of HR to 40s and hypotensive EKG showed no significant change, and troponin stable at 0.08.  - Vanc (9/11-9/12) and Cefepime (9/11-) - Consider moving to floor if remains stable   Altered mental status: He has tolerated partial restraint removal without significant behavioral problems- not requiring them currently. Tangential thought content during conversation. 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 -Soft restraints, mittens, if agitated -Avoid haldol (anti-dopaminergic in setting of suspected LBD) -Inpatient psychiatric placement process started - will obtain SLP eval for swallowing - Psych re-evaluated and stopped risperidone due to patient's new onset sedation occurring in the afternoons   Anticoagulation for Mechanical aortic valve: Supra-therapeutic INR 2.02> 5.47 on 09/13, range for a aortic mechanical valve with A-fib is 2.5-3.5.  -Coumadin 5mg  PM- held last night and  to be held today,will give 2.5 of vitamin K if number continues to  increase -Repeat INR in AM - pharmacy assistance for monitoring and titration of anticoagulation meds is appreciated  AKI: Resolved.. Patient increase in Cr from 0.98 > 0.96 on 09/13. Likely pre-renal from dehydration. - Monitor - Foley inserted 09/12 with 1.4 L drained from bladder.  Constipation: Improved.   - miralax prescribed  Atrial flutter/fibrillation without RVR: Rate controlled on metoprolol.  - EKG on 09/12 showed known atrial flutter, and no real change otherwise from prior. Has known RBBB and anterior fascicular block as well.  - Anticoagulation as above. - Metoprolol 25 mg BID decreased to 12.5 mg BID  Stage 1 sacral ulcer: Staff aware  - Monitor and continue to rotate in bed to relieve pressure of area  Mild L shoulder/ trapezius pain: Resolved. - tylenol for pain - Monitor - Noted stable cyst that 1 in by 1 in on mid left back, mobile and most likely benign  Hypokalemia: K 3.2 this am - Will give 40 of Kdur  FEN/GI: Heart healthy diet VTE Prophylaxis: Coumadin  Disposition: Inpatient psychiatric admission  Subjective:  Juan Wilkerson is doing very well this morning. Recognized me. He is still very confused. Oriented to self. Participates in exam. Denies pain except at new IV location that was just placed.  Objective: Temp:  [98.3 F (36.8 C)-99 F (37.2 C)] 98.7 F (37.1 C) (09/13 0700) Pulse Rate:  [31-85] 31 (09/13 0700) Resp:  [16-27] 26 (09/13 0700) BP: (83-125)/(63-78) 125/76 (09/13 0700) SpO2:  [94 %-99 %] 96 % (09/13 0700) Physical Exam: General: NAD, laying comfortably, awake and alert HEENT: moist mucous membranes Cardiovascular: Irregular rhythm, normal rate, loud S2 Respiratory: CTAB, normal WOB Skin: 1 in by 1 in mobile cyst on left mid back no erythema noted Extremities: stage 1 pressure ulcer on sacrum Neuro:  Alert and oriented to person, confused Psych: Answers demonstrate tangential thought contents or word searching    Laboratory:  Recent Labs Lab 11/17/16 1524 11/18/16 0346 11/19/16 0535  WBC 13.7* 14.2* 9.3  HGB 15.7 15.5 13.8  HCT 47.1 46.8 42.3  PLT 89* 94* 107*    Recent Labs Lab 11/17/16 1524 11/18/16 0346 11/18/16 1338 11/19/16 0535  NA 138 140 139 142  K 3.7 3.7 3.5 3.2*  CL 107 109 112* 112*  CO2 21* 25 21* 23  BUN 40* 43* 49* 40*  CREATININE 1.51* 1.47* 1.23 0.96  CALCIUM 8.8* 9.0 8.9 8.7*  PROT 6.1*  --   --   --   BILITOT 1.3*  --   --   --   ALKPHOS 91  --   --   --   ALT 19  --   --   --   AST 23  --   --   --   GLUCOSE 132* 144* 129* 111*   INR: 5.47  No results found. MRI 09/10 showed No acute intracranial abnormality. Small remote right posterior cortical and left cerebellar Infarcts. Mild generalized cerebral atrophy with nonspecific cerebral white matter changes, mildly progressed relative to most recent MRI from 2015.  Damica Gravlin, [place], DO 11/19/2016, 9:35 AM PGY-1, [place] Intern pager: [phone], text pages welcome

## 2016-11-19 NOTE — Evaluation (Signed)
Clinical/Bedside Swallow Evaluation Patient Details  Name: Juan Wilkerson MRN: [id number] Date of Birth: [birth year]  Today's Date: 11/19/2016 Time: SLP Start Time (ACUTE ONLY): 1308 SLP Stop Time (ACUTE ONLY): 1333 SLP Time Calculation (min) (ACUTE ONLY): 25 min  Past Medical History:  Past Medical History:  Diagnosis Date  . Aortic aneurysm ([place]) 1994   From presumed Marfan's Syndrome.  Followed by Dr Stanford Breed.  Aneurysm involving the proximal descending thoracic aorta .    Marland Kitchen Atrial fibrillation ([place])    Followed by Dr Stanford Breed.  On coumadin.  . Atrial flutter ([place])    Followed by Dr Stanford Breed  . History of blood transfusion   . Hyperlipidemia   . Hypertension   . Marfan's syndrome with aortic dilation   . Osteoarthritis   . Pulmonary nodules    Found on chest CT 08/2007.  Monitoring with yearly CT.  3.37mm nodule in R iddle lobe.   Past Surgical History:  Past Surgical History:  Procedure Laterality Date  . AORTIC VALVE REPLACEMENT  03/1993   St Jude Valve  . CARDIAC VALVE REPLACEMENT    . CORONARY ARTERY BYPASS GRAFT    . HIP FRACTURE SURGERY  03/1992   s/p R hip Fx  . IR GENERIC HISTORICAL  04/01/2016   IR RADIOLOGIST EVAL & MGMT 04/01/2016 Aletta Edouard, MD [facility] INTERV RAD  . KIDNEY SURGERY    . VASCULAR SURGERY    . Virtual Colonoscopy  11/01/2005   Pt on chronic coumadin for Aortic valve replacement and Atrial fibrilliation therefore at high risk if stopped.  Need to repeat every 5 yrs.    HPI:  76 y.o. male presenting with altered mental status on 11/11/16; PMH is significant for atrial flutter/fibrillation on warfarin, hypertension, hyperlipidemia, ?Marfan's syndrome, Aortic valve replacement, microscopic hematuria, left renal mass; pt with inadequate nutrition/refusing to eat likely d/t cognitive changes; Psych consult on 11/18/16 revealed possible need for inpatient admission when medically cleared; recent CXR on 11/17/16 indicated subtle increase in density in left suprahilar  region; could reflect developing upper lobe pneumonia; BSE ordered.   Assessment / Plan / Recommendation Clinical Impression   Pt exhibited a normal oropharyngeal swallow despite cognitive changes/AMS/confusion; he did require minimal cues to consume various consistencies d/t distractibility, but no overt s/s of aspiration noted throughout BSE; pt masticated medications while in puree consistency vs. swallowing whole in puree without apparent difficulty; speech is tangential and difficult to comprehend because he is speaking out of context during interactions; recommend continue current diet consistency (heart healthy regular) with thin liquids with staff to assist with self-feeding to increase overall consumption of diet d/t cognitive deficits present; ST will s/o at this time. SLP Visit Diagnosis: Dysphagia, unspecified (R13.10)    Aspiration Risk  Risk for inadequate nutrition/hydration    Diet Recommendation   Regular (Heart Healthy)/thin liquids  Medication Administration: Other (Comment) (as tolerated; pt chewed pills whole when placed in puree)    Other  Recommendations Oral Care Recommendations: Oral care BID   Follow up Recommendations None      Frequency and Duration   n/a         Prognosis Prognosis for Safe Diet Advancement: Good Barriers to Reach Goals: Cognitive deficits Barriers/Prognosis Comment:  (Recommendations for IP Psychological stay after med stable)      Swallow Study   General Date of Onset: 11/11/16 HPI: 76 y.o. male presenting with altered mental status. PMH is significant for atrial flutter/fibrillation on warfarin, hypertension, hyperlipidemia, ?Marfan's syndrome, Aortic valve  replacement, microscopic hematuria, left renal mass Type of Study: Bedside Swallow Evaluation Diet Prior to this Study: Regular;Thin liquids Temperature Spikes Noted: No Respiratory Status: Room air History of Recent Intubation: No Behavior/Cognition:  Alert;Confused;Distractible;Requires cueing Oral Cavity Assessment: Within Functional Limits Oral Care Completed by SLP: No Oral Cavity - Dentition: Adequate natural dentition;Missing dentition Vision: Functional for self-feeding Self-Feeding Abilities: Able to feed self;Needs assist Patient Positioning: Upright in bed Baseline Vocal Quality: Normal Volitional Cough: Strong Volitional Swallow: Able to elicit    Oral/Motor/Sensory Function Overall Oral Motor/Sensory Function: Within functional limits   Ice Chips Ice chips: Within functional limits   Thin Liquid Thin Liquid: Within functional limits Presentation: Cup;Self Fed;Straw    Nectar Thick Nectar Thick Liquid: Not tested   Honey Thick Honey Thick Liquid: Not tested   Puree Puree: Within functional limits Presentation: Self Fed   Solid      Solid: Within functional limits Presentation: Self Fed        Elvina Sidle, M.S., CCC-SLP 11/19/2016,1:38 PM

## 2016-11-20 LAB — BASIC METABOLIC PANEL
Anion gap: 7 (ref 5–15)
BUN: 29 mg/dL — ABNORMAL HIGH (ref 6–20)
CALCIUM: 9.3 mg/dL (ref 8.9–10.3)
CO2: 25 mmol/L (ref 22–32)
CREATININE: 0.76 mg/dL (ref 0.61–1.24)
Chloride: 111 mmol/L (ref 101–111)
GFR calc non Af Amer: >60 mL/min (ref >60)
Glucose, Bld: 113 mg/dL — ABNORMAL HIGH (ref 65–99)
Potassium: 3.2 mmol/L — ABNORMAL LOW (ref 3.5–5.1)
SODIUM: 143 mmol/L (ref 135–145)

## 2016-11-20 LAB — CBC
HCT: 45.5 % (ref 39.0–52.0)
Hemoglobin: 15.3 g/dL (ref 13.0–17.0)
MCH: 30.8 pg (ref 26.0–34.0)
MCHC: 33.6 g/dL (ref 30.0–36.0)
MCV: 91.7 fL (ref 78.0–100.0)
PLATELETS: 121 10*3/uL — AB (ref 150–400)
RBC: 4.96 MIL/uL (ref 4.22–5.81)
RDW: 13.7 % (ref 11.5–15.5)
WBC: 6.9 10*3/uL (ref 4.0–10.5)

## 2016-11-20 LAB — URINE CULTURE

## 2016-11-20 LAB — PROTIME-INR
INR: 2.25
PROTHROMBIN TIME: 24.7 s — AB (ref 11.4–15.2)

## 2016-11-20 MED ORDER — CEFEPIME HCL 2 G IJ SOLR
2.0000 g | Freq: Two times a day (BID) | INTRAMUSCULAR | Status: DC
  Administered 2016-11-20: 2 g via INTRAVENOUS
  Filled 2016-11-20: qty 2

## 2016-11-20 MED ORDER — SENNOSIDES-DOCUSATE SODIUM 8.6-50 MG PO TABS
1.0000 | ORAL_TABLET | Freq: Once | ORAL | Status: AC
  Administered 2016-11-20: 1 via ORAL
  Filled 2016-11-20: qty 1

## 2016-11-20 MED ORDER — POTASSIUM CHLORIDE CRYS ER 20 MEQ PO TBCR
40.0000 meq | EXTENDED_RELEASE_TABLET | Freq: Once | ORAL | Status: AC
  Administered 2016-11-20: 40 meq via ORAL
  Filled 2016-11-20: qty 2

## 2016-11-20 MED ORDER — WARFARIN - PHARMACIST DOSING INPATIENT
Freq: Every day | Status: DC
  Administered 2016-11-27 – 2016-11-30 (×2)

## 2016-11-20 MED ORDER — WARFARIN SODIUM 5 MG PO TABS
5.0000 mg | ORAL_TABLET | Freq: Once | ORAL | Status: AC
  Filled 2016-11-20: qty 1

## 2016-11-20 MED ORDER — CEFIXIME 400 MG PO CAPS
400.0000 mg | ORAL_CAPSULE | Freq: Every day | ORAL | Status: AC
  Administered 2016-11-20 – 2016-11-26 (×6): 400 mg via ORAL
  Filled 2016-11-20 (×7): qty 1

## 2016-11-20 NOTE — Progress Notes (Signed)
ANTICOAGULATION CONSULT NOTE - Follow Up Consult  Pharmacy Consult for Warfarin Indication: atrial fibrillation and mechanical AVR   Patient Measurements: Height: 6\' 3"  (190.5 cm) Weight: 175 lb (79.4 kg) IBW/kg (Calculated) : 84.5 Heparin Dosing Weight: 79.4 kg  Vital Signs: Temp: 97.5 F (36.4 C) (09/14 0556) BP: 123/69 (09/14 0556) Pulse Rate: 55 (09/14 0556)  Labs:  Recent Labs  11/17/16 2126 11/18/16 0346 11/18/16 1338 11/19/16 0535 11/20/16 0646  HGB  --  15.5  --  13.8 15.3  HCT  --  46.8  --  42.3 45.5  PLT  --  94*  --  107* 121*  LABPROT  --  49.8*  --  49.4* 24.7*  INR  --  5.59*  --  5.47* 2.25  CREATININE  --  1.47* 1.23 0.96  --   TROPONINI 0.08* 0.08* 0.08*  --   --     Estimated Creatinine Clearance: 72.4 mL/min (by C-G formula based on SCr of 0.96 mg/dL).   Assessment: 76 yo M on warfarin pta for mechanical AVR and afib, admitted with subtherapeutic INR, likely from noncompliance with medications due to AMS.    He was started on a heparin bridge. Patient given higher 5mg  doses (for 5 days). INR doubled to 4.49 and heparin was stopped. INR was stable at 5.47 yesterday (5.59 9/12). H/H wnl, platelets stable. No signs/symptoms of bleeding reported. Vitamin K 2.5 mg PO given 9/13. INR is now subtherapeutic at 2.25. If continues to trend down may need to consider bridging again, will monitor for today.  PTA dose warfarin: 1.5 mg TuThSat and 3mg  all other days (per [facility] visit on 8/7)  Goal of Therapy:  INR 2.5-3.5 for mechanical AVR + Afib Monitor platelets by anticoagulation protocol: Yes   Plan:   Give warfarin 5 mg PO x 1 now Monitor daily INR, CBC, clinical course, s/sx of bleed, PO intake, DDI   Thank you for allowing [place] to participate in this patients care.  Jens Som, PharmD Clinical phone for 11/20/2016 from 7a-3:30p: [phone] If after 3:30p, please call main pharmacy at: [phone] 11/20/2016 7:38 AM

## 2016-11-20 NOTE — Progress Notes (Signed)
Family Medicine Teaching Service Daily Progress Note Intern Pager: [phone]  Patient name: Juan Wilkerson Medical record number: [id number] Date of birth: [birth year] Age: 76 y.o. Gender: male  Primary Care Provider: Lovenia Kim, MD Consultants: Psychiatry Code Status: FULL CODE  Pt Overview and Major Events to Date:  Juan Wilkerson is a 76 y.o. male presenting with altered mental status. PMH is significant for atrial flutter/fibrillation on warfarin, hypertension, hyperlipidemia, Marfan's syndrome, Aortic valve replacement, microscopic hematuria, left renal mass 9/6- Psychiatric assessment recommended inpatient placement ([facility])  Assessment and Plan:  UTI meeting septsis criteria: Resolved. Afebrile with normal vitals. WBC count 8.3> 6.9 on 09/13. Urine culture resulted as below and blood culture is pending.  - Urine culture grew Klebsiella and Citrobacter- started on PO abx - Vanc (9/11-9/12) and Cefepime (9/11-9/13), Cefixime started (9/14-) for 10 day treatment, end on (9/20)  Altered mental status: Improved. He is alert and oriented to self and place. He has tolerated partial restraint removal without significant behavioral problems- has on one mitten this am. Tangential thought content during conversation. 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 -Soft restraints, mittens, if agitated -Avoid haldol (anti-dopaminergic in setting of suspected LBD) -Inpatient psychiatric placement process started - SLP cleared for regular diet with thin liquids - Psych re-evaluated, appreciate recs  Anticoagulation for Mechanical aortic valve: Less than therapeutic INR s/p 2.5 of vitamin K: 2.02> 2.25 on 09/14, range for a aortic mechanical valve with A-fib is 2.5-3.5.  -Coumadin 5mg  to be administered this am -Repeat INR in AM - pharmacy assistance for monitoring and titration of anticoagulation meds is appreciated  Acute Urinary Retention: attempt voiding trial today - Foley in place  09/12 with 1/4 L drained from bladder  AKI: Resolved. Patient increase in Cr from 0.98 > 0.76 on 09/14. Likely pre-renal from dehydration. - Monitor  Constipation: Improved.   - miralax prescribed - given senna- docusate this am  Atrial flutter/fibrillation without RVR: Rate controlled on metoprolol.  - EKG on 09/12 showed known atrial flutter, and no real change otherwise from prior. Has known RBBB and anterior fascicular block as well.  - Anticoagulation as above. - Metoprolol 12.5 mg BID  Stage 1 sacral ulcer: Staff aware  - Monitor and continue to rotate in bed to relieve pressure of area  Mild L shoulder/ trapezius pain: Resolved. - tylenol for pain - Monitor - Noted stable cyst that 1 in by 1 in on mid left back, mobile and most likely benign  Hypokalemia: K 3.2 this am - Will give 40 of Kdur  FEN/GI: Heart healthy diet VTE Prophylaxis: Coumadin  Disposition: Inpatient psychiatric admission  Subjective:  Juan Wilkerson is doing very well this morning. Recognized me. Oriented to self and place. Participates in exam. Denies pain. Reports that he is hungry, and tech will be in to feed him as he has one mitten on.  Objective: Temp:  [97.5 F (36.4 C)-98.4 F (36.9 C)] 97.5 F (36.4 C) (09/14 0556) Pulse Rate:  [55-100] 55 (09/14 0556) Resp:  [17-20] 18 (09/14 0556) BP: (92-123)/(67-92) 123/69 (09/14 0556) SpO2:  [94 %-97 %] 97 % (09/14 0556) Physical Exam: General: NAD, laying comfortably, awake and alert HEENT: moist mucous membranes Cardiovascular: Irregular rhythm, normal rate, loud S2 Respiratory: CTAB, normal WOB Skin: 1 in by 1 in mobile cyst on left mid back no erythema noted Extremities: stage 1 pressure ulcer on sacrum Neuro: Alert and oriented to person and place, less confused Psych: Answers demonstrate tangential thought contents or  word searching   Laboratory:  Recent Labs Lab 11/18/16 0346 11/19/16 0535 11/20/16 0646  WBC 14.2* 9.3 6.9  HGB 15.5  13.8 15.3  HCT 46.8 42.3 45.5  PLT 94* 107* 121*    Recent Labs Lab 11/17/16 1524  11/18/16 1338 11/19/16 0535 11/20/16 0646  NA 138  < > 139 142 143  K 3.7  < > 3.5 3.2* 3.2*  CL 107  < > 112* 112* 111  CO2 21*  < > 21* 23 25  BUN 40*  < > 49* 40* 29*  CREATININE 1.51*  < > 1.23 0.96 0.76  CALCIUM 8.8*  < > 8.9 8.7* 9.3  PROT 6.1*  --   --   --   --   BILITOT 1.3*  --   --   --   --   ALKPHOS 91  --   --   --   --   ALT 19  --   --   --   --   AST 23  --   --   --   --   GLUCOSE 132*  < > 129* 111* 113*  < > = values in this interval not displayed. INR: 2.25  No results found. MRI 09/10 showed No acute intracranial abnormality. Small remote right posterior cortical and left cerebellar Infarcts. Mild generalized cerebral atrophy with nonspecific cerebral white matter changes, mildly progressed relative to most recent MRI from 2015.  Veretta Sabourin, [place], DO 11/20/2016, 8:11 AM PGY-1, [place] Intern pager: [phone], text pages welcome

## 2016-11-20 NOTE — Progress Notes (Signed)
Patient's Secondary school teacher, Waylan Boga [phone], or Gerald Stabs [phone]), came to the hospital to ask for contact information for patient's family. CSW provided contact info to patient's brother in [place]. Patient's brother is not aware of patient's financial situation, but accepted the contact info. He provided contact info for patient's neighbor, Lanelle Bal, who is holding patient's mail [phone]) and pastor, Joycelyn Schmid, [phone]). CSW explained that in the event patient does not clear cognitively, brother will have to make decision on if he will file for Guardianship.   CSW continuing to find [facility] psych placement. On waitlist at [facility].   Percell Locus Saniyah Mondesir LCSWA [phone]

## 2016-11-20 NOTE — Progress Notes (Signed)
FPTS Interim Progress Note  S:Patient sitting up and drinking apple juice. Working on eating his lunch. Says he feels fine and has no complaints other than the food isnt very good.   O: BP 133/67   Pulse (!) 101   Temp (!) 97.5 F (36.4 C)   Resp 18   Ht 6\' 3"  (1.905 m)   Wt 175 lb (79.4 kg)   SpO2 97%   BMI 21.87 kg/m   General: NAD, sitting and drinking, pleasant Card: Irregular rhythm, regular rate Neuro: A& O to self and place Psych: confused and tangential thought with word finding  A/P: Patient doing well, will order a sitter to prevent him from getting out of bed with no assistance to prevent fall  Juan Wilkerson, [place], DO 11/20/2016, 1:39 PM PGY-1, [place] Medicine Service pager [phone]

## 2016-11-20 NOTE — Progress Notes (Signed)
Physical Therapy Treatment Patient Details Name: Juan Wilkerson MRN: [id number] DOB: Jan 03, 1941 Today's Date: 11/20/2016    History of Present Illness Juan Wilkerson is a 76 y.o. male presenting with altered mental status. PMH is significant for atrial flutter/fibrillation on warfarin, hypertension, hyperlipidemia, ?Marfan's syndrome, Aortic valve replacement, microscopic hematuria, left renal mass    PT Comments    Mod assist for supine to sit. Pt unable to come to full standing position with max assist due to heavy posterior lean and flexed posture. Performed seated BLE exercises.   Follow Up Recommendations  SNF     Equipment Recommendations  None recommended by PT    Recommendations for Other Services       Precautions / Restrictions Precautions Precautions: Fall Restrictions Weight Bearing Restrictions: No    Mobility  Bed Mobility Overal bed mobility: Needs Assistance Bed Mobility: Supine to Sit;Sit to Supine     Supine to sit: Total assist;Mod assist Sit to supine: Total assist;Max assist   General bed mobility comments: mod A to raise trunk and scoot to EOB  Transfers Overall transfer level: Needs assistance Equipment used: Rolling walker (2 wheeled) Transfers: Sit to/from Stand Sit to Stand: Max assist;Total assist         General transfer comment: attempted to stand from EOB x 1, pt unable to achieve full standing position; had flexed hips/knees/trunk and posterior lean, so returned to sitting on EOB  Ambulation/Gait             General Gait Details: pt unable today   Stairs            Wheelchair Mobility    Modified Rankin (Stroke Patients Only)       Balance Overall balance assessment: Needs assistance Sitting-balance support: Feet supported;Single extremity supported Sitting balance-Leahy Scale: Fair   Postural control: Posterior lean Standing balance support: Bilateral upper extremity supported Standing balance-Leahy Scale:  Zero Standing balance comment: unable to achieve full stand this visit due to posterior lean and flexed posture                            Cognition Arousal/Alertness: Lethargic Behavior During Therapy: Flat affect Overall Cognitive Status: No family/caregiver present to determine baseline cognitive functioning                                 General Comments: pt not oriented to location nor situation, can state birthdate, verbalizations are nonsensical      Exercises  long arc quads x 10 both legs sitting AROM    General Comments        Pertinent Vitals/Pain Faces Pain Scale: Hurts a little bit Pain Location: pt moaned and reached for L shoulder with movement, pt unable to provide further details Pain Descriptors / Indicators: Grimacing;Moaning Pain Intervention(s): Limited activity within patient's tolerance;Monitored during session;Repositioned    Home Living                      Prior Function            PT Goals (current goals can now be found in the care plan section) Acute Rehab PT Goals Patient Stated Goal: none stated PT Goal Formulation: Patient unable to participate in goal setting Time For Goal Achievement: 11/27/16 Potential to Achieve Goals: Fair Progress towards PT goals: Progressing toward goals    Frequency  Min 2X/week      PT Plan Frequency needs to be updated    Co-evaluation              AM-PAC PT "6 Clicks" Daily Activity  Outcome Measure  Difficulty turning over in bed (including adjusting bedclothes, sheets and blankets)?: Unable Difficulty moving from lying on back to sitting on the side of the bed? : Unable Difficulty sitting down on and standing up from a chair with arms (e.g., wheelchair, bedside commode, etc,.)?: Unable Help needed moving to and from a bed to chair (including a wheelchair)?: Total Help needed walking in hospital room?: Total Help needed climbing 3-5 steps with a railing?  : Total 6 Click Score: 6    End of Session Equipment Utilized During Treatment: Gait belt Activity Tolerance: Patient tolerated treatment well Patient left: in bed;with bed alarm set;with call bell/phone within reach Nurse Communication: Mobility status PT Visit Diagnosis: Unsteadiness on feet (R26.81);Other abnormalities of gait and mobility (R26.89);Other (comment)     Time: 1003-1020 PT Time Calculation (min) (ACUTE ONLY): 17 min  Charges:  $Therapeutic Activity: 8-22 mins                    G Codes:        Philomena Doheny 11/20/2016, 10:36 AM [phone]

## 2016-11-20 NOTE — Progress Notes (Signed)
ANTIBIOTIC CONSULT NOTE - Follow up  Pharmacy Consult for Cefepime Indication: sepsis/UTI  Allergies  Allergen Reactions  . Lemon Oil Other (See Comments)    Only skin contact dermatitis    Patient Measurements: Height: 6\' 3"  (190.5 cm) Weight: 175 lb (79.4 kg) IBW/kg (Calculated) : 84.5  Vital Signs: Temp: 97.5 F (36.4 C) (09/14 0556) BP: 123/69 (09/14 0556) Pulse Rate: 55 (09/14 0556) Intake/Output from previous day: 09/13 0701 - 09/14 0700 In: 470 [P.O.:420; IV Piggyback:50] Out: 2750 [Urine:2750] Intake/Output from this shift: No intake/output data recorded.  Labs:  Recent Labs  11/18/16 0346 11/18/16 1338 11/19/16 0535 11/20/16 0646  WBC 14.2*  --  9.3 6.9  HGB 15.5  --  13.8 15.3  PLT 94*  --  107* 121*  CREATININE 1.47* 1.23 0.96 0.76   Estimated Creatinine Clearance: 86.8 mL/min (by C-G formula based on SCr of 0.76 mg/dL).   Medical History: Past Medical History:  Diagnosis Date  . Aortic aneurysm ([place]) 1994   From presumed Marfan's Syndrome.  Followed by Dr Stanford Breed.  Aneurysm involving the proximal descending thoracic aorta .    Marland Kitchen Atrial fibrillation ([place])    Followed by Dr Stanford Breed.  On coumadin.  . Atrial flutter ([place])    Followed by Dr Stanford Breed  . History of blood transfusion   . Hyperlipidemia   . Hypertension   . Marfan's syndrome with aortic dilation   . Osteoarthritis   . Pulmonary nodules    Found on chest CT 08/2007.  Monitoring with yearly CT.  3.79mm nodule in R iddle lobe.    Assessment: Juan Wilkerson a 76 y.o.malepresenting with altered mental status. PMH is significant for microscopic hematuria, left renal mass. Pharmacy consulted to dose Cefepime for sepis/UTI. Temp low at 97.5 this morning. WBC wnl, Scr down to 0.76, estCrCl now > 50.  Antimicrobials this admission:  Vanco 9/11>>9/12 Cefepime 9/11>>  Dose adjustments this admission:  9/14: Adjusted Cefepime to q 12 hours  Microbiology results:  9/11 BCx: ngtd 9/11  MRSA PCR: negative 9/11 UCx: >=100,000 COLONIES/mL KLEBSIELLA PNEUMONIAE                   >=100,000 COLONIES/mL CITROBACTER FREUNDII  Plan:  Increase Cefepime 2g IV to q12h Monitor clinical progress, cultures/sensitivities, renal function, abx plan   Thank you for allowing [place] to participate in this patients care.  Jens Som, PharmD Clinical phone for 11/20/2016 from 7a-3:30p: [phone] If after 3:30p, please call main pharmacy at: [phone] 11/20/2016 8:26 AM

## 2016-11-21 LAB — PROTIME-INR
INR: 2.04
Prothrombin Time: 22.9 seconds — ABNORMAL HIGH (ref 11.4–15.2)

## 2016-11-21 LAB — BASIC METABOLIC PANEL
Anion gap: 7 (ref 5–15)
BUN: 32 mg/dL — AB (ref 6–20)
CHLORIDE: 108 mmol/L (ref 101–111)
CO2: 29 mmol/L (ref 22–32)
CREATININE: 0.75 mg/dL (ref 0.61–1.24)
Calcium: 9.4 mg/dL (ref 8.9–10.3)
GFR calc Af Amer: >60 mL/min (ref >60)
GFR calc non Af Amer: >60 mL/min (ref >60)
GLUCOSE: 122 mg/dL — AB (ref 65–99)
Potassium: 3.1 mmol/L — ABNORMAL LOW (ref 3.5–5.1)
Sodium: 144 mmol/L (ref 135–145)

## 2016-11-21 LAB — CBC
HEMATOCRIT: 49 % (ref 39.0–52.0)
Hemoglobin: 16 g/dL (ref 13.0–17.0)
MCH: 29.9 pg (ref 26.0–34.0)
MCHC: 32.7 g/dL (ref 30.0–36.0)
MCV: 91.6 fL (ref 78.0–100.0)
Platelets: 149 10*3/uL — ABNORMAL LOW (ref 150–400)
RBC: 5.35 MIL/uL (ref 4.22–5.81)
RDW: 13.7 % (ref 11.5–15.5)
WBC: 5.7 10*3/uL (ref 4.0–10.5)

## 2016-11-21 MED ORDER — ATORVASTATIN CALCIUM 40 MG PO TABS
40.0000 mg | ORAL_TABLET | Freq: Every day | ORAL | Status: DC
  Administered 2016-11-21 – 2016-11-26 (×6): 40 mg via ORAL
  Filled 2016-11-21 (×6): qty 1

## 2016-11-21 MED ORDER — SENNOSIDES-DOCUSATE SODIUM 8.6-50 MG PO TABS
1.0000 | ORAL_TABLET | Freq: Two times a day (BID) | ORAL | Status: DC
  Administered 2016-11-21 – 2016-12-02 (×21): 1 via ORAL
  Filled 2016-11-21 (×21): qty 1

## 2016-11-21 MED ORDER — WARFARIN SODIUM 5 MG PO TABS
5.0000 mg | ORAL_TABLET | Freq: Once | ORAL | Status: AC
  Administered 2016-11-21: 5 mg via ORAL
  Filled 2016-11-21: qty 1

## 2016-11-21 MED ORDER — FINASTERIDE 5 MG PO TABS
5.0000 mg | ORAL_TABLET | Freq: Every day | ORAL | Status: DC
  Administered 2016-11-21 – 2016-12-02 (×10): 5 mg via ORAL
  Filled 2016-11-21 (×10): qty 1

## 2016-11-21 MED ORDER — POTASSIUM CHLORIDE CRYS ER 20 MEQ PO TBCR
40.0000 meq | EXTENDED_RELEASE_TABLET | Freq: Once | ORAL | Status: AC
  Administered 2016-11-21: 40 meq via ORAL
  Filled 2016-11-21: qty 2

## 2016-11-21 NOTE — Progress Notes (Signed)
Family Medicine Teaching Service Daily Progress Note Intern Pager: [phone]  Patient name: Juan Wilkerson Medical record number: [id number] Date of birth: 1940-07-26 Age: 76 y.o. Gender: male  Primary Care Provider: Lovenia Kim, MD Consultants: Psychiatry Code Status: FULL CODE  Pt Overview and Major Events to Date:  9/5 Admitted for AMS 9/6 Psychiatric assessment recommended inpatient placement (Geri-psych)  Assessment and Plan: Juan Wilkerson is a 76 y.o. male presenting with altered mental status. PMH is significant for atrial flutter/fibrillation on warfarin, hypertension, hyperlipidemia, Marfan's syndrome, Aortic valve replacement, microscopic hematuria, left renal mass  UTI in setting of resolved sepsis: Afebrile overnight. Urine culture + Klebsiella and Citrobacter. Blood culture NGTD x 3 days. s/p Vanc (9/11-9/12), Cefepime (9/11-9/13) - Cefixime started (9/14-) for 10 day treatment, end on (9/20)  Altered mental status 2/2 suspected Lewy Body Dementia: Improved. Alert and oriented to self only and with confabulation. Has tolerated partial restraint removal without significant behavioral problems.   - Soft restraints, mittens, if agitated - Avoid haldol (anti-dopaminergic in setting of suspected LBD) - SLP cleared for regular diet with thin liquids - Psych re-evaluated, appreciate recs. Geodon 10mg  IM qhs prn agitation and aggression only. No more than 2 shots in 24 hours. - Awaiting inpatient Geri-psych placement  Anticoagulation for Mechanical aortic valve: subtherapeutic INR 2.04 today. s/p 2.5 of vitamin K on 9/13 for supratherapeutic INR. Goal range 2.5-3.5.  - Coumadin per pharmacy - monitor INR  Acute Urinary Retention: failed voiding trial, foley replaced 9/14 overnight - monitor I/O - continue flomax - start finasteride before reattempt voiding trial  Constipation: last BM 9/11 - continue scheduled miralax, senna-docusate  - monitor output  Atrial  flutter/fibrillation without RVR: Rate controlled on metoprolol.  - Anticoagulation as above. - Metoprolol 12.5 mg BID  Stage 1 sacral ulcer: Staff aware  - Monitor and continue to rotate in bed to relieve pressure of area  Hypokalemia: K 3.1 this am - Repleted with Kdur 40 mEq today  Hypertension: Normotensive - continue metoprolol and home norvasc   Hyperlipidemia: On pravastatin at home. ASCVD 51yr risk 29.5% based on lipid panel from 11/12/16. - start atorvastatin   Resolved L shoulder/ trapezius pain: Noted stable cyst that 1 in by 1 in on mid left back, mobile and most likely benign - tylenol prn for pain  Resolved AKI: Peak Cr 1.51 likely pre-renal from dehydration. SCr 0.75 today - Monitor Cr  FEN/GI: Heart healthy diet VTE Prophylaxis: Coumadin  Disposition: pending Geri-psych placement  Subjective:   States feels well this morning, no concerns.  Denies pain. Reports that feels the need to have a BM this morning.  Objective: Temp:  [98 F (36.7 C)-98.4 F (36.9 C)] 98 F (36.7 C) (09/15 0503) Pulse Rate:  [70-101] 70 (09/15 0503) Resp:  [16-20] 18 (09/15 0503) BP: (120-133)/(67-88) 122/75 (09/15 0503) SpO2:  [94 %-98 %] 98 % (09/15 0503) Physical Exam: General: NAD, sitting up comfortably in bed, awake and alert HEENT: moist mucous membranes Cardiovascular: Irregular rhythm, normal rate, loud S2 Respiratory: CTAB, normal WOB Skin: 1 in by 1 in mobile cyst on left mid back no erythema noted, otherwise WWP. Extremities: no LE edema, soft mitts in place on hands b/l. Neuro: Alert and oriented to person only. Not oriented to place or time. Psych: speech with confabulation. No agitation.   Laboratory:  Recent Labs Lab 11/19/16 0535 11/20/16 0646 11/21/16 0530  WBC 9.3 6.9 5.7  HGB 13.8 15.3 16.0  HCT 42.3 45.5 49.0  PLT 107* 121* 149*    Recent Labs Lab 11/17/16 1524  11/19/16 0535 11/20/16 0646 11/21/16 0530  NA 138  < > 142 143 144  K 3.7  <  > 3.2* 3.2* 3.1*  CL 107  < > 112* 111 108  CO2 21*  < > 23 25 29   BUN 40*  < > 40* 29* 32*  CREATININE 1.51*  < > 0.96 0.76 0.75  CALCIUM 8.8*  < > 8.7* 9.3 9.4  PROT 6.1*  --   --   --   --   BILITOT 1.3*  --   --   --   --   ALKPHOS 91  --   --   --   --   ALT 19  --   --   --   --   AST 23  --   --   --   --   GLUCOSE 132*  < > 111* 113* 122*  < > = values in this interval not displayed. INR: 2.25  Imaging/Diagnostic Tests: No results found. MRI 09/10: No acute intracranial abnormality. Small remote right posterior cortical and left cerebellar Infarcts. Mild generalized cerebral atrophy with nonspecific cerebral white matter changes, mildly progressed relative to most recent MRI from 2015.  [name], DO 11/21/2016, 7:58 AM PGY-2, [place] Intern pager: [phone], text pages welcome

## 2016-11-21 NOTE — Progress Notes (Signed)
ANTICOAGULATION CONSULT NOTE - Follow Up Consult  Pharmacy Consult for Warfarin Indication: atrial fibrillation and mechanical AVR   Patient Measurements: Height: 6\' 3"  (190.5 cm) Weight: 175 lb (79.4 kg) IBW/kg (Calculated) : 84.5 Heparin Dosing Weight: 79.4 kg  Vital Signs: Temp: 98 F (36.7 C) (09/15 0503) Temp Source: Oral (09/15 0503) BP: 122/75 (09/15 0503) Pulse Rate: 70 (09/15 0503)  Labs:  Recent Labs  11/18/16 1338  11/19/16 0535 11/20/16 0646 11/21/16 0530  HGB  --   < > 13.8 15.3 16.0  HCT  --   --  42.3 45.5 49.0  PLT  --   --  107* 121* 149*  LABPROT  --   --  49.4* 24.7* 22.9*  INR  --   --  5.47* 2.25 2.04  CREATININE 1.23  --  0.96 0.76 0.75  TROPONINI 0.08*  --   --   --   --   < > = values in this interval not displayed.  Estimated Creatinine Clearance: 86.8 mL/min (by C-G formula based on SCr of 0.75 mg/dL).   Assessment: 77 yo M on warfarin pta for mechanical AVR and afib, admitted with subtherapeutic INR, likely from noncompliance with medications due to AMS.    He was started on a heparin bridge. Patient given higher 5mg  doses (for 5 days). INR doubled to 4.49 and heparin was stopped. INR was stable at 5.47 yesterday (5.59 9/12). H/H wnl, platelets stable. Vitamin K 2.5 mg PO given 9/13.  Nurse did not give dose 9/14 and INR remains subtherapeutic at 2.04; communicated with nurse today and will consider bridge tomorrow if INR keeps trending down.  No bleeding reported at this time.  PTA dose warfarin: 1.5 mg TuThSat and 3mg  all other days (per [facility] on 8/7)  Goal of Therapy:  INR 2.5-3.5 for mechanical AVR + Afib Monitor platelets by anticoagulation protocol: Yes   Plan:   Warfarin 5mg  PO x1 now Monitor daily INR, CBC, clinical course, s/sx of bleed, PO intake, DDI  Bertis Ruddy, PharmD Pharmacy Resident Pager #: [phone] 11/21/2016 9:21 AM

## 2016-11-22 LAB — BASIC METABOLIC PANEL
ANION GAP: 8 (ref 5–15)
BUN: 32 mg/dL — ABNORMAL HIGH (ref 6–20)
CO2: 25 mmol/L (ref 22–32)
Calcium: 9.2 mg/dL (ref 8.9–10.3)
Chloride: 112 mmol/L — ABNORMAL HIGH (ref 101–111)
Creatinine, Ser: 0.75 mg/dL (ref 0.61–1.24)
GLUCOSE: 112 mg/dL — AB (ref 65–99)
POTASSIUM: 3.3 mmol/L — AB (ref 3.5–5.1)
Sodium: 145 mmol/L (ref 135–145)

## 2016-11-22 LAB — CBC
HCT: 46.2 % (ref 39.0–52.0)
Hemoglobin: 15.5 g/dL (ref 13.0–17.0)
MCH: 30.9 pg (ref 26.0–34.0)
MCHC: 33.5 g/dL (ref 30.0–36.0)
MCV: 92 fL (ref 78.0–100.0)
PLATELETS: 153 10*3/uL (ref 150–400)
RBC: 5.02 MIL/uL (ref 4.22–5.81)
RDW: 13.5 % (ref 11.5–15.5)
WBC: 6.6 10*3/uL (ref 4.0–10.5)

## 2016-11-22 LAB — CULTURE, BLOOD (ROUTINE X 2)
CULTURE: NO GROWTH
Culture: NO GROWTH
SPECIAL REQUESTS: ADEQUATE
Special Requests: ADEQUATE

## 2016-11-22 LAB — PROTIME-INR
INR: 2.64
Prothrombin Time: 28 seconds — ABNORMAL HIGH (ref 11.4–15.2)

## 2016-11-22 MED ORDER — WARFARIN SODIUM 3 MG PO TABS
3.0000 mg | ORAL_TABLET | Freq: Once | ORAL | Status: AC
  Administered 2016-11-22: 3 mg via ORAL
  Filled 2016-11-22: qty 1

## 2016-11-22 MED ORDER — BISACODYL 10 MG RE SUPP
10.0000 mg | Freq: Once | RECTAL | Status: AC
  Administered 2016-11-22: 10 mg via RECTAL
  Filled 2016-11-22: qty 1

## 2016-11-22 MED ORDER — POTASSIUM CHLORIDE CRYS ER 20 MEQ PO TBCR
40.0000 meq | EXTENDED_RELEASE_TABLET | Freq: Once | ORAL | Status: AC
  Administered 2016-11-22: 40 meq via ORAL
  Filled 2016-11-22: qty 2

## 2016-11-22 NOTE — Progress Notes (Signed)
Family Medicine Teaching Service Daily Progress Note Intern Pager: [phone]  Patient name: Juan Wilkerson Medical record number: [id number] Date of birth: 11-30-1940 Age: 76 y.o. Gender: male  Primary Care Provider: Lovenia Kim, MD Consultants: Psychiatry Code Status: FULL CODE  Pt Overview and Major Events to Date:  9/5 Admitted for AMS 9/6 Psychiatric assessment recommended inpatient placement (Geri-psych)  Assessment and Plan: Juan Wilkerson is a 76 y.o. male presenting with altered mental status. PMH is significant for atrial flutter/fibrillation on warfarin, hypertension, hyperlipidemia, Marfan's syndrome, Aortic valve replacement, microscopic hematuria, left renal mass  UTI in setting of resolved sepsis: Afebrile overnight. Urine culture + Klebsiella and Citrobacter. Blood culture NGTD x 3 days. s/p Vanc (9/11-9/12), Cefepime (9/11-9/13) - Cefixime started (9/14-) for 10 day treatment, end on (9/20)  Altered mental status 2/2 suspected Lewy Body Dementia: Stable. Alert and oriented to self only and with confabulation. Has tolerated partial restraint removal without significant behavioral problems.  - Soft restraints, mittens, if agitated - Avoid haldol (anti-dopaminergic in setting of suspected LBD) - Psych re-evaluated, appreciate recs. Geodon 10mg  IM qhs prn agitation and aggression only. No more than 2 shots in 24 hours. - Awaiting inpatient Geri-psych placement  Anticoagulation for Mechanical aortic valve: INR at goal 2.64 today. Goal range 2.5-3.5.  - Coumadin per pharmacy - monitor INR  Acute Urinary Retention: failed voiding trial, foley replaced 9/14, will need to attempt voiding trial now that finasteride started on 9/15 - monitor I/O - continue flomax - continue finasteride  Constipation: last BM 9/11, underwent manual disimpaction today 9/16 - continue scheduled miralax, senna-docusate  - dulcolax suppository today 9/16 - monitor output  Atrial  flutter/fibrillation without RVR: Rate controlled on metoprolol.  - Anticoagulation as above. - Metoprolol 12.5 mg BID  Stage 1 sacral ulcer: Staff aware  - Monitor and continue to rotate in bed to relieve pressure of area  Hypokalemia: K 3.3 this am - Repleted with Kdur 40 mEq today  Hypertension: Normotensive - continue metoprolol and home norvasc   Hyperlipidemia: On pravastatin at home. ASCVD 7yr risk 29.5% based on lipid panel from 11/12/16. - continue atorvastatin   Resolved L shoulder/ trapezius pain: Noted stable cyst that 1 in by 1 in on mid left back, mobile and most likely benign - tylenol prn for pain  Resolved AKI: Peak Cr 1.51 likely pre-renal from dehydration. SCr 0.75 today - Monitor Cr  FEN/GI: Heart healthy diet VTE Prophylaxis: Coumadin  Disposition: pending Geri-psych placement  Subjective:   States feels well this morning, no concerns.  Denies pain.   Objective: Temp:  [97.5 F (36.4 C)-98.6 F (37 C)] 97.5 F (36.4 C) (09/16 0516) Pulse Rate:  [67] 67 (09/16 0516) Resp:  [18] 18 (09/16 0516) BP: (105-135)/(70-77) 135/77 (09/16 0516) SpO2:  [96 %-97 %] 97 % (09/16 0516) Physical Exam: General: NAD, sitting up comfortably in bed, awake and alert HEENT: moist mucous membranes Cardiovascular: Irregular rhythm, normal rate, loud S2 Respiratory: CTAB, normal WOB Abdomen: soft, nontender, nondistended, + bowel sounds. Rectal: hard stool in rectal vault, manually disimpacted with soft brown stool remaining Extremities: no LE edema, soft mitts in place on hands b/l. Neuro: Alert and oriented to person only. Not oriented to place or time. Psych: speech with confabulation. No agitation.   Laboratory:  Recent Labs Lab 11/19/16 0535 11/20/16 0646 11/21/16 0530  WBC 9.3 6.9 5.7  HGB 13.8 15.3 16.0  HCT 42.3 45.5 49.0  PLT 107* 121* 149*    Recent Labs  Lab 11/17/16 1524  11/19/16 0535 11/20/16 0646 11/21/16 0530  NA 138  < > 142 143 144  K  3.7  < > 3.2* 3.2* 3.1*  CL 107  < > 112* 111 108  CO2 21*  < > 23 25 29   BUN 40*  < > 40* 29* 32*  CREATININE 1.51*  < > 0.96 0.76 0.75  CALCIUM 8.8*  < > 8.7* 9.3 9.4  PROT 6.1*  --   --   --   --   BILITOT 1.3*  --   --   --   --   ALKPHOS 91  --   --   --   --   ALT 19  --   --   --   --   AST 23  --   --   --   --   GLUCOSE 132*  < > 111* 113* 122*  < > = values in this interval not displayed. INR: 2.25  Imaging/Diagnostic Tests: MRI 09/10: No acute intracranial abnormality. Small remote right posterior cortical and left cerebellar Infarcts. Mild generalized cerebral atrophy with nonspecific cerebral white matter changes, mildly progressed relative to most recent MRI from 2015.  [name], DO 11/22/2016, 7:21 AM PGY-2, [place] Intern pager: [phone], text pages welcome

## 2016-11-22 NOTE — Progress Notes (Signed)
ANTICOAGULATION CONSULT NOTE - Follow Up Consult  Pharmacy Consult for Warfarin Indication: atrial fibrillation and mechanical AVR   Patient Measurements: Height: 6\' 3"  (190.5 cm) Weight: 175 lb (79.4 kg) IBW/kg (Calculated) : 84.5 Heparin Dosing Weight: 79.4 kg  Vital Signs: Temp: 97.5 F (36.4 C) (09/16 0516) Temp Source: Oral (09/16 0516) BP: 135/77 (09/16 0516) Pulse Rate: 67 (09/16 0516)  Labs:  Recent Labs  11/20/16 0646 11/21/16 0530 11/22/16 0700  HGB 15.3 16.0 15.5  HCT 45.5 49.0 46.2  PLT 121* 149* 153  LABPROT 24.7* 22.9* 28.0*  INR 2.25 2.04 2.64  CREATININE 0.76 0.75 0.75    Estimated Creatinine Clearance: 86.8 mL/min (by C-G formula based on SCr of 0.75 mg/dL).   Assessment: 76 yo M on warfarin pta for mechanical AVR and afib, admitted with subtherapeutic INR, likely from noncompliance with medications due to AMS.    He was started on a heparin bridge. Patient given higher 5mg  doses (for 5 days). INR doubled to 4.49 and heparin was stopped. INR was stable at 5.47 yesterday (5.59 9/12). Vitamin K 2.5 mg PO given 9/13.  INR therapeutic today s/p 5mg  dose given 9/15 AM, CBC remains stable and no reports of bleeding.  PTA dose warfarin: 1.5 mg TuThSat and 3mg  all other days (per [facility] visit on 8/7)  Goal of Therapy:  INR 2.5-3.5 for mechanical AVR + Afib Monitor platelets by anticoagulation protocol: Yes   Plan:   Warfarin 3mg  PO x1 @1800  Monitor daily INR, CBC, clinical course, s/sx of bleed, PO intake, DDI  Bertis Ruddy, PharmD Pharmacy Resident Pager #: [phone] 11/22/2016 8:39 AM

## 2016-11-23 DIAGNOSIS — E440 Moderate protein-calorie malnutrition: Secondary | ICD-10-CM

## 2016-11-23 DIAGNOSIS — N390 Urinary tract infection, site not specified: Secondary | ICD-10-CM

## 2016-11-23 LAB — CBC
HCT: 48.5 % (ref 39.0–52.0)
HEMOGLOBIN: 15.9 g/dL (ref 13.0–17.0)
MCH: 30.6 pg (ref 26.0–34.0)
MCHC: 32.8 g/dL (ref 30.0–36.0)
MCV: 93.4 fL (ref 78.0–100.0)
Platelets: 153 10*3/uL (ref 150–400)
RBC: 5.19 MIL/uL (ref 4.22–5.81)
RDW: 13.5 % (ref 11.5–15.5)
WBC: 7.2 10*3/uL (ref 4.0–10.5)

## 2016-11-23 LAB — BASIC METABOLIC PANEL
ANION GAP: 5 (ref 5–15)
BUN: 29 mg/dL — ABNORMAL HIGH (ref 6–20)
CALCIUM: 9.4 mg/dL (ref 8.9–10.3)
CO2: 31 mmol/L (ref 22–32)
Chloride: 111 mmol/L (ref 101–111)
Creatinine, Ser: 0.79 mg/dL (ref 0.61–1.24)
GFR calc non Af Amer: >60 mL/min (ref >60)
GLUCOSE: 108 mg/dL — AB (ref 65–99)
Potassium: 3.6 mmol/L (ref 3.5–5.1)
Sodium: 147 mmol/L — ABNORMAL HIGH (ref 135–145)

## 2016-11-23 LAB — PROTIME-INR
INR: 2.92
PROTHROMBIN TIME: 30.3 s — AB (ref 11.4–15.2)

## 2016-11-23 MED ORDER — WARFARIN SODIUM 1 MG PO TABS
1.5000 mg | ORAL_TABLET | Freq: Once | ORAL | Status: AC
  Administered 2016-11-23: 1.5 mg via ORAL
  Filled 2016-11-23: qty 1

## 2016-11-23 MED ORDER — TAMSULOSIN HCL 0.4 MG PO CAPS
0.8000 mg | ORAL_CAPSULE | Freq: Every day | ORAL | Status: DC
  Administered 2016-11-23 – 2016-12-02 (×10): 0.8 mg via ORAL
  Filled 2016-11-23 (×11): qty 2

## 2016-11-23 NOTE — Progress Notes (Addendum)
Family Medicine Teaching Service Daily Progress Note Intern Pager: [phone]  Patient name: Juan Wilkerson Medical record number: [id number] Date of birth: [birth year] Age: 76 y.o. Gender: male  Primary Care Provider: Lovenia Kim, MD Consultants: Psychiatry Code Status: FULL CODE  Pt Overview and Major Events to Date:  9/5 Admitted for AMS 9/6 Psychiatric assessment recommended inpatient placement (Geri-psych)  Assessment and Plan: Juan Wilkerson is a 76 y.o. male presenting with altered mental status. PMH is significant for atrial flutter/fibrillation on warfarin, hypertension, hyperlipidemia, Marfan's syndrome, Aortic valve replacement, microscopic hematuria, left renal mass  UTI in setting of resolved sepsis: Afebrile overnight. Urine culture + Klebsiella and Citrobacter. Blood culture NGTD x 3 days. s/p Vanc (9/11-9/12), Cefepime (9/11-9/13) - Cefixime started (9/14-) for 10 day treatment, end on (9/20)  Altered mental status 2/2 suspected Lewy Body Dementia: Stable. Alert and oriented to self and place with confabulation. Has tolerated partial restraint removal without significant behavioral problems.  - Soft restraints, mittens, if agitated - Avoid haldol (anti-dopaminergic in setting of suspected LBD) - Psych re-evaluated, appreciate recs. Geodon 10mg  IM qhs prn agitation and aggression only. No more than 2 shots in 24 hours. - Awaiting inpatient Geri-psych placement  Anticoagulation for Mechanical aortic valve: INR at goal 2.92 on 09/17. Goal range 2.5-3.5.  - Coumadin per pharmacy - monitor INR  Acute Urinary Retention: failed voiding trial, foley replaced 9/14, attempt voiding trial after increase in flomax - monitor I/O - increase flomax to 0.8 mg daily- first dose today prior to voiding trial attempt tomorrow - continue finasteride  Constipation: last BM 9/11, underwent manual disimpaction on 9/16 - continue scheduled miralax, senna-docusate  - dulcolax suppository on  9/16 - monitor output  Atrial flutter/fibrillation without RVR: Rate controlled on metoprolol.  - Anticoagulation as above. - Metoprolol 12.5 mg BID  Stage 1 sacral ulcer: Staff aware  - Monitor and continue to rotate in bed to relieve pressure of area  Hypokalemia: K 3.6 this am  Hypertension: Normotensive - continue metoprolol and home norvasc   Hyperlipidemia: On pravastatin at home. ASCVD 61yr risk 29.5% based on lipid panel from 11/12/16. - continue atorvastatin   Resolved L shoulder/ trapezius pain: Noted stable cyst that 1 in by 1 in on mid left back, mobile and most likely benign - tylenol prn for pain  Resolved AKI: Peak Cr 1.51 likely pre-renal from dehydration. SCr 0.79 on 09/17 - Monitor Cr  FEN/GI: Heart healthy diet VTE Prophylaxis: Coumadin  Disposition: Medically stable pending Geri-psych placement  Subjective:   States feels well this morning, no concerns.  Denies pain.   Objective: Temp:  [97.8 F (36.6 C)-98.3 F (36.8 C)] 98.2 F (36.8 C) (09/17 0530) Pulse Rate:  [65-75] 67 (09/17 1109) Resp:  [17-20] 20 (09/17 0530) BP: (104-158)/(68-92) 121/78 (09/17 1109) SpO2:  [95 %-100 %] 100 % (09/16 2307) Physical Exam: General: NAD, sitting up comfortably in bed, awake and alert HEENT: moist mucous membranes Cardiovascular: Irregular rhythm, normal rate, loud S2 Respiratory: CTAB, normal WOB Abdomen: soft, nontender, nondistended, + bowel sounds. Rectal: hard stool in rectal vault, manually disimpacted with soft brown stool remaining Extremities: no LE edema, soft mitts in place on hands b/l. Neuro: Alert and oriented to person only. Not oriented to place or time. Psych: speech with confabulation. No agitation.   Laboratory:  Recent Labs Lab 11/21/16 0530 11/22/16 0700 11/23/16 0434  WBC 5.7 6.6 7.2  HGB 16.0 15.5 15.9  HCT 49.0 46.2 48.5  PLT 149* 153 153  Recent Labs Lab 11/17/16 1524  11/21/16 0530 11/22/16 0700 11/23/16 0434   NA 138  < > 144 145 147*  K 3.7  < > 3.1* 3.3* 3.6  CL 107  < > 108 112* 111  CO2 21*  < > 29 25 31   BUN 40*  < > 32* 32* 29*  CREATININE 1.51*  < > 0.75 0.75 0.79  CALCIUM 8.8*  < > 9.4 9.2 9.4  PROT 6.1*  --   --   --   --   BILITOT 1.3*  --   --   --   --   ALKPHOS 91  --   --   --   --   ALT 19  --   --   --   --   AST 23  --   --   --   --   GLUCOSE 132*  < > 122* 112* 108*  < > = values in this interval not displayed. INR: 2.25  Imaging/Diagnostic Tests: MRI 09/10: No acute intracranial abnormality. Small remote right posterior cortical and left cerebellar Infarcts. Mild generalized cerebral atrophy with nonspecific cerebral white matter changes, mildly progressed relative to most recent MRI from 2015.  Chandler Stofer, [place], DO 11/23/2016, 11:59 AM PGY-1, [place] Intern pager: [phone], text pages welcome

## 2016-11-23 NOTE — Progress Notes (Signed)
Physical Therapy Treatment Patient Details Name: Juan Wilkerson MRN: [id number] DOB: 1940-09-07 Today's Date: 11/23/2016    History of Present Illness Juan Wilkerson is a 76 y.o. male presenting with altered mental status. PMH is significant for atrial flutter/fibrillation on warfarin, hypertension, hyperlipidemia, ?Marfan's syndrome, Aortic valve replacement, microscopic hematuria, left renal mass    PT Comments    Much improved today.  With minimal asisst and v/t directional cues, pt able to get to EOB, stand in STEDY and RW and ambulate in the halls with relative ease as compared to recent treatments.    Follow Up Recommendations  SNF     Equipment Recommendations  None recommended by PT    Recommendations for Other Services       Precautions / Restrictions Precautions Precautions: Fall    Mobility  Bed Mobility Overal bed mobility: Needs Assistance Bed Mobility: Rolling;Sidelying to Sit Rolling: Min assist Sidelying to sit: Min assist       General bed mobility comments: mostly min assist and directional cues.  pt scooted to EOB with min tactile cues.  Transfers Overall transfer level: Needs assistance Equipment used: Rolling walker (2 wheeled) (STEDY) Transfers: Sit to/from Stand Sit to Stand: Min assist         General transfer comment: min assist with STEDY, only needing directional cues, min assist and 2 person for safety for RW.  Ambulation/Gait Ambulation/Gait assistance: Min assist;+2 safety/equipment Ambulation Distance (Feet): 180 Feet Assistive device: Rolling walker (2 wheeled) Gait Pattern/deviations: Step-through pattern Gait velocity: slower Gait velocity interpretation: Below normal speed for age/gender General Gait Details: flexed posture, need for assist maneuvering the RW, minor stability assist.  mostly needed directional (v/t) cues to stay on task and redirection on occasion.   Stairs            Wheelchair Mobility    Modified  Rankin (Stroke Patients Only)       Balance Overall balance assessment: Needs assistance   Sitting balance-Leahy Scale: Fair     Standing balance support: Bilateral upper extremity supported Standing balance-Leahy Scale: Poor Standing balance comment: stood in STEDY with little UE assist on the grab bar.  pt marched in place, sat this stood again with mostly direction al cues, then pt did similar in the RW before going for a walk.                            Cognition Arousal/Alertness: Awake/alert Behavior During Therapy: WFL for tasks assessed/performed Overall Cognitive Status: No family/caregiver present to determine baseline cognitive functioning (very impaired, pt needing directional cues to stay on task)                                        Exercises Other Exercises Other Exercises: graded resisted LE flexion extension exercise.    General Comments        Pertinent Vitals/Pain Pain Assessment: Faces Faces Pain Scale: No hurt    Home Living                      Prior Function            PT Goals (current goals can now be found in the care plan section) Acute Rehab PT Goals Patient Stated Goal: none stated PT Goal Formulation: Patient unable to participate in goal setting Time For  Goal Achievement: 11/27/16 Potential to Achieve Goals: Fair Progress towards PT goals: Progressing toward goals    Frequency    Min 2X/week      PT Plan      Co-evaluation              AM-PAC PT "6 Clicks" Daily Activity  Outcome Measure  Difficulty turning over in bed (including adjusting bedclothes, sheets and blankets)?: Unable Difficulty moving from lying on back to sitting on the side of the bed? : Unable Difficulty sitting down on and standing up from a chair with arms (e.g., wheelchair, bedside commode, etc,.)?: Unable Help needed moving to and from a bed to chair (including a wheelchair)?: A Little Help needed walking  in hospital room?: A Little Help needed climbing 3-5 steps with a railing? : A Lot 6 Click Score: 11    End of Session   Activity Tolerance: Patient tolerated treatment well Patient left: in bed;with call bell/phone within reach;with bed alarm set Nurse Communication: Mobility status PT Visit Diagnosis: Unsteadiness on feet (R26.81);Other abnormalities of gait and mobility (R26.89)     Time: 1062-6948 PT Time Calculation (min) (ACUTE ONLY): 28 min  Charges:  $Gait Training: 8-22 mins $Therapeutic Activity: 8-22 mins                    G Codes:       12/15/2016  Donnella Sham, PT [phone]  (pager)   Juan Wilkerson 2016/12/15, 4:15 PM

## 2016-11-23 NOTE — Care Management Important Message (Signed)
Important Message  Patient Details  Name: Juan Wilkerson MRN: [id number] Date of Birth: [birth year]   Medicare Important Message Given:  Yes    Nathen May 11/23/2016, 11:23 AM

## 2016-11-23 NOTE — Progress Notes (Signed)
ANTICOAGULATION CONSULT NOTE - Follow Up Consult  Pharmacy Consult for Warfarin Indication: atrial fibrillation and mechanical AVR   Patient Measurements: Height: 6\' 3"  (190.5 cm) Weight: 175 lb (79.4 kg) IBW/kg (Calculated) : 84.5 Heparin Dosing Weight: 79.4 kg  Vital Signs: Temp: 98.2 F (36.8 C) (09/17 0530) Temp Source: Oral (09/17 0530) BP: 121/78 (09/17 1109) Pulse Rate: 67 (09/17 1109)  Labs:  Recent Labs  11/21/16 0530 11/22/16 0700 11/23/16 0434  HGB 16.0 15.5 15.9  HCT 49.0 46.2 48.5  PLT 149* 153 153  LABPROT 22.9* 28.0* 30.3*  INR 2.04 2.64 2.92  CREATININE 0.75 0.75 0.79    Estimated Creatinine Clearance: 86.8 mL/min (by C-G formula based on SCr of 0.79 mg/dL).   Assessment: 76 yo M on warfarin pta for mechanical AVR and afib, admitted with subtherapeutic INR, likely from noncompliance with medications due to AMS.    He was started on a heparin bridge. Patient given higher 5mg  doses (for 5 days). INR doubled to 4.49 and heparin was stopped.  Vitamin K 2.5 mg PO given 9/13. Missed dose 9/14.  INR therapeutic today but trending up.  PTA dose warfarin: 1.5 mg TuThSat and 3mg  all other days (per [facility] visit on 8/7)  Goal of Therapy:  INR 2.5-3.5 for mechanical AVR + Afib Monitor platelets by anticoagulation protocol: Yes   Plan:   Warfarin 1.5 mg PO x1 @1800  Monitor daily INR, CBC, clinical course, s/sx of bleed, PO intake, DDI  [employer], Pharm.D., BCPS Clinical Pharmacist Pager: [phone] Clinical phone for 11/23/2016 from 8:30-4:00 is [phone]. After 4pm, please call Main Rx [phone]) for assistance. 11/23/2016 2:58 PM

## 2016-11-24 LAB — CBC
HCT: 49.9 % (ref 39.0–52.0)
HEMOGLOBIN: 16.4 g/dL (ref 13.0–17.0)
MCH: 30.4 pg (ref 26.0–34.0)
MCHC: 32.9 g/dL (ref 30.0–36.0)
MCV: 92.6 fL (ref 78.0–100.0)
Platelets: 155 10*3/uL (ref 150–400)
RBC: 5.39 MIL/uL (ref 4.22–5.81)
RDW: 13.4 % (ref 11.5–15.5)
WBC: 6.9 10*3/uL (ref 4.0–10.5)

## 2016-11-24 LAB — BASIC METABOLIC PANEL
Anion gap: 6 (ref 5–15)
BUN: 28 mg/dL — AB (ref 6–20)
CHLORIDE: 110 mmol/L (ref 101–111)
CO2: 28 mmol/L (ref 22–32)
Calcium: 9.3 mg/dL (ref 8.9–10.3)
Creatinine, Ser: 0.83 mg/dL (ref 0.61–1.24)
GFR calc Af Amer: >60 mL/min (ref >60)
GFR calc non Af Amer: >60 mL/min (ref >60)
Glucose, Bld: 108 mg/dL — ABNORMAL HIGH (ref 65–99)
POTASSIUM: 3.8 mmol/L (ref 3.5–5.1)
Sodium: 144 mmol/L (ref 135–145)

## 2016-11-24 LAB — PROTIME-INR
INR: 3.64
PROTHROMBIN TIME: 36 s — AB (ref 11.4–15.2)

## 2016-11-24 NOTE — Progress Notes (Signed)
Family Medicine Teaching Service Daily Progress Note Intern Pager: [phone]  Patient name: Juan Wilkerson Medical record number: [id number] Date of birth: Jan 21, 1941 Age: 76 y.o. Gender: male  Primary Care Provider: Lovenia Kim, MD Consultants: Psychiatry Code Status: FULL   Pt Overview and Major Events to Date:  9/5 Admitted for AMS 9/6 Psychiatric assessment recommended inpatient placement ([facility]-psych)  Assessment and Plan: Juan Wilkerson is a 76 y.o. male presenting with altered mental status. PMH is significant for atrial flutter/fibrillation on warfarin, hypertension, hyperlipidemia, Marfan's syndrome, Aortic valve replacement, microscopic hematuria, left renal mass  UTI in setting of resolved sepsis: Afebrile overnight. Urine culture + Klebsiella and Citrobacter. Blood culture NGTD x 3 days. s/p Vanc (9/11-9/12), Cefepime (9/11-9/13) - Cefixime started (9/14-) for 10 day treatment, end on (9/20)  Altered mental status 2/2 suspected Lewy Body Dementia: Stable. Alert and oriented to self and place with confabulation. Has tolerated partial restraint removal without significant behavioral problems.  - Soft restraints, mittens, if agitated - Avoid haldol (anti-dopaminergic in setting of suspected LBD) - Psych re-evaluated, appreciate recs. Geodon 10mg  IM qhs prn agitation and aggression only. No more than 2 shots in 24 hours. - Awaiting inpatient Geri-psych placement  Anticoagulation for Mechanical aortic valve: INR supratherapeutic 3.64 on 09/18. Goal range 2.5-3.5.  - Coumadin per pharmacy - monitor INR  Acute Urinary Retention: failed voiding trial, foley replaced 9/14, attempt voiding trial after increase in flomax - monitor I/O -flomax 0.8 mg daily  - continue finasteride - voiding trial today  Constipation: last BM 9/11, underwent manual disimpaction on 9/16 - continue scheduled miralax, senna-docusate  - dulcolax suppository on 9/16 - monitor output  Atrial  flutter/fibrillation without RVR: Rate controlled on metoprolol.  - Anticoagulation as above. - Metoprolol 12.5 mg BID  Stage 1 sacral ulcer: Staff aware  - Monitor and continue to rotate in bed to relieve pressure of area  Hypokalemia: K 3.8 this am - monitor  Hypertension: Normotensive - continue metoprolol and home norvasc   Hyperlipidemia: On pravastatin at home. ASCVD 3yr risk 29.5% based on lipid panel from 11/12/16. - continue atorvastatin   Resolved L shoulder/ trapezius pain: Noted stable cyst that 1 in by 1 in on mid left back, mobile and most likely benign - tylenol prn for pain  Resolved AKI: Peak Cr 1.51 likely pre-renal from dehydration. SCr 0.83 on 09/17 - Monitor Cr  FEN/GI: Heart healthy diet VTE Prophylaxis: Coumadin  Disposition: Medically stable pending Geri-psych placement  Subjective:   States feels well this morning, no concerns.  Denies pain.   Objective: Temp:  [97.9 F (36.6 C)-98.3 F (36.8 C)] 97.9 F (36.6 C) (09/17 2101) Pulse Rate:  [61-67] 61 (09/17 2101) Resp:  [18] 18 (09/17 2101) BP: (109-128)/(69-78) 109/71 (09/17 2101) SpO2:  [97 %-99 %] 99 % (09/17 2101)  Physical Exam: General: NAD, sitting up comfortably in bed, awake and alert HEENT: moist mucous membranes Cardiovascular: Irregular rhythm, normal rate, loud S2 Respiratory: CTAB, normal WOB Abdomen: soft, nontender, nondistended, + bowel sounds. Extremities: no LE edema. Neuro: Alert and oriented to person only. Not oriented to place or time. Psych: speech with confabulation. No agitation.   Laboratory:  Recent Labs Lab 11/22/16 0700 11/23/16 0434 11/24/16 0633  WBC 6.6 7.2 6.9  HGB 15.5 15.9 16.4  HCT 46.2 48.5 49.9  PLT 153 153 155    Recent Labs Lab 11/17/16 1524  11/22/16 0700 11/23/16 0434 11/24/16 0633  NA 138  < > 145 147* 144  K 3.7  < > 3.3* 3.6 3.8  CL 107  < > 112* 111 110  CO2 21*  < > 25 31 28   BUN 40*  < > 32* 29* 28*  CREATININE 1.51*  <  > 0.75 0.79 0.83  CALCIUM 8.8*  < > 9.2 9.4 9.3  PROT 6.1*  --   --   --   --   BILITOT 1.3*  --   --   --   --   ALKPHOS 91  --   --   --   --   ALT 19  --   --   --   --   AST 23  --   --   --   --   GLUCOSE 132*  < > 112* 108* 108*  < > = values in this interval not displayed. INR: 2.25  Imaging/Diagnostic Tests: MRI 09/10: No acute intracranial abnormality. Small remote right posterior cortical and left cerebellar Infarcts. Mild generalized cerebral atrophy with nonspecific cerebral white matter changes, mildly progressed relative to most recent MRI from 2015.  Rory Percy, DO 11/24/2016, 8:08 AM PGY-1, [place] Intern pager: [phone], text pages welcome

## 2016-11-24 NOTE — Progress Notes (Signed)
Paged MD about when to in and out cath pt. Had 240 in bladder when bladder scan completed at 4:30. Will continue to monitor.

## 2016-11-24 NOTE — Progress Notes (Signed)
ANTICOAGULATION CONSULT NOTE - Follow Up Consult  Pharmacy Consult for Warfarin Indication: atrial fibrillation and mechanical AVR   Patient Measurements: Height: 6\' 3"  (190.5 cm) Weight: 175 lb (79.4 kg) IBW/kg (Calculated) : 84.5 Heparin Dosing Weight: 79.4 kg  Vital Signs: Temp: 98.5 F (36.9 C) (09/18 1611) Temp Source: Oral (09/18 1611) BP: 128/92 (09/18 1611) Pulse Rate: 91 (09/18 1611)  Labs:  Recent Labs  11/22/16 0700 11/23/16 0434 11/24/16 0633  HGB 15.5 15.9 16.4  HCT 46.2 48.5 49.9  PLT 153 153 155  LABPROT 28.0* 30.3* 36.0*  INR 2.64 2.92 3.64  CREATININE 0.75 0.79 0.83    Estimated Creatinine Clearance: 83.7 mL/min (by C-G formula based on SCr of 0.83 mg/dL).   Assessment: 76 yo M on warfarin pta for mechanical AVR and afib, admitted with subtherapeutic INR, likely from noncompliance with medications due to AMS.    He was started on a heparin bridge. Patient given higher 5mg  doses (for 5 days). INR doubled to 4.49 and heparin was stopped.  Vitamin K 2.5 mg PO given 9/13. Missed dose 9/14.  INR slightly above goal today.  Will hold dose.  No bleeding noted.   PTA dose warfarin: 1.5 mg TuThSat and 3mg  all other days (per [facility] visit on 8/7)  Goal of Therapy:  INR 2.5-3.5 for mechanical AVR + Afib Monitor platelets by anticoagulation protocol: Yes   Plan:   No Warfarin tonight. Monitor daily INR, CBC, clinical course, s/sx of bleed, PO intake, DDI  [employer], Pharm.D., BCPS Clinical Pharmacist Pager: [phone] Clinical phone for 11/24/2016 from 8:30-4:00 is [phone]. After 4pm, please call Main Rx ([phone]) for assistance. 11/24/2016 4:29 PM

## 2016-11-24 NOTE — NC FL2 (Signed)
[place] LEVEL OF CARE SCREENING TOOL     IDENTIFICATION  Patient Name: Juan Wilkerson Birthdate: 1940/05/11 Sex: male Admission Date (Current Location): 11/11/2016  [facility] and [place] Number:  Herbalist and Address:  The . [facility], [place] [address], [place], [place] [address]      Provider Number: [id number]  Attending Physician Name and Address:  Dickie La, MD  Relative Name and Phone Number:  Shanon Brow, brother lives out of state, [phone]    Current Level of Care: Hospital Recommended Level of Care: SNF Prior Approval Number:    Date Approved/Denied:   PASRR Number:     Discharge Plan: SNF    Current Diagnoses: Patient Active Problem List   Diagnosis Date Noted  . Abdominal distention   . Urinary tract infection without hematuria   . Malnutrition of moderate degree 11/18/2016  . S/P AVR (aortic valve replacement)   . Chronic anticoagulation   . Lewy body dementia with behavioral disturbance   . Hallucinations   . Pressure injury of skin 11/12/2016  . Altered mental status 11/11/2016  . Constipation 09/12/2015  . Upper airway cough syndrome 04/16/2015  . Left renal mass   . Renal oncocytoma of left kidney   . Traumatic ecchymosis of multiple sites 12/03/2014  . Easy bruising 12/03/2014  . Arthritis of knee, left 12/15/2013  . Colon cancer screening 08/28/2013  . Word finding difficulty 08/28/2013  . Microscopic hematuria 11/29/2011  . Long term (current) use of anticoagulants 04/19/2010  . AORTIC VALVE REPLACEMENT, HX OF 01/24/2010  . ATRIAL FLUTTER 07/15/2009  . ESSENTIAL HYPERTENSION, BENIGN 04/29/2009  . Atrial fibrillation ([place]) 04/29/2009  . 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 Degenerative arthritis of knee, bilateral 04/10/2009  . MARFAN'S SYNDROME 04/18/2007  . HYPERCHOLESTEROLEMIA 07/22/2006  . DEGENERATIVE JOINT DISEASE, BOTH KNEES, SEVERE 07/22/2006    Orientation RESPIRATION BLADDER  Height & Weight     Self  Normal Incontinent (Foley) Weight: 79.4 kg (175 lb) Height:  6\' 3"  (190.5 cm)  BEHAVIORAL SYMPTOMS/MOOD NEUROLOGICAL BOWEL NUTRITION STATUS      Incontinent Diet (Please see DC Summary)  AMBULATORY STATUS COMMUNICATION OF NEEDS Skin   Limited Assist Verbally  (Deep tissue injury on coccyx)                       Personal Care Assistance Level of Assistance  Bathing, Feeding, Dressing Bathing Assistance: Maximum assistance Feeding assistance: Limited assistance Dressing Assistance: Limited assistance     Functional Limitations Info             SPECIAL CARE FACTORS FREQUENCY  PT (By licensed PT)  OT     PT Frequency: 5x/week  OT Frequency 3x/week            Contractures      Additional Factors Info  Code Status, Allergies, Psychotropic Code Status Info: Full Allergies Info: Lemon Oil Psychotropic Info: Ativan         Current Medications (11/24/2016):  This is the current hospital active medication list Current Facility-Administered Medications  Medication Dose Route Frequency Provider Last Rate Last Dose  . acetaminophen (TYLENOL) tablet 650 mg  650 mg Oral Q6H PRN Shirley, [place], DO   650 mg at 11/24/16 0012  . amLODipine (NORVASC) tablet 10 mg  10 mg Oral Daily Wendee Beavers T, MD   10 mg at 11/24/16 0850  . atorvastatin (LIPITOR) tablet 40 mg  40 mg Oral  [id number] [name], DO   40 mg at 11/23/16 1817  . Cefixime (SUPRAX) capsule 400 mg  400 mg Oral Daily [name], MD   Stopped at 11/24/16 1123  . feeding supplement (PRO-STAT SUGAR FREE 64) liquid 30 mL  30 mL Oral TID BM Dickie La, MD   30 mL at 11/24/16 0851  . finasteride (PROSCAR) tablet 5 mg  5 mg Oral Daily Orson Eva J, DO   Stopped at 11/24/16 1123  . LORazepam (ATIVAN) injection 0.5 mg  0.5 mg Intravenous Once [name], MD      . MEDLINE mouth rinse  15 mL Mouth Rinse BID Dickie La, MD   15 mL at 11/23/16 2220  . metoprolol tartrate (LOPRESSOR)  tablet 12.5 mg  12.5 mg Oral BID Shirley, [place], DO   12.5 mg at 11/24/16 0851  . multivitamin with minerals tablet 1 tablet  1 tablet Oral Daily Dickie La, MD   1 tablet at 11/24/16 0851  . polyethylene glycol (MIRALAX / GLYCOLAX) packet 17 g  17 g Oral Daily Enid Derry, [place], DO   17 g at 11/24/16 0851  . senna-docusate (Senokot-S) tablet 1 tablet  1 tablet Oral BID [name], DO   1 tablet at 11/24/16 0850  . tamsulosin (FLOMAX) capsule 0.8 mg  0.8 mg Oral Daily Enid Derry, [place], DO   0.8 mg at 11/24/16 0850  . Warfarin - Pharmacist Dosing Inpatient   Does not apply q1800 Dickie La, MD         Discharge Medications: Please see discharge summary for a list of discharge medications.  Relevant Imaging Results:  Relevant Lab Results:   Additional Information SSN: Jessie [place], [place]

## 2016-11-25 LAB — BASIC METABOLIC PANEL
ANION GAP: 10 (ref 5–15)
BUN: 28 mg/dL — ABNORMAL HIGH (ref 6–20)
CHLORIDE: 107 mmol/L (ref 101–111)
CO2: 25 mmol/L (ref 22–32)
Calcium: 9 mg/dL (ref 8.9–10.3)
Creatinine, Ser: 0.82 mg/dL (ref 0.61–1.24)
GFR calc non Af Amer: >60 mL/min (ref >60)
Glucose, Bld: 94 mg/dL (ref 65–99)
POTASSIUM: 4.1 mmol/L (ref 3.5–5.1)
Sodium: 142 mmol/L (ref 135–145)

## 2016-11-25 LAB — CBC
HCT: 47.6 % (ref 39.0–52.0)
Hemoglobin: 15.1 g/dL (ref 13.0–17.0)
MCH: 29.4 pg (ref 26.0–34.0)
MCHC: 31.7 g/dL (ref 30.0–36.0)
MCV: 92.6 fL (ref 78.0–100.0)
Platelets: 146 10*3/uL — ABNORMAL LOW (ref 150–400)
RBC: 5.14 MIL/uL (ref 4.22–5.81)
RDW: 13.4 % (ref 11.5–15.5)
WBC: 6.8 10*3/uL (ref 4.0–10.5)

## 2016-11-25 LAB — PROTIME-INR
INR: 3.62
PROTHROMBIN TIME: 35.8 s — AB (ref 11.4–15.2)

## 2016-11-25 NOTE — Progress Notes (Signed)
ANTICOAGULATION CONSULT NOTE - Follow Up Consult  Pharmacy Consult for Warfarin Indication: atrial fibrillation and mechanical AVR   Patient Measurements: Height: 6\' 3"  (190.5 cm) Weight: 175 lb (79.4 kg) IBW/kg (Calculated) : 84.5 Heparin Dosing Weight: 79.4 kg  Vital Signs: Temp: 97.7 F (36.5 C) (09/19 0623) Temp Source: Oral (09/19 0623) BP: 107/69 (09/19 0623) Pulse Rate: 60 (09/19 0623)  Labs:  Recent Labs  11/23/16 0434 11/24/16 0633 11/25/16 0455  HGB 15.9 16.4 15.1  HCT 48.5 49.9 47.6  PLT 153 155 146*  LABPROT 30.3* 36.0* 35.8*  INR 2.92 3.64 3.62  CREATININE 0.79 0.83 0.82    Estimated Creatinine Clearance: 84.7 mL/min (by C-G formula based on SCr of 0.82 mg/dL).   Assessment: 76 yo M on warfarin pta for mechanical AVR and afib, admitted with subtherapeutic INR, likely from noncompliance with medications due to AMS.    He was started on a heparin bridge. Patient given higher 5mg  doses (for 5 days). INR doubled to 4.49 and heparin was stopped.  Vitamin K 2.5 mg PO given 9/13. Missed dose 9/14.  INR slightly high today at 3.62  PTA dose warfarin: 1.5 mg TuThSat and 3mg  all other days (per [facility] visit on 8/7)  Goal of Therapy:  INR 2.5-3.5 for mechanical AVR + Afib Monitor platelets by anticoagulation protocol: Yes   Plan:   Hold warfarin tonight Monitor daily INR, CBC, clinical course, s/sx of bleed, PO intake, DDI  Thank you Anette Guarneri, PharmD [phone] 11/25/2016 9:51 AM

## 2016-11-25 NOTE — Progress Notes (Addendum)
9/20: Staffed case with Medical Director. He would like for patient to be sent to SNF initially so his insurance would pay for at least short term and then family can figure out more permanent placement. CSW sent referrals.   9/19:Memory care RN Liaison came to assess patient this morning. She reported that memory care/ALF is unable to accept patients with foleys. MD aware. CSW believes patient not appropriate for SNF due to insurance not approving him for more than a week or two. Patient requires more permanent placement as he is currently unable to live alone. CSW left message for patient's brother to inquire about potential Guardianship of patient.   Percell Locus Verlon Carcione LCSWA [phone]

## 2016-11-25 NOTE — Progress Notes (Signed)
Occupational Therapy Treatment Patient Details Name: Juan Wilkerson MRN: [id number] DOB: [birth year] Today's Date: 11/25/2016    History of present illness Juan Wilkerson is a 76 y.o. male presenting with altered mental status. PMH is significant for atrial flutter/fibrillation on warfarin, hypertension, hyperlipidemia, ?Marfan's syndrome, Aortic valve replacement, microscopic hematuria, left renal mass   OT comments  Pt making progress towards OT goals this session. Pt remains confused and with decreased cognition. Pt able to complete transfers to [facility] with max A for rear peri care and transfers to chair. Pt continues to benefit from skilled OT in the acute setting prior to dc to below venue.   Follow Up Recommendations  SNF;Supervision/Assistance - 24 hour    Equipment Recommendations  Other (comment) (defer to next venue)    Recommendations for Other Services      Precautions / Restrictions Precautions Precautions: Fall Restrictions Weight Bearing Restrictions: No       Mobility Bed Mobility Overal bed mobility: Needs Assistance Bed Mobility: Rolling;Sidelying to Sit Rolling: Min assist Sidelying to sit: Min assist       General bed mobility comments: mostly min assist and directional cues.  pt scooted to EOB with min tactile cues.  Transfers Overall transfer level: Needs assistance Equipment used: 1 person hand held assist Transfers: Sit to/from [facility] Sit to Stand: Min assist Stand pivot transfers: Min assist;+2 safety/equipment       General transfer comment: Pt anxious with transfers, requiring calming words and vc for sequencing    Balance Overall balance assessment: Needs assistance Sitting-balance support: Feet supported;Single extremity supported Sitting balance-Leahy Scale: Fair Sitting balance - Comments: able to sit EOB min guard this session   Standing balance support: Bilateral upper extremity supported Standing balance-Leahy Scale:  Poor Standing balance comment: reliant on external support from therapist for upright                           ADL either performed or assessed with clinical judgement   ADL Overall ADL's : Needs assistance/impaired                         Toilet Transfer: Minimal assistance;+2 for safety/equipment;Stand-pivot;BSC;Cueing for sequencing Toilet Transfer Details (indicate cue type and reason): Pt anxious mid-transfer, required face to face removal of the RW Toileting- Clothing Manipulation and Hygiene: Sit to/from stand;+2 for safety/equipment;Maximal assistance Toileting - Clothing Manipulation Details (indicate cue type and reason): stand with steading from therapist, peri care provided by RN - cleaning and barrier cream     Functional mobility during ADLs: Minimal assistance;+2 for safety/equipment       Vision       Perception     Praxis      Cognition Arousal/Alertness: Awake/alert Behavior During Therapy: Anxious Overall Cognitive Status: No family/caregiver present to determine baseline cognitive functioning                                 General Comments: Pt often making comments not related to situation, needing constant reassurance - unaware of place        Exercises     Shoulder Instructions       General Comments RN helping throughout session, evaluating bottom for sores    Pertinent Vitals/ Pain       Pain Assessment: No/denies pain Pain Intervention(s): Monitored during session;Repositioned  Home  Living                                          Prior Functioning/Environment              Frequency  Min 1X/week        Progress Toward Goals  OT Goals(current goals can now be found in the care plan section)  Progress towards OT goals: Progressing toward goals  Acute Rehab OT Goals Patient Stated Goal: none stated OT Goal Formulation: Patient unable to participate in goal setting Time  For Goal Achievement: 11/27/16 Potential to Achieve Goals: [place] Discharge plan remains appropriate    Co-evaluation                 AM-PAC PT "6 Clicks" Daily Activity     Outcome Measure   Help from another person eating meals?: A Lot Help from another person taking care of personal grooming?: Total Help from another person toileting, which includes using toliet, bedpan, or urinal?: Total Help from another person bathing (including washing, rinsing, drying)?: Total Help from another person to put on and taking off regular upper body clothing?: Total Help from another person to put on and taking off regular lower body clothing?: Total 6 Click Score: 7    End of Session Equipment Utilized During Treatment: Gait belt  OT Visit Diagnosis: Muscle weakness (generalized) (M62.81);Other symptoms and signs involving cognitive function;Cognitive communication deficit (R41.841)   Activity Tolerance Patient tolerated treatment well   Patient Left in chair;with call bell/phone within reach;with chair alarm set;with restraints reapplied;with nursing/sitter in room (mitts)   Nurse Communication Mobility status;Precautions        Time: 2330-0762 OT Time Calculation (min): 23 min  Charges: OT General Charges $OT Visit: 1 Visit OT Treatments $Self Care/Home Management : 23-37 mins  Hulda Humphrey OTR/L [place] 11/25/2016, 2:57 PM

## 2016-11-25 NOTE — Progress Notes (Signed)
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  Ongoing  GOAL:   Patient will meet greater than or equal to 90% of their needs  Progressing  MONITOR:   PO intake, Supplement acceptance, Labs, Weight trends, Skin, I & O's  REASON FOR ASSESSMENT:   Low Braden    ASSESSMENT:   Juan Wilkerson is a 76 y.o. male presenting with altered mental status. PMH is significant for atrial flutter/fibrillation on warfarin, hypertension, hyperlipidemia, ?Marfan's syndrome, Aortic valve replacement, microscopic hematuria, left renal mass  9/13- transferred from SDU to medical floor  Pt receiving nursing care at time of visit. Noted pt with mittens on. Pt is receiving feeding assistance from staff. Meal completion has increased with improvement of pt mentation. Noted meal completion 20-80% (averaging about 50% of meals). Pt is taking Prostat and MVI as ordered.   CSW and RNCM following. Plan to discharge to geri-psych facility once bed is available. Per MD notes, pt may also require guardianship for long term SNF placement (pt too medically complex for ALF).   Labs reviewed.   Diet Order:  Diet Heart Room service appropriate? Yes; Fluid consistency: Thin  Skin:  Wound (see comment) (DPTI to coccyx)  Last BM:  11/24/16  Height:   Ht Readings from Last 1 Encounters:  11/11/16 6\' 3"  (1.905 m)    Weight:   Wt Readings from Last 1 Encounters:  11/11/16 175 lb (79.4 kg)    Ideal Body Weight:  89.1 kg  BMI:  Body mass  index is 21.87 kg/m.  Estimated Nutritional Needs:   Kcal:  7915-0569  Protein:  110-125 grams  Fluid:  2.3-2.5 L  EDUCATION NEEDS:   Education needs no appropriate at this time  Miriana Gaertner A. Jimmye Norman, RD, LDN, CDE Pager: [phone] After hours Pager: [phone]

## 2016-11-25 NOTE — Progress Notes (Signed)
Family Medicine Teaching Service Daily Progress Note Intern Pager: [phone]  Patient name: Juan Wilkerson Medical record number: [id number] Date of birth: 31-Oct-1940 Age: 76 y.o. Gender: male  Primary Care Provider: Lovenia Kim, MD Consultants: Psychiatry Code Status: FULL   Pt Overview and Major Events to Date:  9/5 Admitted for AMS 9/6 Psychiatric assessment recommended inpatient placement (Geri-psych)  Assessment and Plan: Juan Wilkerson is a 76 y.o. male presenting with altered mental status. 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 Alert and oriented to self and place with confabulation. Has tolerated partial restraint removal without significant behavioral problems. In mittens. - Soft restraints, mittens, if agitated - Avoid haldol (anti-dopaminergic in setting of suspected LBD) - Psych re-evaluated, appreciate recs. Geodon 10mg  IM qhs prn agitation and aggression only. No more than 2 shots in 24 hours. - Awaiting inpatient Geri-psych placement  UTI in setting of resolved sepsis: Afebrile overnight. Urine culture + Klebsiella and Citrobacter. Blood culture NGTD x 3 days. s/p Vanc (9/11-9/12), Cefepime (9/11-9/13) - Cefixime started (9/14-) for 10 day treatment, end on (9/20)  Anticoagulation for Mechanical aortic valve: INR supratherapeutic 3.62 on 09/19. Goal range 2.5-3.5.  - Coumadin per pharmacy - monitor INR  Acute Urinary Retention: failed voiding trial, foley replaced 9/14, attempt voiding trial after increase in flomax - monitor I/O - flomax 0.8 mg daily  - continue finasteride - voiding trial today  Constipation: last BM 9/18, manual disimpaction on 9/16 - continue scheduled miralax, senna-docusate  - dulcolax suppository on 9/16 - monitor output  Atrial  flutter/fibrillation without RVR: Rate controlled on metoprolol.  - Anticoagulation as above. - Metoprolol 12.5 mg BID - D/c Norvasc due to lower blood pressures  Stage 1 sacral ulcer: Staff aware  - Monitor and continue to rotate in bed to relieve pressure of area  Hypokalemia, resolved: K 4.1 this am - monitor  Hypertension, stable: Normotensive - continue metoprolol and home norvasc   Hyperlipidemia: On pravastatin at home. ASCVD 70yr risk 29.5% based on lipid panel from 11/12/16. - continue atorvastatin   Resolved L shoulder/ trapezius pain: Noted stable cyst that 1 in by 1 in on mid left back, mobile and most likely benign - tylenol prn for pain  Resolved AKI: Peak Cr 1.51 likely pre-renal from dehydration. SCr 0.83 on 09/17 - Monitor Cr  FEN/GI: Heart healthy diet VTE Prophylaxis: Coumadin  Disposition: Medically stable for Geri-psych placement  Subjective:   States feels well this morning, no concerns. Denies pain. Eating breakfast.   Spoke with social work about placement.   Objective: Temp:  [97.7 F (36.5 C)-98.5 F (36.9 C)] 97.7 F (36.5 C) (09/19 0623) Pulse Rate:  [56-91] 57 (09/19 1011) Resp:  [14-16] 16 (09/19 0623) BP: (98-128)/(61-92) 98/61 (09/19 1011) SpO2:  [98 %-100 %] 100 % (09/19 1011)  Physical Exam: General: NAD, sitting up comfortably in bed, awake and alert HEENT: moist mucous membranes Cardiovascular: Irregular rhythm, normal rate, loud S2 Respiratory: CTAB, normal WOB Abdomen: soft, nontender, nondistended, + bowel sounds. Extremities: no LE edema. Neuro: Alert and oriented to person and place only. Not oriented to time. Psych: speech with confabulation. No agitation.   Laboratory:  Recent Labs Lab 11/23/16 0434 11/24/16 0633 11/25/16 0455  WBC 7.2 6.9 6.8  HGB 15.9 16.4 15.1  HCT 48.5 49.9 47.6  PLT 153 155 146*    Recent Labs  Lab 11/23/16 0434 11/24/16 0633 11/25/16 0455  NA 147* 144 142  K 3.6 3.8 4.1  CL 111  110 107  CO2 31 28 25   BUN 29* 28* 28*  CREATININE 0.79 0.83 0.82  CALCIUM 9.4 9.3 9.0  GLUCOSE 108* 108* 94   INR: 3.62  Imaging/Diagnostic Tests: MRI 09/10: No acute intracranial abnormality. Small remote right posterior cortical and left cerebellar Infarcts. Mild generalized cerebral atrophy with nonspecific cerebral white matter changes, mildly progressed relative to most recent MRI from 2015.  Juan Wilkerson, [place], DO 11/25/2016, 11:07 AM PGY-1, [place] Intern pager: [phone], text pages welcome

## 2016-11-25 NOTE — Progress Notes (Signed)
   11/25/16 1011  Vitals  BP 98/61  MAP (mmHg) 68  BP Location Right Arm  BP Method Automatic  Patient Position (if appropriate) Sitting  Pulse Rate (!) 57  Pulse Rate Source Dinamap  Oxygen Therapy  SpO2 100 %  O2 Device Room Air  Pain Assessment  Pain Assessment PAINAD  Pain Score 0  family medicine team notified and wants me to hold amlodipine and metoprolol

## 2016-11-25 NOTE — Progress Notes (Signed)
ANTICOAGULATION CONSULT NOTE - Follow Up Consult  Pharmacy Consult for Warfarin Indication: atrial fibrillation and mechanical AVR   Patient Measurements: Height: 6\' 3"  (190.5 cm) Weight: 175 lb (79.4 kg) IBW/kg (Calculated) : 84.5 Heparin Dosing Weight: 79.4 kg  Vital Signs: Temp: 97.7 F (36.5 C) (09/19 0623) Temp Source: Oral (09/19 0623) BP: 107/69 (09/19 0623) Pulse Rate: 60 (09/19 0623)  Labs:  Recent Labs  11/23/16 0434 11/24/16 0633 11/25/16 0455  HGB 15.9 16.4 15.1  HCT 48.5 49.9 47.6  PLT 153 155 146*  LABPROT 30.3* 36.0* 35.8*  INR 2.92 3.64 3.62  CREATININE 0.79 0.83 0.82    Estimated Creatinine Clearance: 84.7 mL/min (by C-G formula based on SCr of 0.82 mg/dL).   Assessment: 76 yo M on warfarin pta for mechanical AVR and afib, admitted with subtherapeutic INR, likely from noncompliance with medications due to AMS.    He was started on a heparin bridge. Patient given higher 5mg  doses (for 5 days). INR doubled to 4.49 and heparin was stopped.  Vitamin K 2.5 mg PO given 9/13. Missed dose 9/14.  INR slightly high today at 3.62  PTA dose warfarin: 1.5 mg TuThSat and 3mg  all other days (per [facility] visit on 8/7)  Goal of Therapy:  INR 2.5-3.5 for mechanical AVR + Afib Monitor platelets by anticoagulation protocol: Yes   Plan:   Hold warfarin tonight Monitor daily INR, CBC, clinical course, s/sx of bleed, PO intake, DDI  Thank you Anette Guarneri, PharmD [phone] 11/25/2016 9:52 AM

## 2016-11-26 LAB — CBC
HEMATOCRIT: 46.3 % (ref 39.0–52.0)
HEMOGLOBIN: 15 g/dL (ref 13.0–17.0)
MCH: 30 pg (ref 26.0–34.0)
MCHC: 32.4 g/dL (ref 30.0–36.0)
MCV: 92.6 fL (ref 78.0–100.0)
Platelets: 156 10*3/uL (ref 150–400)
RBC: 5 MIL/uL (ref 4.22–5.81)
RDW: 13.6 % (ref 11.5–15.5)
WBC: 6.4 10*3/uL (ref 4.0–10.5)

## 2016-11-26 LAB — PROTIME-INR
INR: 3.36
PROTHROMBIN TIME: 33.8 s — AB (ref 11.4–15.2)

## 2016-11-26 MED ORDER — WARFARIN 0.5 MG HALF TABLET
0.5000 mg | ORAL_TABLET | Freq: Once | ORAL | Status: AC
  Administered 2016-11-26: 0.5 mg via ORAL
  Filled 2016-11-26: qty 1

## 2016-11-26 NOTE — Progress Notes (Signed)
Physical Therapy Treatment Patient Details Name: Juan Wilkerson MRN: [id number] DOB: [birth year] Today's Date: 11/26/2016    History of Present Illness Juan Wilkerson is a 76 y.o. male presenting with altered mental status. PMH is significant for atrial flutter/fibrillation on warfarin, hypertension, hyperlipidemia, ?Marfan's syndrome, Aortic valve replacement, microscopic hematuria, left renal mass    PT Comments    Pt progressing slowly physically, but has not made significant changes mentally.  Emphasis continues on transitions, transfers and progressive ambulation with stress on getting pt to follow simple direction and stay on task.    Follow Up Recommendations  SNF     Equipment Recommendations  None recommended by PT    Recommendations for Other Services       Precautions / Restrictions Precautions Precautions: Fall    Mobility  Bed Mobility Overal bed mobility: Needs Assistance Bed Mobility: Supine to Sit     Supine to sit: Min assist     General bed mobility comments: pt attempting to use momentum to come up, but still needed min assist  Transfers Overall transfer level: Needs assistance Equipment used: 1 person hand held assist Transfers: Sit to/from Stand Sit to Stand: Min assist         General transfer comment: steady assist  Ambulation/Gait Ambulation/Gait assistance: Min assist Ambulation Distance (Feet): 130 Feet Assistive device: Rolling walker (2 wheeled) Gait Pattern/deviations: Step-through pattern Gait velocity: slower Gait velocity interpretation: Below normal speed for age/gender General Gait Details: cues for postural checks though pt generally flexed at knees and trunk, worsening with fatigue.  Sats during gait on RA 96%  EHR in the 80's   Stairs            Wheelchair Mobility    Modified Rankin (Stroke Patients Only)       Balance Overall balance assessment: Needs assistance   Sitting balance-Leahy Scale: Fair      Standing balance support: Bilateral upper extremity supported Standing balance-Leahy Scale: Poor (to fair) Standing balance comment: reliant on external support from therapist for upright                            Cognition Arousal/Alertness: Awake/alert Behavior During Therapy: [facility] for tasks assessed/performed;Anxious Overall Cognitive Status: No family/caregiver present to determine baseline cognitive functioning                                        Exercises      General Comments        Pertinent Vitals/Pain Pain Assessment: Faces Faces Pain Scale: Hurts a little bit Pain Location: sternal pain Pain Descriptors / Indicators: Grimacing Pain Intervention(s): Monitored during session    Home Living                      Prior Function            PT Goals (current goals can now be found in the care plan section) Acute Rehab PT Goals PT Goal Formulation: Patient unable to participate in goal setting Time For Goal Achievement: 11/27/16 Potential to Achieve Goals: Fair Progress towards PT goals: Progressing toward goals    Frequency    Min 2X/week      PT Plan Current plan remains appropriate    Co-evaluation              AM-PAC  PT "6 Clicks" Daily Activity  Outcome Measure  Difficulty turning over in bed (including adjusting bedclothes, sheets and blankets)?: Unable Difficulty moving from lying on back to sitting on the side of the bed? : Unable Difficulty sitting down on and standing up from a chair with arms (e.g., wheelchair, bedside commode, etc,.)?: Unable Help needed moving to and from a bed to chair (including a wheelchair)?: A Little Help needed walking in hospital room?: A Little Help needed climbing 3-5 steps with a railing? : A Lot 6 Click Score: 11    End of Session   Activity Tolerance: Patient tolerated treatment well;Patient limited by fatigue Patient left: in chair;with call bell/phone within  reach;with chair alarm set Nurse Communication: Mobility status PT Visit Diagnosis: Unsteadiness on feet (R26.81);Other abnormalities of gait and mobility (R26.89)     Time: 0175-1025 PT Time Calculation (min) (ACUTE ONLY): 20 min  Charges:  $Gait Training: 8-22 mins                    G Codes:       Dec 08, 2016  Donnella Sham, PT [phone]  (pager)   Tessie Fass [name] December 08, 2016, 5:15 PM

## 2016-11-26 NOTE — Progress Notes (Signed)
Family Medicine Teaching Service Daily Progress Note Intern Pager: [phone]  Patient name: Juan Wilkerson Medical record number: [id number] Date of birth: [birth year] Age: 76 y.o. Gender: male  Primary Care Provider: Lovenia Kim, MD Consultants: Psychiatry Code Status: FULL   Pt Overview and Major Events to Date:  9/5 Admitted for AMS 9/6 Psychiatric assessment recommended inpatient placement (Geri-psych)  Assessment and Plan: Juan Wilkerson is a 76 y.o. male presenting with altered mental status. 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 Alert and oriented to self and place with confabulation. Has tolerated partial restraint removal without significant behavioral problems. In mittens. - Soft restraints, mittens, if agitated - Avoid haldol (anti-dopaminergic in setting of suspected LBD) - Psych re-evaluated, appreciate recs. Geodon 10mg  IM qhs prn agitation and aggression only. No more than 2 shots in 24 hours. - Awaiting inpatient Geri-psych placement  UTI in setting of resolved sepsis: Resolved. Urine culture + Klebsiella and Citrobacter. Blood culture negative. s/p Vanc (9/11-9/12), Cefepime (9/11-9/13) - Cefixime started (9/14-) for 10 day treatment, last dose today, 9/20  Anticoagulation for Mechanical aortic valve: INR therapeutic 3.26 on 09/20. Goal range 2.5-3.5.  - Coumadin per pharmacy - monitor INR  Acute Urinary Retention: failed voiding trial, foley replaced 9/14, attempt voiding trial again on 9/21 - monitor I/O - flomax increased to 0.8 mg daily on 9/17 - continue finasteride - voiding trial tomorrow  Constipation, imrpoved: last BM 9/18, manual disimpaction on 9/16 - continue scheduled miralax, senna-docusate  - dulcolax suppository on 9/16 - monitor output  Atrial  flutter/fibrillation without RVR: Rate controlled on metoprolol.  - Anticoagulation as above. - Metoprolol 12.5 mg BID   Stage 1 sacral ulcer: Staff aware  - Monitor and continue to rotate in bed to relieve pressure of area  Hypokalemia, resolved: K 4.1 on 9/19 - monitor  Hypertension, stable: Normotensive - continue metoprolol and stopped Norvasc  Hyperlipidemia: On pravastatin at home. ASCVD 64yr risk 29.5% based on lipid panel from 11/12/16. - continue atorvastatin   Resolved L shoulder/ trapezius pain: Noted stable cyst that 1 in by 1 in on mid left back, mobile and most likely benign - tylenol prn for pain  Resolved AKI: Peak Cr 1.51 likely pre-renal from dehydration. SCr 0.82 on 09/19 - Monitor Cr  FEN/GI: Heart healthy diet VTE Prophylaxis: Coumadin  Disposition: Medically stable for Geri-psych placement or SNF  Subjective:   States feels well this morning, no concerns. Denies pain.   Objective: Temp:  [97.7 F (36.5 C)-98.5 F (36.9 C)] 97.7 F (36.5 C) (09/20 0518) Pulse Rate:  [53-73] 53 (09/20 0518) Resp:  [18] 18 (09/20 0518) BP: (98-119)/(57-68) 119/68 (09/20 0518) SpO2:  [100 %] 100 % (09/20 0518)  Physical Exam: General: NAD, sitting up comfortably in bed, awake and alert HEENT: moist mucous membranes Cardiovascular: Irregular rhythm, normal rate, loud S2 Respiratory: CTAB, normal WOB Abdomen: soft, nontender, nondistended, + bowel sounds. Extremities: no LE edema. Neuro: Alert and oriented to person and place only. Not oriented to time. Psych: speech with confabulation. No agitation.   Laboratory:  Recent Labs Lab 11/24/16 0633 11/25/16 0455 11/26/16 0406  WBC 6.9 6.8 6.4  HGB 16.4 15.1 15.0  HCT 49.9 47.6 46.3  PLT 155 146* 156    Recent Labs Lab 11/23/16 0434 11/24/16 0633 11/25/16 0455  NA 147* 144 142  K 3.6 3.8 4.1  CL 111 110 107  CO2 31 28 25   BUN 29* 28* 28*  CREATININE 0.79 0.83 0.82  CALCIUM 9.4 9.3 9.0  GLUCOSE  108* 108* 94   INR: 3.26  Imaging/Diagnostic Tests: MRI 09/10: No acute intracranial abnormality. Small remote right posterior cortical and left cerebellar Infarcts. Mild generalized cerebral atrophy with nonspecific cerebral white matter changes, mildly progressed relative to most recent MRI from 2015.  Juan Wilkerson, [place], DO 11/26/2016, 7:16 AM PGY-1, [place] Intern pager: [phone], text pages welcome

## 2016-11-26 NOTE — Progress Notes (Signed)
ANTICOAGULATION CONSULT NOTE - Follow Up Consult  Pharmacy Consult for Warfarin Indication: atrial fibrillation and mechanical AVR   Patient Measurements: Height: 6\' 3"  (190.5 cm) Weight: 175 lb (79.4 kg) IBW/kg (Calculated) : 84.5 Heparin Dosing Weight: 79.4 kg  Vital Signs: Temp: 97.7 F (36.5 C) (09/20 0518) Temp Source: Oral (09/20 0518) BP: 119/68 (09/20 0518) Pulse Rate: 53 (09/20 0518)  Labs:  Recent Labs  11/24/16 0349 11/25/16 0455 11/26/16 0406  HGB 16.4 15.1 15.0  HCT 49.9 47.6 46.3  PLT 155 146* 156  LABPROT 36.0* 35.8* 33.8*  INR 3.64 3.62 3.36  CREATININE 0.83 0.82  --     Estimated Creatinine Clearance: 84.7 mL/min (by C-G formula based on SCr of 0.82 mg/dL).   Assessment: 76 yo M on warfarin pta for mechanical AVR and afib, admitted with subtherapeutic INR, likely from noncompliance with medications due to AMS.    He was started on a heparin bridge. Patient given higher 5mg  doses (for 5 days). INR doubled to 4.49 and heparin was stopped.  Vitamin K 2.5 mg PO given 9/13. Missed dose 9/14.  INR therapeutic today at 3.36  PTA dose warfarin: 1.5 mg TuThSat and 3mg  all other days (per [facility] visit on 8/7)  Goal of Therapy:  INR 2.5-3.5 for mechanical AVR + Afib Monitor platelets by anticoagulation protocol: Yes   Plan:   Warfarin 0.5 mg po x 1 today Monitor daily INR, CBC, clinical course, s/sx of bleed, PO intake, DDI  Thank you Anette Guarneri, PharmD [phone] 11/26/2016 8:07 AM

## 2016-11-26 NOTE — Care Management Important Message (Signed)
Important Message  Patient Details  Name: Juan Wilkerson MRN: [id number] Date of Birth: 08-29-1940   Medicare Important Message Given:  Yes    Nathen May 11/26/2016, 10:39 AM

## 2016-11-27 MED ORDER — WARFARIN SODIUM 3 MG PO TABS
3.0000 mg | ORAL_TABLET | Freq: Once | ORAL | Status: AC
  Administered 2016-11-27: 3 mg via ORAL
  Filled 2016-11-27: qty 1

## 2016-11-27 NOTE — Progress Notes (Signed)
Family Medicine Teaching Service Daily Progress Note Intern Pager: [phone]  Patient name:  Juan Wilkerson Medical record number: [id number] Date of birth: [birth year] Age: 76 y.o. Gender: male  Primary Care Provider: Lovenia Kim, MD Consultants: Psychiatry Code Status: FULL   Pt Overview and Major Events to Date:  9/5 Admitted for AMS 9/6 Psychiatric assessment recommended inpatient placement (Geri-psych)  Assessment and Plan: Juan Wilkerson is a 76 y.o. male presenting with altered mental status. 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 Alert and oriented to self with confabulation. Has tolerated partial restraint removal without significant behavioral problems. In mittens. - Soft restraints, mittens, if agitated - Avoid haldol (anti-dopaminergic in setting of suspected LBD) - Psych re-evaluated, appreciate recs. Geodon 10mg  IM qhs prn agitation and aggression only. No more than 2 shots in 24 hours. - Awaiting inpatient Geri-psych placement or SNF  UTI in setting of resolved sepsis: Resolved. Urine culture + Klebsiella and Citrobacter. Blood culture negative. s/p Vanc (9/11-9/12), Cefepime (9/11-9/13) - Cefixime started (9/14-20) for 10 day treatment- completed  Anticoagulation for Mechanical aortic valve: INR therapeutic 3.26 on 09/20. Goal range 2.5-3.5.  - Coumadin per pharmacy - monitor INR  Acute Urinary Retention: failed voiding trial, foley replaced 9/14, attempt voiding trial again on 9/21 - monitor I/O - flomax increased to 0.8 mg daily on 9/17 - continue finasteride - voiding trial tomorrow  Constipation, imrpoved: last BM 9/18, manual disimpaction on 9/16 - continue scheduled miralax, senna-docusate  - dulcolax suppository on 9/16 - monitor output  Atrial  flutter/fibrillation without RVR: Rate controlled on metoprolol.  - Anticoagulation as above. - Metoprolol 12.5 mg BID   Stage 1 sacral ulcer: Staff aware  - Monitor and continue to rotate in bed to relieve pressure of area  Hypertension, stable: Normotensive - continue metoprolol and stopped Norvasc  Hyperlipidemia: On pravastatin at home. ASCVD 51yr risk 29.5% based on lipid panel from 11/12/16. - continue atorvastatin   Resolved L shoulder/ trapezius pain: Noted stable cyst that 1 in by 1 in on mid left back, mobile and most likely benign - tylenol prn for pain  Resolved AKI: Peak Cr 1.51 likely pre-renal from dehydration. SCr 0.82 on 09/19 - Monitor Cr  FEN/GI: Heart healthy diet VTE Prophylaxis: Coumadin  Disposition: Medically stable for Geri-psych placement or SNF  Subjective:   States feels well this morning, no concerns. Denies pain.   Objective: Temp:  [97.5 F (36.4 C)-97.6 F (36.4 C)] 97.5 F (36.4 C) (09/21 0609) Pulse Rate:  [78-108] 78 (09/21 0609) Resp:  [20] 20 (09/21 0609) BP: (112-115)/(61-90) 115/90 (09/21 0609) SpO2:  [94 %-100 %] 100 % (09/21 0609)  Physical Exam: General: NAD, sitting up comfortably in bed, awake and alert HEENT: moist mucous membranes Cardiovascular: Irregular rhythm, normal rate, loud S2 Respiratory: CTAB, normal WOB Abdomen: soft, nontender, nondistended, + bowel sounds. Extremities: no LE edema. Neuro: Alert and oriented to person and place only. Not oriented to time. Psych: speech with confabulation. No agitation.   Laboratory:  Recent Labs Lab 11/24/16 0633 11/25/16 0455 11/26/16 0406  WBC 6.9 6.8 6.4  HGB 16.4 15.1 15.0  HCT 49.9 47.6 46.3  PLT 155 146* 156    Recent Labs Lab 11/23/16 0434 11/24/16 0633 11/25/16 0455  NA 147* 144 142  K 3.6 3.8 4.1  CL 111 110 107  CO2 31 28 25  BUN 29* 28* 28*  CREATININE 0.79 0.83 0.82  CALCIUM 9.4 9.3 9.0  GLUCOSE 108* 108* 94   INR:  3.26  Imaging/Diagnostic Tests: MRI 09/10: No acute intracranial abnormality. Small remote right posterior cortical and left cerebellar Infarcts. Mild generalized cerebral atrophy with nonspecific cerebral white matter changes, mildly progressed relative to most recent MRI from 2015.  Juan Wilkerson, [place], DO 11/27/2016, 6:58 AM PGY-1, [place] Intern pager: [phone], text pages welcome

## 2016-11-27 NOTE — Progress Notes (Signed)
ANTICOAGULATION CONSULT NOTE - Follow Up Consult  Pharmacy Consult for Warfarin Indication: atrial fibrillation and mechanical AVR   Patient Measurements: Height: 6\' 3"  (190.5 cm) Weight: 175 lb (79.4 kg) IBW/kg (Calculated) : 84.5 Heparin Dosing Weight: 79.4 kg  Vital Signs: Temp: 97.5 F (36.4 C) (09/21 0609) Temp Source: Oral (09/21 0609) BP: 115/90 (09/21 0609) Pulse Rate: 78 (09/21 0609)  Labs:  Recent Labs  11/25/16 0455 11/26/16 0406  HGB 15.1 15.0  HCT 47.6 46.3  PLT 146* 156  LABPROT 35.8* 33.8*  INR 3.62 3.36  CREATININE 0.82  --     Estimated Creatinine Clearance: 84.7 mL/min (by C-G formula based on SCr of 0.82 mg/dL).   Assessment: 76 yo M on warfarin pta for mechanical AVR and afib, admitted with subtherapeutic INR, likely from noncompliance with medications due to AMS.    He was started on a heparin bridge. Patient given higher 5mg  doses (for 5 days). INR doubled to 4.49 and heparin was stopped.  Vitamin K 2.5 mg PO given 9/13. Missed dose 9/14.  INR therapeutic 9/20  PTA dose warfarin: 1.5 mg TuThSat and 3mg  all other days (per [facility] visit on 8/7)  Goal of Therapy:  INR 2.5-3.5 for mechanical AVR + Afib Monitor platelets by anticoagulation protocol: Yes   Plan:   Warfarin 3 mg po x 1 today Monitor daily INR, CBC, clinical course, s/sx of bleed, PO intake, DDI  Thank you Anette Guarneri, PharmD [phone] 11/27/2016 10:49 AM

## 2016-11-28 LAB — CBC
HCT: 45 % (ref 39.0–52.0)
HEMOGLOBIN: 15 g/dL (ref 13.0–17.0)
MCH: 30.5 pg (ref 26.0–34.0)
MCHC: 33.3 g/dL (ref 30.0–36.0)
MCV: 91.5 fL (ref 78.0–100.0)
PLATELETS: 134 10*3/uL — AB (ref 150–400)
RBC: 4.92 MIL/uL (ref 4.22–5.81)
RDW: 13.2 % (ref 11.5–15.5)
WBC: 9.5 10*3/uL (ref 4.0–10.5)

## 2016-11-28 LAB — PROTIME-INR
INR: 2.47
PROTHROMBIN TIME: 26.6 s — AB (ref 11.4–15.2)

## 2016-11-28 MED ORDER — WARFARIN SODIUM 3 MG PO TABS
3.0000 mg | ORAL_TABLET | Freq: Once | ORAL | Status: AC
  Administered 2016-11-28: 3 mg via ORAL
  Filled 2016-11-28: qty 1

## 2016-11-28 NOTE — Progress Notes (Signed)
ANTICOAGULATION CONSULT NOTE - Follow Up Consult  Pharmacy Consult for warfarin Indication: atrial fibrillation and mechanical AVR  Allergies  Allergen Reactions  . Lemon Oil Other (See Comments)    Only skin contact dermatitis    Patient Measurements: Height: 6\' 3"  (190.5 cm) Weight: 175 lb (79.4 kg) IBW/kg (Calculated) : 84.5   Vital Signs: Temp: 97.9 F (36.6 C) (09/22 0604) Temp Source: Oral (09/22 0604) BP: 109/59 (09/22 0604) Pulse Rate: 119 (09/22 0604)  Labs:  Recent Labs  11/26/16 0406 11/28/16 0450  HGB 15.0 15.0  HCT 46.3 45.0  PLT 156 134*  LABPROT 33.8* 26.6*  INR 3.36 2.47    Estimated Creatinine Clearance: 84.7 mL/min (by C-G formula based on SCr of 0.82 mg/dL).   Medications:  Scheduled:  . feeding supplement (PRO-STAT SUGAR FREE 64)  30 mL Oral TID BM  . finasteride  5 mg Oral Daily  . mouth rinse  15 mL Mouth Rinse BID  . metoprolol tartrate  12.5 mg Oral BID  . multivitamin with minerals  1 tablet Oral Daily  . polyethylene glycol  17 g Oral Daily  . senna-docusate  1 tablet Oral BID  . tamsulosin  0.8 mg Oral Daily  . warfarin  3 mg Oral ONCE-1800  . Warfarin - Pharmacist Dosing Inpatient   Does not apply q1800    Assessment: 76 y/o male on warfarin PTA for Afib and mechanical AVR (INR goal 2.5-3.5), admitted with subtherapeutic INR likely due to noncompliance with AMS. Recently INR was > 3.5 and 2 doses were held (9/18 and 9/19). INR on 9/20 was therapeutic, but was not drawn on 9/21. Today INR is slightly subtherapeutic at 2.46, likely due to recent held doses. CBC stable, WNL. No bleeding noted.  Goal of Therapy:  INR 2.5 - 3.5  Monitor platelets by anticoagulation protocol: Yes   Plan:  Warfarin 3 mg x 1 Daily INR Monitor s/sx    Charlene Brooke, PharmD PGY1 [facility] Resident Phone: [phone] After 3:30PM please call [place] 11/28/2016,10:18 AM

## 2016-11-28 NOTE — Progress Notes (Signed)
Family Medicine Teaching Service Daily Progress Note Intern Pager: [phone]  Patient name: Juan Wilkerson Medical record number: [id number] Date of birth: 1940-08-30 Age: 76 y.o. Gender: male  Primary Care Provider: Lovenia Kim, MD Consultants: Psychiatry Code Status: FULL   Pt Overview and Major Events to Date:  9/5 Admitted for AMS 9/6 Psychiatric assessment recommended inpatient placement (Geri-psych) 9/22 will trial voiding again   Assessment and Plan: Juan Wilkerson is a 76 y.o. male presenting with altered mental status. 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 Alert and oriented to self with confabulation. Has tolerated partial restraint removal without significant behavioral problems. No restraints 9/22 am.  - Soft restraints, mittens, if agitated - Avoid haldol (anti-dopaminergic in setting of suspected LBD) - Psych re-evaluated, appreciate recs. Geodon 10mg  IM qhs prn agitation and aggression only. No more than 2 shots in 24 hours. - Awaiting inpatient Geri-psych placement or SNF  Anticoagulation for Mechanical aortic valve: INR 2.47 9/22. Goal range 2.5-3.5.  - Coumadin per pharmacy - monitor INR  Acute Urinary Retention: failed voiding trial, foley replaced 9/14, attempt voiding trial again on 9/21 - monitor I/O - flomax increased to 0.8 mg daily on 9/17 - continue finasteride - voiding trial   Constipation, imrpoved: last BM 9/21, manual disimpaction on 9/16 - continue scheduled miralax, senna-docusate  - dulcolax suppository on 9/16 - monitor output  Atrial flutter/fibrillation without RVR: Rate controlled on metoprolol.  - Anticoagulation as above. - Metoprolol 12.5 mg BID  Stage 1 sacral ulcer: Staff aware  - Monitor and continue to rotate in bed to relieve  pressure of area  Hypertension, stable: Normotensive - continue metoprolol and stopped Norvasc  Hyperlipidemia: On pravastatin at home. ASCVD 86yr risk 29.5% based on lipid panel from 11/12/16. - continue atorvastatin   Resolved L shoulder/ trapezius pain: Noted stable cyst that 1 in by 1 in on mid left back, mobile and most likely benign - tylenol prn for pain  Resolved AKI: Peak Cr 1.51 likely pre-renal from dehydration. SCr 0.82 on 09/19 - Monitor Cr  FEN/GI: Heart healthy diet VTE Prophylaxis: Coumadin  Disposition: Medically stable for Geri-psych placement or SNF  Subjective:   States feels well this morning, no concerns. Denies pain. Happily demented, wants to sing me a song.   Objective: Temp:  [97.5 F (36.4 C)-98 F (36.7 C)] 97.9 F (36.6 C) (09/22 0604) Pulse Rate:  [60-119] 119 (09/22 0604) Resp:  [16-18] 17 (09/22 0604) BP: (102-144)/(59-73) 109/59 (09/22 0604) SpO2:  [91 %-99 %] 91 % (09/22 0604)  Physical Exam: General: NAD, sitting up comfortably in bed, awake and alert HEENT: moist mucous membranes Cardiovascular: Irregular rhythm, normal rate, loud S2 Respiratory: CTAB, normal WOB Abdomen: soft, nontender, nondistended, + bowel sounds. Extremities: no LE edema. Neuro: Alert and oriented to person and place only. Not oriented to time. Psych: speech with confabulation. No agitation.   Laboratory:  Recent Labs Lab 11/25/16 0455 11/26/16 0406 11/28/16 0450  WBC 6.8 6.4 9.5  HGB 15.1 15.0 15.0  HCT 47.6 46.3 45.0  PLT 146* 156 134*    Recent Labs Lab 11/23/16 0434 11/24/16 0633 11/25/16 0455  NA 147* 144 142  K 3.6 3.8 4.1  CL 111 110 107  CO2 31 28 25   BUN 29* 28* 28*  CREATININE 0.79 0.83 0.82  CALCIUM 9.4 9.3 9.0  GLUCOSE 108*  108* 94   INR: 3.26 > 2.47  Imaging/Diagnostic Tests: MRI 09/10: No acute intracranial abnormality. Small remote right posterior cortical and left cerebellar Infarcts. Mild generalized cerebral atrophy with  nonspecific cerebral white matter changes, mildly progressed relative to most recent MRI from 2015.  Juan Hilding, MD 11/28/2016, 8:52 AM PGY-2, [place] Intern pager: [phone], text pages welcome

## 2016-11-28 NOTE — Progress Notes (Signed)
Paged [facility] to put orders in to DC foley. Urine is looking very cloudy and purulent.

## 2016-11-29 LAB — CBC
HCT: 45.3 % (ref 39.0–52.0)
Hemoglobin: 15 g/dL (ref 13.0–17.0)
MCH: 29.8 pg (ref 26.0–34.0)
MCHC: 33.1 g/dL (ref 30.0–36.0)
MCV: 89.9 fL (ref 78.0–100.0)
PLATELETS: 135 10*3/uL — AB (ref 150–400)
RBC: 5.04 MIL/uL (ref 4.22–5.81)
RDW: 13.2 % (ref 11.5–15.5)
WBC: 9.3 10*3/uL (ref 4.0–10.5)

## 2016-11-29 LAB — PROTIME-INR
INR: 2.37
PROTHROMBIN TIME: 25.7 s — AB (ref 11.4–15.2)

## 2016-11-29 MED ORDER — WARFARIN SODIUM 4 MG PO TABS
4.0000 mg | ORAL_TABLET | Freq: Once | ORAL | Status: AC
  Administered 2016-11-29: 4 mg via ORAL
  Filled 2016-11-29: qty 1

## 2016-11-29 NOTE — Progress Notes (Signed)
ANTICOAGULATION CONSULT NOTE - Follow Up Consult  Pharmacy Consult for warfarin Indication: atrial fibrillation and mechanical AVR  Allergies  Allergen Reactions  . Lemon Oil Other (See Comments)    Only skin contact dermatitis    Patient Measurements: Height: 6\' 3"  (190.5 cm) Weight: 175 lb (79.4 kg) IBW/kg (Calculated) : 84.5   Vital Signs: Temp: 97.9 F (36.6 C) (09/23 0500) Temp Source: Oral (09/23 0500) BP: 106/63 (09/23 0500) Pulse Rate: 57 (09/23 0500)  Labs:  Recent Labs  11/28/16 0450 11/29/16 0524  HGB 15.0 15.0  HCT 45.0 45.3  PLT 134* 135*  LABPROT 26.6* 25.7*  INR 2.47 2.37    Estimated Creatinine Clearance: 84.7 mL/min (by C-G formula based on SCr of 0.82 mg/dL).   Medications:  Scheduled:  . feeding supplement (PRO-STAT SUGAR FREE 64)  30 mL Oral TID BM  . finasteride  5 mg Oral Daily  . mouth rinse  15 mL Mouth Rinse BID  . metoprolol tartrate  12.5 mg Oral BID  . multivitamin with minerals  1 tablet Oral Daily  . polyethylene glycol  17 g Oral Daily  . senna-docusate  1 tablet Oral BID  . tamsulosin  0.8 mg Oral Daily  . Warfarin - Pharmacist Dosing Inpatient   Does not apply q1800    Assessment: 76 y/o male on warfarin PTA for Afib and mechanical AVR (INR goal 2.5-3.5), admitted with subtherapeutic INR likely due to noncompliance with AMS. Recently INR was > 3.5 several days after 5 mg dose given, subsequently 2 doses were held (9/18 and 9/19).   Today INR is subtherapeutic at 2.37, likely due to recent held doses. Patient appears sensitive to small changes in dose. CBC stable, WNL. No bleeding noted.  PTA warfarin dose: 3 mg daily except 1.5 mg T/Th/Sat  Goal of Therapy:  INR 2.5 - 3.5  Monitor platelets by anticoagulation protocol: Yes   Plan:  Warfarin 4 mg x 1 Daily INR Monitor s/sx of bleeding   Charlene Brooke, PharmD PGY1 Pharmacy Resident Phone: [phone] After 3:30PM please call [place] [phone] 11/29/2016,9:51  AM

## 2016-11-29 NOTE — Progress Notes (Signed)
Family Medicine Teaching Service Daily Progress Note Intern Pager: [phone]  Patient name: Juan Wilkerson Medical record number: [id number] Date of birth: 06-23-1940 Age: 76 y.o. Gender: male  Primary Care Provider: Lovenia Kim, MD Consultants: Psychiatry Code Status: FULL   Pt Overview and Major Events to Date:  9/5 Admitted for AMS 9/6 Psychiatric assessment recommended inpatient placement (Geri-psych) 9/22 will trial voiding again   Assessment and Plan: Juan Wilkerson is a 76 y.o. male presenting with altered mental status. 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 Alert and oriented to self with confabulation. Has tolerated partial restraint removal without significant behavioral problems. No restraints 9/22 am.  - Soft restraints, mittens, if agitated - Avoid haldol (anti-dopaminergic in setting of suspected LBD) - Psych re-evaluated, appreciate recs. Geodon 10mg  IM qhs prn agitation and aggression only. No more than 2 shots in 24 hours. - Awaiting inpatient Geri-psych placement or SNF  Anticoagulation for Mechanical aortic valve: INR subtherapeutic at 2.37 9/23. Goal range 2.5-3.5.  - Coumadin per pharmacy - monitor INR  Acute Urinary Retention: failed voiding trial, foley replaced 9/14, attempt voiding trial again on 9/23 - monitor I/O - flomax increased to 0.8 mg daily on 9/17 - continue finasteride - voiding trial today  Constipation, imrpoved: last BM 9/22 - continue scheduled miralax, senna-docusate  - monitor output  Atrial flutter/fibrillation without RVR: Rate controlled on metoprolol.  - Anticoagulation as above. - Metoprolol 12.5 mg BID  Stage 1 sacral ulcer: Staff aware  - Monitor and continue to rotate in bed to relieve pressure of area  Hypertension, stable:  Normotensive - continue metoprolol and stopped Norvasc  Hyperlipidemia: On pravastatin at home.  - discontinued due to risks vs benefits for long term treatment with advanced demetia  Resolved L shoulder/ trapezius pain: Noted stable cyst that 1 in by 1 in on mid left back, mobile and most likely benign - tylenol prn for pain  Resolved AKI: Peak Cr 1.51 likely pre-renal from dehydration. SCr 0.82 on 09/19 - Monitor Cr  FEN/GI: Heart healthy diet VTE Prophylaxis: Coumadin  Disposition: Medically stable for Geri-psych placement or SNF  Subjective:   States feels well this morning, no concerns. Denies pain. Happily demented.  Objective: Temp:  [97.8 F (36.6 C)-98.5 F (36.9 C)] 97.9 F (36.6 C) (09/23 0500) Pulse Rate:  [53-80] 57 (09/23 0500) Resp:  [16-18] 17 (09/23 0500) BP: (99-113)/(63-66) 106/63 (09/23 0500) SpO2:  [90 %-99 %] 94 % (09/23 0500)  Physical Exam: General: NAD, sitting up comfortably in bed, awake and alert HEENT: moist mucous membranes Cardiovascular: Irregular rhythm, normal rate, loud S2 Respiratory: CTAB, normal WOB Abdomen: soft, nontender, nondistended, + bowel sounds. Extremities: no LE edema. Neuro: Alert and oriented to self only. Not oriented to place or time. Psych: speech with confabulation. No agitation.   Laboratory:  Recent Labs Lab 11/26/16 0406 11/28/16 0450 11/29/16 0524  WBC 6.4 9.5 9.3  HGB 15.0 15.0 15.0  HCT 46.3 45.0 45.3  PLT 156 134* 135*    Recent Labs Lab 11/23/16 0434 11/24/16 0633 11/25/16 0455  NA 147* 144 142  K 3.6 3.8 4.1  CL 111 110 107  CO2 31 28 25   BUN 29* 28* 28*  CREATININE 0.79 0.83 0.82  CALCIUM 9.4 9.3 9.0  GLUCOSE 108* 108* 94   INR: 2.37  Imaging/Diagnostic Tests: MRI 09/10: No acute  intracranial abnormality. Small remote right posterior cortical and left cerebellar Infarcts. Mild generalized cerebral atrophy with nonspecific cerebral white matter changes, mildly progressed relative to  most recent MRI from 2015.  Coco Sharpnack, [place], DO 11/29/2016, 7:37 AM PGY-1, [place] Intern pager: [phone], text pages welcome

## 2016-11-29 NOTE — Progress Notes (Signed)
16 f. Foley catheter inserted using sterile technique due to urinary retention. P.J., RN and Judson Roch, RN were assisting. Patient tolerated procedure well. Perineal care performed before procedure and foley care performed post procedure.

## 2016-11-29 NOTE — Progress Notes (Signed)
RN paged Family Medicine MD on call and made aware patient has not voided in 9 hours since Foley removed and bladder scanner states greater than 200 ml in bladder.  Dr. Garlan Fillers returned page and ordered for Foley catheter to be inserted.  P.J. Linus Mako, RN

## 2016-11-30 LAB — PROTIME-INR
INR: 1.79
PROTHROMBIN TIME: 20.6 s — AB (ref 11.4–15.2)

## 2016-11-30 MED ORDER — WARFARIN SODIUM 4 MG PO TABS
4.0000 mg | ORAL_TABLET | Freq: Once | ORAL | Status: AC
  Administered 2016-11-30: 4 mg via ORAL
  Filled 2016-11-30: qty 1

## 2016-11-30 MED ORDER — DIGOXIN 125 MCG PO TABS
0.2500 mg | ORAL_TABLET | Freq: Three times a day (TID) | ORAL | Status: AC
Start: 2016-11-30 — End: 2016-11-30
  Administered 2016-11-30 (×3): 0.25 mg via ORAL
  Filled 2016-11-30 (×3): qty 2

## 2016-11-30 MED ORDER — SODIUM CHLORIDE 0.9 % IV BOLUS (SEPSIS)
500.0000 mL | Freq: Once | INTRAVENOUS | Status: AC
  Administered 2016-11-30: 500 mL via INTRAVENOUS

## 2016-11-30 MED ORDER — ENOXAPARIN SODIUM 80 MG/0.8ML ~~LOC~~ SOLN
80.0000 mg | Freq: Two times a day (BID) | SUBCUTANEOUS | Status: DC
  Administered 2016-11-30 – 2016-12-02 (×4): 80 mg via SUBCUTANEOUS
  Filled 2016-11-30 (×3): qty 0.8

## 2016-11-30 MED ORDER — PRO-STAT SUGAR FREE PO LIQD
30.0000 mL | Freq: Two times a day (BID) | ORAL | Status: DC
  Administered 2016-12-01: 30 mL via ORAL
  Filled 2016-11-30: qty 30

## 2016-11-30 MED ORDER — SODIUM CHLORIDE 0.9 % IV BOLUS (SEPSIS)
1000.0000 mL | Freq: Once | INTRAVENOUS | Status: AC
  Administered 2016-11-30: 1000 mL via INTRAVENOUS

## 2016-11-30 MED ORDER — ENSURE ENLIVE PO LIQD
237.0000 mL | Freq: Two times a day (BID) | ORAL | Status: DC
  Administered 2016-12-01 – 2016-12-02 (×3): 237 mL via ORAL

## 2016-11-30 NOTE — Progress Notes (Signed)
Family Medicine Teaching Service Daily Progress Note Intern Pager: [phone]  Patient name: Juan Wilkerson Medical record number: [id number] Date of birth: 1940/10/20 Age: 76 y.o. Gender: male  Primary Care Provider: Lovenia Kim, MD Consultants: Psychiatry Code Status: FULL   Pt Overview and Major Events to Date:  9/5 Admitted for AMS 9/6 Psychiatric assessment recommended inpatient placement ([facility]-psych) 9/22 voiding trial failed  Assessment and Plan: Juan Wilkerson is a 76 y.o. male presenting with altered mental status. 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 Alert and oriented to self with confabulation. Has tolerated partial restraint removal without significant behavioral problems. No restraints 9/24 am.  - Soft restraints, mittens, if agitated - Avoid haldol (anti-dopaminergic in setting of suspected LBD) - Psych re-evaluated, appreciate recs. Geodon 10mg  IM qhs prn agitation and aggression only. No more than 2 shots in 24 hours. - Awaiting inpatient Geri-psych placement or SNF  Anticoagulation for Mechanical aortic valve: INR subtherapeutic at 1.79 on 9/24. Goal range 2.5-3.5.  - Coumadin per pharmacy - monitor INR  Acute Urinary Retention: failed voiding trial, foley replaced on 9/23 - monitor I/O - flomax increased to 0.8 mg daily on 9/17 - continue finasteride  Constipation, imrpoved: last BM 9/22 - continue scheduled miralax, senna-docusate  - monitor output  Atrial flutter/fibrillation without RVR: Rate controlled on metoprolol.  - Anticoagulation as above. - Metoprolol 12.5 mg BID to be d/c due to low pressures- will add digoxin for rate control. Loading dose today.  - Monitor  Stage 1 sacral ulcer: Staff aware  - Monitor and continue to rotate in bed to relieve  pressure of area  Hypertension, stable: Hypotensive. To receive 1.5 L of fluid today - will stop metoprolol and stopped Norvasc  Hyperlipidemia: On pravastatin at home.  - discontinued due to risks vs benefits for long term treatment with advanced demetia  Resolved L shoulder/ trapezius pain: Noted stable cyst that 1 in by 1 in on mid left back, mobile and most likely benign - tylenol prn for pain  Resolved AKI: Peak Cr 1.51 likely pre-renal from dehydration. SCr 0.82 on 09/19 - Monitor Cr  FEN/GI: Heart healthy diet VTE Prophylaxis: Coumadin  Disposition: Medically stable for Geri-psych placement or SNF  Subjective:   States feels well this morning, no concerns. Reports pain in his L shoulder. Reports that he is hungry. Happily demented.  Objective: Temp:  [97.4 F (36.3 C)-97.8 F (36.6 C)] 97.8 F (36.6 C) (09/24 0544) Pulse Rate:  [54-83] 83 (09/24 0544) Resp:  [16-17] 17 (09/24 0544) BP: (86-118)/(52-92) 92/57 (09/24 0544) SpO2:  [93 %-99 %] 98 % (09/24 0544)  Physical Exam: General: NAD, laying comfortably in bed, awake and alert HEENT: moist mucous membranes Cardiovascular: Irregular rhythm, normal rate, loud S2 Respiratory: CTAB, normal WOB Abdomen: soft, nontender, nondistended, + bowel sounds. Extremities: no LE edema. Neuro: Alert and oriented to self and place. Not oriented to time. Psych: speech with confabulation. No agitation.   Laboratory:  Recent Labs Lab 11/26/16 0406 11/28/16 0450 11/29/16 0524  WBC 6.4 9.5 9.3  HGB 15.0 15.0 15.0  HCT 46.3 45.0 45.3  PLT 156 134* 135*    Recent Labs Lab 11/24/16 0633 11/25/16 0455  NA 144 142  K 3.8 4.1  CL 110 107  CO2 28 25  BUN 28* 28*  CREATININE 0.83 0.82  CALCIUM 9.3 9.0  GLUCOSE 108* 94   INR: 1.79  Imaging/Diagnostic Tests: MRI 09/10: No acute intracranial abnormality. Small remote right posterior cortical and left cerebellar Infarcts. Mild generalized cerebral atrophy with  nonspecific cerebral white matter changes, mildly progressed relative to most recent MRI from 2015.  Juan Wilkerson, [place], DO 11/30/2016, 6:54 AM PGY-1, [place] Intern pager: [phone], text pages welcome

## 2016-11-30 NOTE — Progress Notes (Signed)
RN paged MD on call regarding patient's decreased urine output of only 150 ml amber urine after Foley catheter placed. Order received for bolus of 500 ml NS. IV team consulted and IV placed, bolus started per order.  P.J. Linus Mako, RN

## 2016-11-30 NOTE — Progress Notes (Addendum)
ANTICOAGULATION CONSULT NOTE - Follow Up Consult  Pharmacy Consult for warfarin and enoxaparin Indication: atrial fibrillation and mechanical AVR  Allergies  Allergen Reactions  . Lemon Oil Other (See Comments)    Only skin contact dermatitis    Patient Measurements: Height: 6\' 3"  (190.5 cm) Weight: 175 lb (79.4 kg) IBW/kg (Calculated) : 84.5   Vital Signs: Temp: 97.8 F (36.6 C) (09/24 0544) Temp Source: Oral (09/24 0544) BP: 92/57 (09/24 0544) Pulse Rate: 76 (09/24 1134)  Labs:  Recent Labs  11/28/16 0450 11/29/16 0524 11/30/16 0559  HGB 15.0 15.0  --   HCT 45.0 45.3  --   PLT 134* 135*  --   LABPROT 26.6* 25.7* 20.6*  INR 2.47 2.37 1.79    Estimated Creatinine Clearance: 84.7 mL/min (by C-G formula based on SCr of 0.82 mg/dL).   Medications:  Scheduled:  . digoxin  0.25 mg Oral TID  . feeding supplement (PRO-STAT SUGAR FREE 64)  30 mL Oral TID BM  . finasteride  5 mg Oral Daily  . mouth rinse  15 mL Mouth Rinse BID  . metoprolol tartrate  12.5 mg Oral BID  . multivitamin with minerals  1 tablet Oral Daily  . polyethylene glycol  17 g Oral Daily  . senna-docusate  1 tablet Oral BID  . tamsulosin  0.8 mg Oral Daily  . Warfarin - Pharmacist Dosing Inpatient   Does not apply q1800    Assessment: 76 y/o male on warfarin PTA for Afib and mechanical AVR (INR goal 2.5-3.5), admitted with subtherapeutic INR likely due to noncompliance with AMS. Recently INR was > 3.5 several days after 5 mg dose given, subsequently 2 doses were held (9/18 and 9/19).   Today INR continues to trend down to 1.79.  This could be a reflection of doses held 9/18-19 and/or Vitamin K administration. Spoke with FPTS, will bridge with Lovenox while awaiting therapeutic INR.  Patient appears sensitive to small changes in warfarin dose. CBC stable, WNL. No bleeding noted.  PTA warfarin dose: 3 mg daily except 1.5 mg T/Th/Sat  Goal of Therapy:  INR 2.5 - 3.5  Monitor platelets by  anticoagulation protocol: Yes   Plan:  Enoxaparin 80mg  sq q12h Warfarin 4 mg x 1 Daily INR Monitor s/sx of bleeding   [employer], Pharm.D., BCPS Clinical Pharmacist Pager: [phone] Clinical phone for 11/30/2016 from 8:30-4:00 is [phone]. After 4pm, please call Main Rx [phone]) for assistance. 11/30/2016 1:30 PM

## 2016-11-30 NOTE — Progress Notes (Addendum)
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  Ongoing  GOAL:   Patient will meet greater than or equal to 90% of their needs  Progressing  MONITOR:   PO intake, Supplement acceptance, Labs, Weight trends, Skin, I & O's  REASON FOR ASSESSMENT:   Low Braden    ASSESSMENT:   Juan Wilkerson is a 76 y.o. male presenting with altered mental status. PMH is significant for atrial flutter/fibrillation on warfarin, hypertension, hyperlipidemia, ?Marfan's syndrome, Aortic valve replacement, microscopic hematuria, left renal mass  Pt working with physical therapy at time of visit. Noted pt with confusion- asking therapist to call his brother on the phone. Pt now without mittens.   Meal completion remains variable; noted 5-45% meal completion per doc flowsheets. Pt taking Prostat and MVI supplements as ordered.  CSW and RNCM following. Plan to discharge to geri-psych facility once bed is available. Per MD notes, pt may also require guardianship for long term SNF placement (pt too medically complex for ALF).   Labs reviewed.   Diet Order:  Diet Heart Room service appropriate? Yes; Fluid consistency: Thin  Skin:  Wound (see comment) (DTI coccyx)  Last BM:  11/28/16  Height:   Ht Readings from Last 1 Encounters:  11/11/16 6\' 3"  (1.905 m)    Weight:   Wt Readings from Last 1 Encounters:  11/11/16 175 lb (79.4 kg)    Ideal Body Weight:  89.1 kg  BMI:   Body mass index is 21.87 kg/m.  Estimated Nutritional Needs:   Kcal:  7106-2694  Protein:  110-125 grams  Fluid:  2.3-2.5 L  EDUCATION NEEDS:   Education needs no appropriate at this time  Ishaaq Penna A. Jimmye Norman, RD, LDN, CDE Pager: [phone] After hours Pager: [phone]

## 2016-11-30 NOTE — Progress Notes (Signed)
RN paged MD on call that patient's BP 86/52 and HR 54 and Metoprolol held.  Dr. Garlan Fillers called back and wonders if patient is symptomatic and RN informed him patient is asymptomatic at this time.  Dr. Garlan Fillers states he has no new orders for patient and instructed RN to call back if patient becomes symptomatic. RN will continue to monitor patient and make MD aware of any changes.  P.J. Linus Mako, RN

## 2016-11-30 NOTE — Care Management Important Message (Signed)
Important Message  Patient Details  Name: Juan Wilkerson MRN: [id number] Date of Birth: [birth year]   Medicare Important Message Given:  Yes    Nathen May 11/30/2016, 10:42 AM

## 2016-11-30 NOTE — Progress Notes (Signed)
Physical Therapy Treatment Patient Details Name: Juan Wilkerson MRN: [id number] DOB: November 20, 1940 Today's Date: 11/30/2016    History of Present Illness Juan Wilkerson is a 76 y.o. male presenting with altered mental status. PMH is significant for atrial flutter/fibrillation on warfarin, hypertension, hyperlipidemia, ?Marfan's syndrome, Aortic valve replacement, microscopic hematuria, left renal mass    PT Comments    Pt performed decreased activity during session as patient intermittently agitated due to AMS.  Pt will benefit from skilled rehab at SNF to improve strength and function.  Pt with poor safety awareness and perseveration on finding his car in the woods where he has his last tennis match.      Follow Up Recommendations  SNF     Equipment Recommendations  None recommended by PT    Recommendations for Other Services       Precautions / Restrictions Precautions Precautions: Fall Restrictions Weight Bearing Restrictions: No    Mobility  Bed Mobility Overal bed mobility: Needs Assistance Bed Mobility: Supine to Sit;Sit to Supine     Supine to sit: Min assist     General bed mobility comments: Pt able to advance LEs to edge of bed.  Pt able to elevate trunk with rocking momentum, min assist to scoot to edge of bed.    Transfers Overall transfer level: Needs assistance Equipment used: Rolling walker (2 wheeled) Transfers: Sit to/from Stand   Stand pivot transfers: Min assist       General transfer comment: Pt attempting to stand impulsively without RW.  Pt able to stand from elevated surface with min guard assistance and required min assist from standard seat height without arm rests.  Cues for hand placement to and from seated surface.  Pt with limited to no carryover.    Ambulation/Gait Ambulation/Gait assistance: Min assist Ambulation Distance (Feet): 60 Feet (gait limited to patient agitation for safety.  ) Assistive device: Rolling walker (2 wheeled) Gait  Pattern/deviations: Step-through pattern;Decreased stride length;Trunk flexed;Drifts right/left Gait velocity: slower Gait velocity interpretation: Below normal speed for age/gender General Gait Details: Pt required cues for RW safety, assist with turns and backing and step closer to RW for added support.  Pt remains to fatigue quickly.     Stairs            Wheelchair Mobility    Modified Rankin (Stroke Patients Only)       Balance Overall balance assessment: Needs assistance Sitting-balance support: Feet supported;Single extremity supported Sitting balance-Leahy Scale: Fair Sitting balance - Comments: able to sit EOB min guard this session Postural control: Posterior lean Standing balance support: Bilateral upper extremity supported Standing balance-Leahy Scale: Poor Standing balance comment: reliant on external support from therapist for upright                            Cognition Arousal/Alertness: Awake/alert Behavior During Therapy: [facility] for tasks assessed/performed;Anxious Overall Cognitive Status: No family/caregiver present to determine baseline cognitive functioning                                 General Comments: Pt making non-sensical conversation throughout session.        Exercises      General Comments        Pertinent Vitals/Pain Pain Assessment: No/denies pain    Home Living  Prior Function            PT Goals (current goals can now be found in the care plan section) Acute Rehab PT Goals Patient Stated Goal: none stated Potential to Achieve Goals: Fair Progress towards PT goals: Progressing toward goals    Frequency    Min 2X/week      PT Plan Current plan remains appropriate    Co-evaluation              AM-PAC PT "6 Clicks" Daily Activity  Outcome Measure  Difficulty turning over in bed (including adjusting bedclothes, sheets and blankets)?: Unable Difficulty  moving from lying on back to sitting on the side of the bed? : Unable Difficulty sitting down on and standing up from a chair with arms (e.g., wheelchair, bedside commode, etc,.)?: Unable Help needed moving to and from a bed to chair (including a wheelchair)?: A Little Help needed walking in hospital room?: A Little Help needed climbing 3-5 steps with a railing? : A Lot 6 Click Score: 11    End of Session Equipment Utilized During Treatment: Gait belt Activity Tolerance: Patient tolerated treatment well;Patient limited by fatigue;Treatment limited secondary to agitation Patient left: in chair;with call bell/phone within reach;with chair alarm set (tele sitter in room.  ) Nurse Communication: Mobility status PT Visit Diagnosis: Unsteadiness on feet (R26.81);Other abnormalities of gait and mobility (R26.89)     Time: 1275-1700 PT Time Calculation (min) (ACUTE ONLY): 18 min  Charges:  $Gait Training: 8-22 mins                    G Codes:       Governor Rooks, PTA pager [phone]    Cristela Blue 11/30/2016, 12:54 PM

## 2016-12-01 LAB — BASIC METABOLIC PANEL
ANION GAP: 7 (ref 5–15)
BUN: 17 mg/dL (ref 6–20)
CALCIUM: 8.9 mg/dL (ref 8.9–10.3)
CHLORIDE: 105 mmol/L (ref 101–111)
CO2: 25 mmol/L (ref 22–32)
CREATININE: 0.76 mg/dL (ref 0.61–1.24)
GFR calc Af Amer: >60 mL/min (ref >60)
GFR calc non Af Amer: >60 mL/min (ref >60)
Glucose, Bld: 95 mg/dL (ref 65–99)
POTASSIUM: 3.6 mmol/L (ref 3.5–5.1)
Sodium: 137 mmol/L (ref 135–145)

## 2016-12-01 LAB — PROTIME-INR
INR: 2.29
Prothrombin Time: 25 seconds — ABNORMAL HIGH (ref 11.4–15.2)

## 2016-12-01 MED ORDER — WARFARIN SODIUM 1 MG PO TABS
1.5000 mg | ORAL_TABLET | Freq: Once | ORAL | Status: AC
  Administered 2016-12-01: 1.5 mg via ORAL
  Filled 2016-12-01: qty 1

## 2016-12-01 MED ORDER — DIGOXIN 125 MCG PO TABS
0.0625 mg | ORAL_TABLET | Freq: Every day | ORAL | Status: DC
  Administered 2016-12-01 – 2016-12-02 (×2): 0.0625 mg via ORAL
  Filled 2016-12-01 (×3): qty 1

## 2016-12-01 MED ORDER — DIGOXIN 125 MCG PO TABS: 0.1250 mg | ORAL_TABLET | Freq: Every day | ORAL | Status: DC

## 2016-12-01 NOTE — Progress Notes (Signed)
incontinent

## 2016-12-01 NOTE — Progress Notes (Signed)
ANTICOAGULATION CONSULT NOTE - Follow Up Consult  Pharmacy Consult for warfarin and enoxaparin Indication: atrial fibrillation and mechanical AVR  Allergies  Allergen Reactions  . Lemon Oil Other (See Comments)    Only skin contact dermatitis    Patient Measurements: Height: 6\' 3"  (190.5 cm) Weight: 175 lb (79.4 kg) IBW/kg (Calculated) : 84.5   Vital Signs: Temp: 98.2 F (36.8 C) (09/25 1447) Temp Source: Oral (09/25 0535) BP: 93/61 (09/25 1447) Pulse Rate: 59 (09/25 1447)  Labs:  Recent Labs  11/29/16 0524 11/30/16 0559 12/01/16 0523  HGB 15.0  --   --   HCT 45.3  --   --   PLT 135*  --   --   LABPROT 25.7* 20.6* 25.0*  INR 2.37 1.79 2.29  CREATININE  --   --  0.76    Estimated Creatinine Clearance: 86.8 mL/min (by C-G formula based on SCr of 0.76 mg/dL).   Medications:  Scheduled:  . digoxin  0.0625 mg Oral Daily  . enoxaparin (LOVENOX) injection  80 mg Subcutaneous BID  . feeding supplement (ENSURE ENLIVE)  237 mL Oral BID BM  . feeding supplement (PRO-STAT SUGAR FREE 64)  30 mL Oral BID BM  . finasteride  5 mg Oral Daily  . mouth rinse  15 mL Mouth Rinse BID  . multivitamin with minerals  1 tablet Oral Daily  . polyethylene glycol  17 g Oral Daily  . senna-docusate  1 tablet Oral BID  . tamsulosin  0.8 mg Oral Daily  . Warfarin - Pharmacist Dosing Inpatient   Does not apply q1800    Assessment: 76 y/o male on warfarin PTA for Afib and mechanical AVR (INR goal 2.5-3.5), admitted with subtherapeutic INR likely due to noncompliance with AMS. Recently INR was > 3.5 several days after 5 mg dose given, subsequently 2 doses were held (9/18 and 9/19).   INR is subtherapeutic but trending up.  Bridging with Lovenox while awaiting therapeutic INR.  Patient appears sensitive to small changes in warfarin dose. CBC stable, WNL. No bleeding noted.  PTA warfarin dose: 3 mg daily except 1.5 mg T/Th/Sat  Goal of Therapy:  INR 2.5 - 3.5  Monitor platelets by  anticoagulation protocol: Yes   Plan:  Continue Enoxaparin 80mg  sq q12h Warfarin 1.5 mg x 1 Daily INR Monitor s/sx of bleeding   [employer], Pharm.D., BCPS Clinical Pharmacist Pager: [phone] Clinical phone for 12/01/2016 from 8:30-4:00 is [phone]. After 4pm, please call Main Rx [phone]) for assistance. 12/01/2016 4:56 PM

## 2016-12-01 NOTE — Progress Notes (Signed)
COUMADIN

## 2016-12-01 NOTE — Progress Notes (Signed)
Family Medicine Teaching Service Daily Progress Note Intern Pager: [phone]  Patient name: Juan Wilkerson Medical record number: [id number] Date of birth: [birth year] Age: 76 y.o. Gender: male  Primary Care Provider: Lovenia Kim, MD Consultants: Psychiatry Code Status: FULL   Pt Overview and Major Events to Date:  9/5 Admitted for AMS 9/6 Psychiatric assessment recommended inpatient placement (Geri-psych) 9/22 voiding trial failed  Assessment and Plan: Juan Wilkerson is a 76 y.o. male presenting with altered mental status. 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 Alert and oriented to self with confabulation. Has tolerated partial restraint removal without significant behavioral problems. No restraints 9/25 am, no mittens - Soft restraints, mittens, if agitated - Avoid haldol (anti-dopaminergic in setting of suspected LBD) - Psych re-evaluated, appreciate recs. Geodon 10mg  IM qhs prn agitation and aggression only. No more than 2 shots in 24 hours. - Awaiting inpatient Geri-psych placement or SNF  Anticoagulation for Mechanical aortic valve: INR subtherapeutic at 2.29 on 9/25. Goal range 2.5-3.5.  - Coumadin per pharmacy - monitor INR  Acute Urinary Retention: failed voiding trial, foley replaced on 9/23 - monitor I/O - flomax increased to 0.8 mg daily on 9/17 - continue finasteride  Constipation, imrpoved: last BM 9/22 - continue scheduled miralax, senna-docusate  - monitor output  Atrial flutter/fibrillation without RVR: Rate controlled on metoprolol.  - Anticoagulation as above. - Metoprolol 12.5 mg BID to be d/c due to low pressures- Adding digoxin for rate control 0.0625 mg daily. D/c metoprolol - Monitor  Stage 1 sacral ulcer: Staff aware  - Monitor and continue to rotate in  bed to relieve pressure of area  Hypertension, stable: Normotensive. - will stop metoprolol and stopped Norvasc.  Hyperlipidemia: On pravastatin at home.  - discontinued due to risks vs benefits for long term treatment with advanced demetia  Resolved L shoulder/ trapezius pain: Noted stable cyst that 1 in by 1 in on mid left back, mobile and most likely benign - tylenol prn for pain  Resolved AKI: Peak Cr 1.51 likely pre-renal from dehydration. SCr 0.82 on 09/19 - Monitor Cr  FEN/GI: Heart healthy diet VTE Prophylaxis: Coumadin  Disposition: Medically stable for Geri-psych placement or SNF  Subjective:   States feels well this morning, no concerns. Reports pain in his L shoulder. Reports that he is hungry. Happily demented.  Objective: Temp:  [97.4 F (36.3 C)-98 F (36.7 C)] 97.4 F (36.3 C) (09/25 0535) Pulse Rate:  [56-76] 57 (09/25 0535) Resp:  [18] 18 (09/25 0535) BP: (106-121)/(53-67) 106/60 (09/25 0535) SpO2:  [98 %-100 %] 98 % (09/24 2259)  Physical Exam: General: NAD, laying comfortably in bed, awake and alert HEENT: moist mucous membranes Cardiovascular: Irregular rhythm, normal rate, loud S2 Respiratory: CTAB, normal WOB Abdomen: soft, nontender, nondistended, + bowel sounds. Extremities: no LE edema. Neuro: Alert and oriented to self and place. Not oriented to time. Psych: speech with confabulation. No agitation.   Laboratory:  Recent Labs Lab 11/26/16 0406 11/28/16 0450 11/29/16 0524  WBC 6.4 9.5 9.3  HGB 15.0 15.0 15.0  HCT 46.3 45.0 45.3  PLT 156 134* 135*    Recent Labs Lab 11/25/16 0455 12/01/16 0523  NA 142 137  K 4.1 3.6  CL 107 105  CO2 25 25  BUN 28* 17  CREATININE 0.82 0.76  CALCIUM 9.0 8.9  GLUCOSE 94 95   INR:  2.29  Imaging/Diagnostic Tests: MRI 09/10: No acute intracranial abnormality. Small remote right posterior cortical and left cerebellar Infarcts. Mild generalized cerebral atrophy with nonspecific cerebral white  matter changes, mildly progressed relative to most recent MRI from 2015.  Juan Wilkerson, [place], DO 12/01/2016, 9:38 AM PGY-1, [place] Intern pager: [phone], text pages welcome

## 2016-12-01 NOTE — Progress Notes (Signed)
CSW staffed case with Medical Director regarding patient need for foley and mitts. Due to safety, we are going to see if we can get the SNF to accept Mitts.   Juan Wilkerson LCSWA [phone]

## 2016-12-01 NOTE — Progress Notes (Signed)
Physical Therapy Treatment Patient Details Name: Juan Wilkerson MRN: [id number] DOB: [birth year] Today's Date: 12/01/2016    History of Present Illness LEON MONTOYA is a 76 y.o. male presenting with altered mental status. PMH is significant for atrial flutter/fibrillation on warfarin, hypertension, hyperlipidemia, ?Marfan's syndrome, Aortic valve replacement, microscopic hematuria, left renal mass    PT Comments    Patient able to walk farther this session, but needed seated rest.  Still pleasantly confused, but not agitated this session.  Will continue to benefit from skilled PT in the acute setting prior to d/c to SNF level rehab.   Follow Up Recommendations  SNF     Equipment Recommendations  None recommended by PT    Recommendations for Other Services       Precautions / Restrictions Precautions Precautions: Fall    Mobility  Bed Mobility Overal bed mobility: Needs Assistance Bed Mobility: Supine to Sit;Sit to Supine     Supine to sit: Min guard;HOB elevated Sit to supine: Supervision   General bed mobility comments: used momentum of legs dropping to lift trunk, assist to steady; pt returned to supine no assist, but S for safety/prevention  Transfers Overall transfer level: Needs assistance Equipment used: Standard walker   Sit to Stand: Min assist;From elevated surface         General transfer comment: several attempts unaided and unable to get weight shift forward without min A.  Ambulation/Gait Ambulation/Gait assistance: Min assist Ambulation Distance (Feet): 100 Feet (x 2 with seated rest) Assistive device: Rolling walker (2 wheeled) Gait Pattern/deviations: Step-through pattern;Decreased stride length;Trunk flexed;Drifts right/left     General Gait Details: assist to keep walker in straight path due to veering to L and running into objects in hallway; seated rest needed as pt c/o fatigue and impulsive to sit when noticing a chair   Stairs             Wheelchair Mobility    Modified Rankin (Stroke Patients Only)       Balance Overall balance assessment: Needs assistance Sitting-balance support: Feet supported Sitting balance-Leahy Scale: Good     Standing balance support: Bilateral upper extremity supported Standing balance-Leahy Scale: Poor Standing balance comment: reliant on external support from therapist for upright                            Cognition   Behavior During Therapy: [facility] for tasks assessed/performed Overall Cognitive Status: No family/caregiver present to determine baseline cognitive functioning                                 General Comments: perseverating on getting out side to see his car, then to receive package of instruments      Exercises      General Comments        Pertinent Vitals/Pain Pain Assessment: No/denies pain    Home Living                      Prior Function            PT Goals (current goals can now be found in the care plan section) Progress towards PT goals: Progressing toward goals    Frequency    Min 2X/week      PT Plan Current plan remains appropriate    Co-evaluation  AM-PAC PT "6 Clicks" Daily Activity  Outcome Measure  Difficulty turning over in bed (including adjusting bedclothes, sheets and blankets)?: A Little Difficulty moving from lying on back to sitting on the side of the bed? : A Little Difficulty sitting down on and standing up from a chair with arms (e.g., wheelchair, bedside commode, etc,.)?: Unable Help needed moving to and from a bed to chair (including a wheelchair)?: A Little Help needed walking in hospital room?: A Little Help needed climbing 3-5 steps with a railing? : A Lot 6 Click Score: 15    End of Session Equipment Utilized During Treatment: Gait belt Activity Tolerance: Patient tolerated treatment well Patient left: in bed;with call bell/phone within reach;with bed  alarm set;with nursing/sitter in room (telesitter)   PT Visit Diagnosis: Unsteadiness on feet (R26.81);Other abnormalities of gait and mobility (R26.89)     Time: 0413-6438 PT Time Calculation (min) (ACUTE ONLY): 29 min  Charges:  $Gait Training: 8-22 mins $Therapeutic Activity: 8-22 mins                    G CodesMagda Kiel, [place] [phone] 12/01/2016    Reginia Naas 12/01/2016, 4:19 PM

## 2016-12-01 NOTE — Progress Notes (Signed)
ABLE TO TURN WITH VERBAL CUE. MINIMAL ASSISTANCE NEEDED

## 2016-12-02 LAB — PROTIME-INR
INR: 2.55
PROTHROMBIN TIME: 27.2 s — AB (ref 11.4–15.2)

## 2016-12-02 MED ORDER — POLYETHYLENE GLYCOL 3350 17 G PO PACK: 17.0000 g | PACK | Freq: Every day | ORAL | 0 refills | Status: AC

## 2016-12-02 MED ORDER — DIGOXIN 62.5 MCG PO TABS: 0.0625 mg | ORAL_TABLET | Freq: Every day | ORAL | 0 refills | Status: AC

## 2016-12-02 MED ORDER — SENNOSIDES-DOCUSATE SODIUM 8.6-50 MG PO TABS: 1.0000 | ORAL_TABLET | Freq: Two times a day (BID) | ORAL | 0 refills | Status: AC

## 2016-12-02 MED ORDER — INFLUENZA VAC SPLIT HIGH-DOSE 0.5 ML IM SUSY: 0.5000 mL | PREFILLED_SYRINGE | INTRAMUSCULAR | Status: DC

## 2016-12-02 MED ORDER — TAMSULOSIN HCL 0.4 MG PO CAPS: 0.8000 mg | ORAL_CAPSULE | Freq: Every day | ORAL | 0 refills | Status: AC

## 2016-12-02 MED ORDER — FINASTERIDE 5 MG PO TABS: 5.0000 mg | ORAL_TABLET | Freq: Every day | ORAL | 0 refills | Status: AC

## 2016-12-02 MED ORDER — INFLUENZA VAC SPLIT HIGH-DOSE 0.5 ML IM SUSY
0.5000 mL | PREFILLED_SYRINGE | Freq: Once | INTRAMUSCULAR | Status: AC
  Administered 2016-12-02: 0.5 mL via INTRAMUSCULAR
  Filled 2016-12-02: qty 0.5

## 2016-12-02 NOTE — Progress Notes (Signed)
V/s reviewed on pt chart and O2 sats is noted to be 75% on RA. Upon reassessment, O2 sat = 99% while at bedside and visualized while NT was obtaining. Pt is without any noted s/s of respiratory distress at this time. Resting quietly/ easily aroused/ noted to have a telesitter available at bedside. A & O x 1 noted and is noted to perseverate on how coumadin works and is noted to become a bit agitated as he thought that his senna-s was coumadin which he states is "keeps my blood too thin." Pt educated administered medication and took in puree without any further concerns and requires both reeducation and redirection due to mentation. Bed in low position and slid against the left side of the wall. Skin intact with  An allevyn foam noted to buttock for prevention.skin on bilat lower exts are dry & flaky. callbell within reach. Safety maintained. Will continue to monitor.

## 2016-12-02 NOTE — Progress Notes (Signed)
Family Medicine Teaching Service Daily Progress Note Intern Pager: [phone]  Patient name: Juan Wilkerson Medical record number: [id number] Date of birth: [birth year] Age: 76 y.o. Gender: male  Primary Care Provider: Lovenia Kim, MD Consultants: Psychiatry Code Status: FULL   Pt Overview and Major Events to Date:  9/5 Admitted for AMS 9/6 Psychiatric assessment recommended inpatient placement (Geri-psych) 9/22 voiding trial failed  Assessment and Plan: Juan Wilkerson is a 76 y.o. male presenting with altered mental status. 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 Alert and oriented to self with confabulation. Not requiring mitten this am.  - Soft restraints, mittens, if agitated - Geodon 10mg  IM qhs prn agitation, aggression only. No more than 2 in 24 hours. - Awaiting inpatient Geri-psych placement or SNF  Anticoagulation for Mechanical aortic valve: INR therapeutic at 2.55 on 9/26. Goal range 2.5-3.5.  - Coumadin per pharmacy - monitor INR  Acute Urinary Retention: failed voiding trial, foley replaced on 9/23 - monitor I/O - flomax increased to 0.8 mg daily on 9/17 - continue finasteride - may require outpatient urology  Constipation, imrpoved: last BM 9/22 - continue scheduled miralax, senna-docusate  - monitor output  Atrial flutter/fibrillation without RVR: Rate controlled on digoxin.  - Anticoagulation as above. - Digoxin for rate control 0.0625 mg daily. D/c  On 09/31metoprolol - Monitor rate  Stage 1 sacral ulcer: Staff aware  - Monitor and continue to rotate in bed to relieve pressure of area  Hypertension, stable: Normotensive. - Now with no medications  Hyperlipidemia: On pravastatin at home. No tx at this time.  - discontinued due to risks vs benefits for long  term treatment with advanced demetia  Resolved L shoulder/ trapezius pain: Noted stable cyst that 1 in by 1 in on mid left back, mobile and most likely benign - tylenol prn for pain  Resolved AKI: Peak Cr 1.51 likely pre-renal from dehydration. SCr 0.76 on 09/25 - Monitor Cr  FEN/GI: Heart healthy diet VTE Prophylaxis: Coumadin  Disposition: Medically stable for Geri-psych placement or SNF  Subjective:   States feels well this morning, no concerns. Not requiring mittens. Reports that he is hungry and would like for me to raise the blinds so he can look out the window at the beautiful day. Happily demented.  Objective: Temp:  [97.7 F (36.5 C)-98.2 F (36.8 C)] 97.9 F (36.6 C) (09/26 0509) Pulse Rate:  [45-60] 60 (09/26 0509) Resp:  [17-18] 18 (09/26 0509) BP: (93-132)/(49-83) 132/83 (09/26 0509) SpO2:  [75 %-100 %] 96 % (09/26 0509)  Physical Exam: General: NAD, laying comfortably in bed, awake and alert HEENT: moist mucous membranes Cardiovascular: Irregular rhythm, normal rate, loud S2 Respiratory: CTAB, normal WOB Abdomen: soft, nontender, nondistended, + bowel sounds. Extremities: no LE edema. Neuro: Alert and oriented to self and place. Not oriented to time. Psych: speech with confabulation. No agitation.   Laboratory:  Recent Labs Lab 11/26/16 0406 11/28/16 0450 11/29/16 0524  WBC 6.4 9.5 9.3  HGB 15.0 15.0 15.0  HCT 46.3 45.0 45.3  PLT 156 134* 135*    Recent Labs Lab 12/01/16 0523  NA 137  K 3.6  CL 105  CO2 25  BUN 17  CREATININE 0.76  CALCIUM 8.9  GLUCOSE 95   INR: 2.55  Imaging/Diagnostic Tests: MRI 09/10: No acute intracranial abnormality. Small remote right posterior cortical and left cerebellar  Infarcts. Mild generalized cerebral atrophy with nonspecific cerebral white matter changes, mildly progressed relative to most recent MRI from 2015.  Juan Wilkerson, [place], DO 12/02/2016, 6:58 AM PGY-1, Reader Intern pager:  [phone], text pages welcome

## 2016-12-02 NOTE — Progress Notes (Signed)
Pt resting In bed quietly. Easily aroused. Alert to self noted and requires consistent redirection due to mentation. Safety maintained. Calth care provided. Will continue to monitor.

## 2016-12-02 NOTE — Progress Notes (Signed)
Per MD, discharge patient with Foley in place.

## 2016-12-02 NOTE — Progress Notes (Addendum)
2pm: Patient is able to discharge to [facility] today.  11am:[facility] liaison visiting patient to make sure they can accept him. CSW left patient's brother a voicemail yesterday and texted him today.   Percell Locus Cameka Rae LCSWA [phone]

## 2016-12-02 NOTE — Progress Notes (Addendum)
Patient will DC to: [facility] Anticipated DC date: 12/02/16 Family notified: Left message for brother and texted him, Janalyn Rouse aware of plan Transport by: [facility] 3:30pm   Per MD patient ready for DC to [facility]. RN, patient, patient's family, and facility notified of DC. Discharge Summary sent to facility. RN given number for report. DC packet on chart. Ambulance transport requested for patient.   CSW signing off.  Cedric Fishman, [place] Social Worker [phone]

## 2016-12-02 NOTE — Discharge Instructions (Signed)

## 2016-12-02 NOTE — Clinical Social Work Placement (Signed)
   CLINICAL SOCIAL WORK PLACEMENT  NOTE  Date:  12/02/2016  Patient Details  Name: Juan Wilkerson MRN: [id number] Date of Birth: 1940-04-04  Clinical Social Work is seeking post-discharge placement for this patient at the [place] level of care (*CSW will initial, date and re-position this form in  chart as items are completed):  Yes   Patient/family provided with [place] Work Department's list of facilities offering this level of care within the geographic area requested by the patient (or if unable, by the patient's family).  Yes   Patient/family informed of their freedom to choose among providers that offer the needed level of care, that participate in Medicare, Medicaid or managed care program needed by the patient, have an available bed and are willing to accept the patient.      Patient/family informed of [facility]'s ownership interest in [facility] and [facility], as well as of the fact that they are under no obligation to receive care at these facilities.  PASRR submitted to EDS on 11/26/16     PASRR number received on 11/26/16     Existing PASRR number confirmed on       FL2 transmitted to all facilities in geographic area requested by pt/family on 11/26/16     FL2 transmitted to all facilities within larger geographic area on       Patient informed that his/her managed care company has contracts with or will negotiate with certain facilities, including the following:        Yes   Patient/family informed of bed offers received.  Patient chooses bed at [facility]     Physician recommends and patient chooses bed at      Patient to be transferred to [facility] on 12/02/16.  Patient to be transferred to facility by PT[facility]     Patient family notified on 12/02/16 of transfer.  Name of family member notified:  Brother     PHYSICIAN       Additional Comment:     _______________________________________________ Benard Halsted, [place] 12/02/2016, 1:59 PM

## 2016-12-02 NOTE — Clinical Social Work Note (Signed)
Clinical Social Work Assessment  Patient Details  Name: Juan Wilkerson MRN: [id number] Date of Birth: September 20, 1940  Date of referral:  12/02/16               Reason for consult:  Facility Placement                Permission sought to share information with:  Facility Art therapist granted to share information::  No  Name::     Darla Lesches::  SNFs  Relationship::  Brother  Contact Information:  [phone]  Housing/Transportation Living arrangements for the past 2 months:  Apartment Source of Information:  Engineer, materials, Other (Comment Required) (Brother) Patient Interpreter Needed:  None Criminal Activity/Legal Involvement Pertinent to Current Situation/Hospitalization:  No - Comment as needed Significant Relationships:  Siblings Lives with:  Self Do you feel safe going back to the place where you live?  No Need for family participation in patient care:  Yes (Comment)  Care giving concerns:  CSW received consult for possible SNF placement at time of discharge. CSW spoke with patient's brother regarding PT recommendation of SNF placement at time of discharge. Patient's brother reported that patient lives alone in a duplex, which he is being evicted from. Brother lives in [place]. Patient has an estranged daughter only. Patient's brother expressed understanding of PT recommendation and is agreeable to SNF placement at time of discharge. CSW to continue to follow and assist with discharge planning needs.   Social Worker assessment / plan:  CSW spoke with patient's brother concerning possibility of rehab at [facility].  Employment status:  Retired Nurse, adult PT Recommendations:  [place] / Referral to community resources:  [place]  Patient/Family's Response to care:  Patient's brother recognizes need for rehab. CSW encouraged patient's brother to pursue Guardianship of his brother and provided  him with a Medicaid application for long term placement. Patient's brother aware that he has to sign patient in to SNF.  Patient/Family's Understanding of and Emotional Response to Diagnosis, Current Treatment, and Prognosis:  Patient/family is realistic regarding therapy needs and expressed being hopeful for SNF placement. Patient's brother expressed understanding of CSW role and discharge process as well as medical condition. No questions/concerns about plan or treatment.    Emotional Assessment Appearance:  Appears stated age Attitude/Demeanor/Rapport:  Unable to Assess Affect (typically observed):  Unable to Assess Orientation:   (Disoriented x4) Alcohol / Substance use:  Not Applicable Psych involvement (Current and /or in the community):  Yes (Comment) (Originally recommended for [employer], but determined not appropriate)  Discharge Needs  Concerns to be addressed:  Care Coordination Readmission within the last 30 days:  No Current discharge risk:  Cognitively Impaired Barriers to Discharge:  Continued Medical Work up   [employer], [place] 12/02/2016, 11:09 AM

## 2016-12-02 NOTE — Progress Notes (Signed)
Attempted to call report to [facility], no answer.

## 2016-12-02 NOTE — Progress Notes (Signed)
ANTICOAGULATION CONSULT NOTE - Follow Up Consult  Pharmacy Consult for warfarin and enoxaparin Indication: atrial fibrillation and mechanical AVR  Allergies  Allergen Reactions  . Lemon Oil Other (See Comments)    Only skin contact dermatitis   Patient Measurements: Height: 6\' 3"  (190.5 cm) Weight: 175 lb (79.4 kg) IBW/kg (Calculated) : 84.5   Vital Signs: Temp: 97.9 F (36.6 C) (09/26 0509) Temp Source: Axillary (09/26 0509) BP: 132/83 (09/26 0509) Pulse Rate: 60 (09/26 0509)  Labs:  Recent Labs  11/30/16 0559 12/01/16 0523 12/02/16 0418  LABPROT 20.6* 25.0* 27.2*  INR 1.79 2.29 2.55  CREATININE  --  0.76  --    Estimated Creatinine Clearance: 86.8 mL/min (by C-G formula based on SCr of 0.76 mg/dL).  Medications:  Scheduled:  . digoxin  0.0625 mg Oral Daily  . enoxaparin (LOVENOX) injection  80 mg Subcutaneous BID  . feeding supplement (ENSURE ENLIVE)  237 mL Oral BID BM  . feeding supplement (PRO-STAT SUGAR FREE 64)  30 mL Oral BID BM  . finasteride  5 mg Oral Daily  . mouth rinse  15 mL Mouth Rinse BID  . multivitamin with minerals  1 tablet Oral Daily  . polyethylene glycol  17 g Oral Daily  . senna-docusate  1 tablet Oral BID  . tamsulosin  0.8 mg Oral Daily  . Warfarin - Pharmacist Dosing Inpatient   Does not apply q1800   Assessment: 76 y/o male on warfarin PTA for Afib and mechanical AVR (INR goal 2.5-3.5), admitted with subtherapeutic INR likely due to noncompliance with AMS. Recent INRs have fluctuated above and below therapeutic goal. Patient appears sensitive to small changes in warfarin dose. CBC stable, WNL. No bleeding noted.    Today INR is therapeutic: 2.55   PTA warfarin dose: 3 mg daily except 1.5 mg T/Th/Sat  Goal of Therapy:  INR 2.5 - 3.5  Monitor platelets by anticoagulation protocol: Yes   Plan:  Warfarin 3mg  x1 today D/C Lovenox given therapeutic INR Daily INR/CBC Monitor s/sx of bleeding  Georga Bora, PharmD Clinical  Pharmacist 12/02/2016 8:22 AM

## 2016-12-21 NOTE — Progress Notes (Deleted)
12/23/2016 9:06 PM   Juan Wilkerson 03-22-1940 [id number]  Referring provider: Lovenia Kim, MD [place], [place] [address]  No chief complaint on file.   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    History of left renal oncocytic neoplasm S/p cryoablation on 02/19/2012 with Dr. Rodolph Bong for a left renal oncocytic neoplasm.  CTA of the chest, abdomen and pelvis performed on 03/06/2016.  Although protocoled as an arterial phase study, this clearly shows a stable renal ablation defect along the posterior aspect of the superior left kidney at the level of previously treated oncocytic neoplasm. There is no evidence of recurrent neoplasm. Other cysts in both kidneys have a stable appearance.  He is due for repeat study in 02/2017.    Urinary retention He was hospitalized for UTI with sepsis.  Patient was on tamsulosin 0.4 mg on admission.  He had a Foley placed due to retention.  Finasteride was added and he failed his TOV.  His tamsulosin was increased to 0.8 mg and he failed a second TOV.     PMH: Past Medical History:  Diagnosis Date  . Aortic aneurysm ([place]) 1994   From presumed Marfan's Syndrome.  Followed by Dr Stanford Breed.  Aneurysm involving the proximal descending thoracic aorta .    Marland Kitchen Atrial fibrillation ([place])    Followed by Dr Stanford Breed.  On coumadin.  . Atrial flutter ([place])    Followed by Dr Stanford Breed  . History of blood transfusion   . Hyperlipidemia   . Hypertension   . Marfan's syndrome with aortic dilation   . Osteoarthritis   . Pulmonary nodules    Found on chest CT 08/2007.  Monitoring with yearly CT.  3.35mm nodule in R iddle lobe.    Surgical History: Past Surgical History:  Procedure Laterality Date  . AORTIC VALVE REPLACEMENT  03/1993   St Jude Valve  . CARDIAC VALVE REPLACEMENT    . CORONARY ARTERY BYPASS GRAFT      . HIP FRACTURE SURGERY  03/1992   s/p R hip Fx  . IR GENERIC HISTORICAL  04/01/2016   IR RADIOLOGIST EVAL & MGMT 04/01/2016 Aletta Edouard, MD [facility] INTERV RAD  . KIDNEY SURGERY    . VASCULAR SURGERY    . Virtual Colonoscopy  11/01/2005   Pt on chronic coumadin for Aortic valve replacement and Atrial fibrilliation therefore at high risk if stopped.  Need to repeat every 5 yrs.     Home Medications:  Allergies as of 12/23/2016      Reactions   Lemon Oil Other (See Comments)   Only skin contact dermatitis      Medication List       Accurate as of 12/21/16  9:06 PM. Always use your most recent med list.          Digoxin 62.5 MCG Tabs Take 0.0625 mg by mouth daily.   finasteride 5 MG tablet Commonly known as:  PROSCAR Take 1 tablet (5 mg total) by mouth daily.   fluticasone 50 MCG/ACT nasal spray Commonly known as:  FLONASE Place 2 sprays into both nostrils daily.   loratadine 10 MG tablet Commonly known as:  CLARITIN Take 10 mg by mouth daily.   multivitamin capsule Take 1 capsule by mouth every morning.   polyethylene glycol packet Commonly known as:  MIRALAX / GLYCOLAX Take  17 g by mouth daily.   senna-docusate 8.6-50 MG tablet Commonly known as:  Senokot-S Take 1 tablet by mouth 2 (two) times daily.   tamsulosin 0.4 MG Caps capsule Commonly known as:  FLOMAX Take 2 capsules (0.8 mg total) by mouth daily.   warfarin 3 MG tablet Commonly known as:  COUMADIN TAKE 1 TABLET BY MOUTH EVERY DAY BUT SATURDAY ON SATURDAY TAKE 1 & 1/2 TABLET       Allergies:  Allergies  Allergen Reactions  . Lemon Oil Other (See Comments)    Only skin contact dermatitis    Family History: Family History  Problem Relation Age of Onset  . Cancer Brother   . Hypertension Brother   . Aortic aneurysm Maternal Aunt     Social History:  reports that he has never smoked. He has never used smokeless tobacco. He reports that he does not drink alcohol or use drugs.  ROS:                                         Physical Exam: There were no vitals taken for this visit.  Constitutional: Well nourished. Alert and oriented, No acute distress. HEENT: [place] AT, moist mucus membranes. Trachea midline, no masses. Cardiovascular: No clubbing, cyanosis, or edema. Respiratory: Normal respiratory effort, no increased work of breathing. GI: Abdomen is soft, non tender, non distended, no abdominal masses. Liver and spleen not palpable.  No hernias appreciated.  Stool sample for occult testing is not indicated.   GU: No CVA tenderness.  No bladder fullness or masses.  Patient with circumcised/uncircumcised phallus. ***Foreskin easily retracted***  Urethral meatus is patent.  No penile discharge. No penile lesions or rashes. Scrotum without lesions, cysts, rashes and/or edema.  Testicles are located scrotally bilaterally. No masses are appreciated in the testicles. Left and right epididymis are normal. Rectal: Patient with  normal sphincter tone. Anus and perineum without scarring or rashes. No rectal masses are appreciated. Prostate is approximately *** grams, *** nodules are appreciated. Seminal vesicles are normal. Skin: No rashes, bruises or suspicious lesions. Lymph: No cervical or inguinal adenopathy. Neurologic: Grossly intact, no focal deficits, moving all 4 extremities. Psychiatric: Normal mood and affect.  Laboratory Data: Lab Results  Component Value Date   WBC 9.3 11/29/2016   HGB 15.0 11/29/2016   HCT 45.3 11/29/2016   MCV 89.9 11/29/2016   PLT 135 (L) 11/29/2016    Lab Results  Component Value Date   CREATININE 0.76 12/01/2016    Lab Results  Component Value Date   PSA 1.96 04/17/2009   PSA 1.30 03/06/2008    Lab Results  Component Value Date   TSH 1.491 11/12/2016       Component Value Date/Time   CHOL 159 11/12/2016 0133   HDL 41 11/12/2016 0133   CHOLHDL 3.9 11/12/2016 0133   VLDL 26 11/12/2016 0133   LDLCALC 92  11/12/2016 0133    Lab Results  Component Value Date   AST 23 11/17/2016   Lab Results  Component Value Date   ALT 19 11/17/2016    I have reviewed the labs.   Assessment & Plan:  ***  1. Urinary retention:     - foley catheter removed  - voiding trial today    - continue tamsulosin 0.8 mg and finasteride 5 mg daily; refill  - return if unable to urinate or experiencing suprapubic discomfort  -  follow-up in one month for I PSS score, PVR and exam.  2. Renal oncocytoma of left kidney  - followed by Dr. Rodolph Bong   No Follow-up on file.  These notes generated with voice recognition software. I apologize for typographical errors.  Zara Council, [facility] [address] [place], [place] [address] [phone]

## 2016-12-23 ENCOUNTER — Ambulatory Visit: Payer: Self-pay | Admitting: Urology

## 2017-01-06 ENCOUNTER — Other Ambulatory Visit: Payer: Self-pay | Admitting: Cardiology

## 2017-01-06 ENCOUNTER — Ambulatory Visit: Payer: Self-pay | Admitting: Urology

## 2017-03-10 ENCOUNTER — Telehealth: Payer: Self-pay | Admitting: Cardiology

## 2017-03-10 ENCOUNTER — Other Ambulatory Visit (HOSPITAL_COMMUNITY): Payer: Self-pay | Admitting: Interventional Radiology

## 2017-03-10 DIAGNOSIS — I712 Thoracic aortic aneurysm, without rupture, unspecified: Secondary | ICD-10-CM

## 2017-03-10 DIAGNOSIS — N2889 Other specified disorders of kidney and ureter: Secondary | ICD-10-CM

## 2017-03-10 NOTE — Telephone Encounter (Signed)
Left message for henri to call

## 2017-03-10 NOTE — Telephone Encounter (Signed)
Spoke with Juan Wilkerson, patient is due for a CTA chest to follow up thoracic aortic aneurysm, he is also due for other testing and they will try to do both at the same time. She will contact the patient to schedule.

## 2017-03-10 NOTE — Telephone Encounter (Signed)
New Message  Henri from [place] call requesting to speak with RN about pts upcoming CT in their office. Please call back to discuss

## 2017-03-16 ENCOUNTER — Telehealth: Payer: Self-pay | Admitting: Radiology

## 2017-03-16 NOTE — Telephone Encounter (Signed)
Returned call to Ardath Sax who states patient is currently residing at [facility] after hospitalization in September for [place].   Per facility, patient is not capable of completing scans at this time due to altered mental status.   Henri states they will not be trying to get him in for CT at this time and wanted to make sure we were aware about the CTA that was needed as well.  Reports patient has no family that is actively involved in care, therefore consent would not be able to be obtained from patient or patient able to hold still for the scan.   Advised Hilda Blades is OOO this week but would make her aware.  Henri states she can call to speak with her if she has any additional questions.

## 2017-03-16 NOTE — Telephone Encounter (Signed)
Call to [facility] to schedule follow up appointment w/ Dr Kathlene Cote.  Spoke with staff member, [email or web address].  She states that patient continues to show signs of dementia.  The staff there has made multiple attempts to contact family but has not received any response.  She states that it would be difficult for patient to cooperate with imaging, etc.    Notified Dr Kathlene Cote of the above info.  Follow up will be deferred at this time per Dr Kathlene Cote.  Peri Kreft Riki Rusk, RN 03/16/2017 1:45 PM

## 2017-03-16 NOTE — Telephone Encounter (Signed)
F/u Message  Henri call to f/u with RN.

## 2017-05-07 DEATH — deceased
